# Patient Record
Sex: Male | Born: 1950 | Race: White | Hispanic: No | Marital: Married | Smoking: Former smoker
Health system: Southern US, Community
[De-identification: ages and names within clinical notes are randomized; demographics above are authoritative.]

## PROBLEM LIST (undated history)

## (undated) DIAGNOSIS — A770 Spotted fever due to Rickettsia rickettsii: Secondary | ICD-10-CM

## (undated) DIAGNOSIS — E785 Hyperlipidemia, unspecified: Secondary | ICD-10-CM

## (undated) DIAGNOSIS — R011 Cardiac murmur, unspecified: Secondary | ICD-10-CM

## (undated) DIAGNOSIS — D649 Anemia, unspecified: Secondary | ICD-10-CM

## (undated) DIAGNOSIS — K5792 Diverticulitis of intestine, part unspecified, without perforation or abscess without bleeding: Secondary | ICD-10-CM

## (undated) DIAGNOSIS — I499 Cardiac arrhythmia, unspecified: Secondary | ICD-10-CM

## (undated) DIAGNOSIS — I10 Essential (primary) hypertension: Secondary | ICD-10-CM

## (undated) DIAGNOSIS — D126 Benign neoplasm of colon, unspecified: Secondary | ICD-10-CM

## (undated) DIAGNOSIS — K5790 Diverticulosis of intestine, part unspecified, without perforation or abscess without bleeding: Secondary | ICD-10-CM

## (undated) DIAGNOSIS — K4090 Unilateral inguinal hernia, without obstruction or gangrene, not specified as recurrent: Secondary | ICD-10-CM

## (undated) HISTORY — DX: Anemia, unspecified: D64.9

## (undated) HISTORY — DX: Benign neoplasm of colon, unspecified: D12.6

## (undated) HISTORY — DX: Spotted fever due to Rickettsia rickettsii: A77.0

## (undated) HISTORY — DX: Cardiac murmur, unspecified: R01.1

## (undated) HISTORY — DX: Essential (primary) hypertension: I10

## (undated) HISTORY — DX: Diverticulitis of intestine, part unspecified, without perforation or abscess without bleeding: K57.92

## (undated) HISTORY — PX: THROAT SURGERY: SHX803

## (undated) HISTORY — DX: Diverticulosis of intestine, part unspecified, without perforation or abscess without bleeding: K57.90

## (undated) HISTORY — DX: Hyperlipidemia, unspecified: E78.5

## (undated) HISTORY — DX: Cardiac arrhythmia, unspecified: I49.9

## (undated) HISTORY — PX: VASECTOMY: SHX75

## (undated) HISTORY — DX: Unilateral inguinal hernia, without obstruction or gangrene, not specified as recurrent: K40.90

---

## 1997-11-21 DIAGNOSIS — I480 Paroxysmal atrial fibrillation: Secondary | ICD-10-CM

## 1997-11-21 HISTORY — DX: Paroxysmal atrial fibrillation: I48.0

## 2002-11-21 HISTORY — PX: OTHER SURGICAL HISTORY: SHX169

## 2003-04-12 ENCOUNTER — Inpatient Hospital Stay (HOSPITAL_COMMUNITY): Admission: AD | Admit: 2003-04-12 | Discharge: 2003-04-13 | Payer: Self-pay | Admitting: Cardiology

## 2003-04-12 ENCOUNTER — Encounter: Payer: Self-pay | Admitting: Cardiology

## 2003-11-22 HISTORY — PX: HEMORRHOID SURGERY: SHX153

## 2004-05-21 DIAGNOSIS — A770 Spotted fever due to Rickettsia rickettsii: Secondary | ICD-10-CM

## 2004-05-21 HISTORY — DX: Spotted fever due to Rickettsia rickettsii: A77.0

## 2004-07-12 ENCOUNTER — Ambulatory Visit (HOSPITAL_COMMUNITY): Admission: RE | Admit: 2004-07-12 | Discharge: 2004-07-12 | Payer: Self-pay | Admitting: Internal Medicine

## 2004-10-27 ENCOUNTER — Encounter (INDEPENDENT_AMBULATORY_CARE_PROVIDER_SITE_OTHER): Payer: Self-pay | Admitting: *Deleted

## 2004-10-27 ENCOUNTER — Ambulatory Visit (HOSPITAL_COMMUNITY): Admission: RE | Admit: 2004-10-27 | Discharge: 2004-10-27 | Payer: Self-pay | Admitting: *Deleted

## 2009-11-10 LAB — HEPATIC FUNCTION PANEL: AST: 17 U/L (ref 14–40)

## 2009-11-10 LAB — LIPID PANEL: Cholesterol: 193 mg/dL (ref 0–200)

## 2010-09-29 ENCOUNTER — Encounter: Admission: RE | Admit: 2010-09-29 | Discharge: 2010-09-29 | Payer: Self-pay | Admitting: Emergency Medicine

## 2010-10-11 ENCOUNTER — Encounter (INDEPENDENT_AMBULATORY_CARE_PROVIDER_SITE_OTHER): Payer: Self-pay | Admitting: *Deleted

## 2010-11-10 LAB — BASIC METABOLIC PANEL: Creatinine: 0.9 mg/dL (ref <=1.3)

## 2010-11-10 LAB — HEPATIC FUNCTION PANEL: ALT: 20 U/L (ref 10–40)

## 2010-11-17 ENCOUNTER — Encounter (INDEPENDENT_AMBULATORY_CARE_PROVIDER_SITE_OTHER): Payer: Self-pay | Admitting: *Deleted

## 2010-11-18 ENCOUNTER — Ambulatory Visit: Payer: Self-pay | Admitting: Internal Medicine

## 2010-12-02 ENCOUNTER — Ambulatory Visit
Admission: RE | Admit: 2010-12-02 | Discharge: 2010-12-02 | Payer: Self-pay | Source: Home / Self Care | Attending: Internal Medicine | Admitting: Internal Medicine

## 2010-12-02 ENCOUNTER — Encounter: Payer: Self-pay | Admitting: Internal Medicine

## 2010-12-02 LAB — HM COLONOSCOPY

## 2010-12-07 ENCOUNTER — Encounter: Payer: Self-pay | Admitting: Internal Medicine

## 2010-12-21 NOTE — Letter (Signed)
Summary: Pre Visit Letter Revised  [facility]  [address] [place], [place] [address]   Phone: [phone]  Fax: [phone]        10/11/2010 MRN: [id number] [facility] [address] [place], [place]  [address]             Procedure Date:  12-02-10   Welcome to the Gastroenterology Division at [facility].    You are scheduled to see a nurse for your pre-procedure visit on 11-18-10 at 8:00a.m. on the 3rd floor at [facility], [address]  We ask that you try to arrive at our office 15 minutes prior to your appointment time to allow for check-in.  Please take a minute to review the attached form.  If you answer "Yes" to one or more of the questions on the first page, we ask that you call the person listed at your earliest opportunity.  If you answer "No" to all of the questions, please complete the rest of the form and bring it to your appointment.    Your nurse visit will consist of discussing your medical and surgical history, your immediate family medical history, and your medications.   If you are unable to list all of your medications on the form, please bring the medication bottles to your appointment and we will list them.  We will need to be aware of both prescribed and over the counter drugs.  We will need to know exact dosage information as well.    Please be prepared to read and sign documents such as consent forms, a financial agreement, and acknowledgement forms.  If necessary, and with your consent, a friend or relative is welcome to sit-in on the nurse visit with you.  Please bring your insurance card so that we may make a copy of it.  If your insurance requires a referral to see a specialist, please bring your referral form from your primary care physician.  No co-pay is required for this nurse visit.     If you cannot keep your appointment, please call [phone] to cancel or reschedule prior to your appointment date.   This allows [place] the opportunity to schedule an appointment for another patient in need of care.    Thank you for choosing [facility] for your medical needs.  We appreciate the opportunity to care for you.  Please visit [place] at our website  to learn more about our practice.  Sincerely, The Gastroenterology Division

## 2010-12-23 NOTE — Letter (Signed)
Summary: [name] Instructions  [facility]  [address] [place], [place] [address]   Phone: [phone]  Fax: [phone]       Alec Wilson    [birth year]    MRN: [id number]        Procedure Day [name]:  [name]  [birth year]     Arrival Time:  9:30AM     Procedure Time:  10:30AM     [facility] of Procedure:                    [name]                      PREPARATION FOR COLONOSCOPY WITH MOVIPREP   Starting 5 days prior to your procedure 11/27/10 do not eat nuts, seeds, popcorn, corn, beans, peas,  salads, or any raw vegetables.  Do not take any fiber supplements (e.g. Metamucil, Citrucel, and Benefiber).  THE DAY BEFORE YOUR PROCEDURE         DATE: 12/01/10  DAY: WEDNESDAY  1.  Drink clear liquids the entire day-NO SOLID FOOD  2.  Do not drink anything colored red or purple.  Avoid juices with pulp.  No orange juice.  3.  Drink at least 64 oz. (8 glasses) of fluid/clear liquids during the day to prevent dehydration and help the prep work efficiently.  CLEAR LIQUIDS INCLUDE: Water Jello Ice Popsicles Tea (sugar ok, no milk/cream) Powdered fruit flavored drinks Coffee (sugar ok, no milk/cream) Gatorade Juice: apple, white grape, white cranberry  Lemonade Clear bullion, consomm, broth Carbonated beverages (any kind) Strained chicken noodle soup Hard Candy                             4.  In the morning, mix first dose of MoviPrep solution:    Empty 58 Pouch A and 58 Pouch B into the disposable container    Add lukewarm drinking water to the top line of the container. Mix to dissolve    Refrigerate (mixed solution should be used within 24 hrs)  5.  Begin drinking the prep at 5:00 p.m. The MoviPrep container is divided by 4 marks.   Every 15 minutes drink the solution down to the next mark (approximately 8 oz) until the full liter is complete.   6.  Follow completed prep with 16 oz of clear liquid of your choice (Nothing  red or purple).  Continue to drink clear liquids until bedtime.  7.  Before going to bed, mix second dose of MoviPrep solution:    Empty 58 Pouch A and 58 Pouch B into the disposable container    Add lukewarm drinking water to the top line of the container. Mix to dissolve    Refrigerate  THE DAY OF YOUR PROCEDURE      DATE: 12/02/10    DAY: THURSDAY  Beginning at 5:30AM (5 hours before procedure):         1. Every 15 minutes, drink the solution down to the next mark (approx 8 oz) until the full liter is complete.  2. Follow completed prep with 16 oz. of clear liquid of your choice.    3. You may drink clear liquids until 8:30AM (2 HOURS BEFORE PROCEDURE).   MEDICATION INSTRUCTIONS  Unless otherwise instructed, you should take regular prescription medications with a small sip of water   as early as possible the morning  of your procedure.        OTHER INSTRUCTIONS  You will need a responsible adult at least 58 years of age to accompany you and drive you home.   This person must remain in the waiting room during your procedure.  Wear loose fitting clothing that is easily removed.  Leave jewelry and other valuables at home.  However, you may wish to bring a book to read or  an iPod/MP3 player to listen to music as you wait for your procedure to start.  Remove all body piercing jewelry and leave at home.  Total time from sign-in until discharge is approximately 2-3 hours.  You should go home directly after your procedure and rest.  You can resume normal activities the  day after your procedure.  The day of your procedure you should not:   Drive   Make legal decisions   Operate machinery   Drink alcohol   Return to work  You will receive specific instructions about eating, activities and medications before you leave.    The above instructions have been reviewed and explained to me by  [name]  November 18, 2010 8:07 AM     I fully understand  and can verbalize these instructions _____________________________ Date _________

## 2010-12-23 NOTE — Miscellaneous (Signed)
Summary: [facility] previsit  Clinical Lists Changes  Medications: Added new medication of MOVIPREP 100 GM  SOLR (PEG-KCL-NACL-NASULF-NA ASC-C) As per prep instructions. - Signed Rx of MOVIPREP 100 GM  SOLR (PEG-KCL-NACL-NASULF-NA ASC-C) As per prep instructions.;  #1 x 0;  Signed;  Entered by: [name];  Authorized by: Iva Boop MD, FACG;  Method used: Electron[facility] to [facility]  [address] [place], [place]  [address], Ph: [phone] or [phone], Fax: [phone] Observations: Added new observation of NKA: T (11/18/2010 7:48)    Prescriptions: MOVIPREP 100 GM  SOLR (PEG-KCL-NACL-NASULF-NA ASC-C) As per prep instructions.  #1 x 0   Entered by:   [name]   Authorized by:   Iva Boop MD, [facility]   Signed by:   [name] on 11/18/2010   Method used:   [facility]ronically to        [facility]  [address] (retail)       [address]       [place], [place]  [address]       Ph: [phone] or [phone]       Fax: [phone]   RxID:   [id number]

## 2010-12-23 NOTE — Letter (Signed)
Summary: Patient Notice- Polyp Results  [facility]  [address] [place], [place] [address]   Phone: [phone]  Fax: [phone]        December 07, 2010 MRN: [id number]    [facility] [address] [place], [place]  [address]    Dear Mr. Buchmann,  The polyps removed from your colon were adenomatous. This means that they were pre-cancerous or that  they had the potential to change into cancer over time.   I recommend that you have a repeat colonoscopy in 3 years to determine if you have developed any new polyps over time and screen for colorectal cancer. If you develop any new rectal bleeding, abdominal pain or significant bowel habit changes, please contact us before then.  In addition to repeating colonoscopy, changing health habits may reduce your risk of having more colon or rectal  polyps and possibly, colorectal cancer. You may lower your risk of future polyps and colorectal cancer by adopting healthy habits such as not smoking or using tobacco (if you do), being physically active, losing weight (if overweight), and eating a diet which includes fruits and vegetables and limits red meat.  Please call us if you are having persistent problems or have questions about your condition that have not been fully answered at this time.  Sincerely,  Iva Boop MD, [facility]  This letter has been electronically signed by your physician.  Appended Document: Patient Notice- Polyp Results Letter mailed

## 2010-12-23 NOTE — Procedures (Signed)
Summary: Colonoscopy  Patient: Alec Wilson Note: All result statuses are Final unless otherwise noted.  Tests: (1) Colonoscopy (COL)   COL Colonoscopy           DONE     [facility]     [address]     [place], [place]  [address]           COLONOSCOPY PROCEDURE REPORT           PATIENT:  Alec, Wilson  MR#:  [id number]     BIRTHDATE:  05-22-1951, 59 yrs. old  GENDER:  male     ENDOSCOPIST:  Iva Boop, MD, [facility]           PROCEDURE DATE:  12/02/2010     PROCEDURE:  Colonoscopy with snare polypectomy     ASA CLASS:  Class II     INDICATIONS:  surveillance and high-risk screening, history of     pre-cancerous (adenomatous) colon polyps Two small adenomas (max     6mm) removed 2004     MEDICATIONS:   Fentanyl 75 mcg IV, Versed 8 mg           DESCRIPTION OF PROCEDURE:   After the risks benefits and     alternatives of the procedure were thoroughly explained, informed     consent was obtained.  Digital rectal exam was performed and     revealed no abnormalities and normal prostate.   The [id number] [id number]     endoscope was introduced through the anus and advanced to the     cecum, which was identified by both the appendix and ileocecal     valve, without limitations.  The quality of the prep was     excellent, using MoviPrep.  The instrument was then slowly     withdrawn as the colon was fully examined. Insertion: 2:17 minutes     Withdrawal: 15:49 minutes     <<PROCEDUREIMAGES>>           FINDINGS:  Five polyps were found. Four right colon polyps (all     diminutive) and one sigmoid (7mm) polyp. Right colon polyps were     snared without cautery. Retrieval was successful. the sigmoid     polyp was snared, then cauterized with monopolar cautery.     Retrieval was successful. Mild diverticulosis was found in the     sigmoid colon.  Otherwise normal colonoscopy. Includes right colon     retroflexion.   Retroflexed views in the rectum revealed internal     and external  hemorrhoids.    The scope was then withdrawn from the     patient and the procedure completed.           COMPLICATIONS:  None     ENDOSCOPIC IMPRESSION:     1) Five polyps removed, largest 7mm     2) Mild diverticulosis in the sigmoid colon     3) Otherwise normal colon     4) Internal and external hemorrhoids     5) Personal history small adenoma (2) removal 2004.           RECOMMENDATIONS:  1) No aspirin or anti-inflammatory medications     for 1 week.           REPEAT EXAM:  In for Colonoscopy, pending biopsy results. Will     notify.           Iva Boop, MD, [facility]  CC:  The Patient     Lesle Chris, MD           n.     eSIGNED:   Iva Boop at 12/02/2010 11:12 AM           [place], [place], [place] [id number]  Note: An exclamation mark (!) indicates a result that was not dispersed into the flowsheet. Document Creation Date: 12/02/2010 11:12 AM _______________________________________________________________________  (1) Order result status: Final Collection or observation date-time: 12/02/2010 11:02 Requested date-time:  Receipt date-time:  Reported date-time:  Referring Physician:   Ordering Physician: Stan Head [phone]) Specimen Source:  Source: Launa Grill Order Number: [id number] Lab site:   Appended Document: Colonoscopy     Procedures Next Due Date:    Colonoscopy: 11/2013

## 2011-04-08 NOTE — Op Note (Signed)
NAME:  Alec Wilson, Alec Wilson NO.:  [id number]   MEDICAL RECORD NO.:  [id number]                   PATIENT TYPE:  AMB   LOCATION:  ENDO                                 FACILITY:  [facility]   PHYSICIAN:  Iva Boop, M.D. [facility]           DATE OF BIRTH:  [birth year]   DATE OF PROCEDURE:  07/12/2004  DATE OF DISCHARGE:                                 OPERATIVE REPORT   PROCEDURE:  1. Esophagogastroduodenoscopy.  2. Flexible sigmoidoscopy.  3. Hemorrhoidal ligation.   PREOPERATIVE DIAGNOSES:  Iron deficiency anemia, with chronic intermittent  rectal bleeding/hematochezia.   POSTOPERATIVE DIAGNOSES:  1. Normal upper gastrointestinal endoscopy from the esophagus to the second     portion of the duodenum, without cause of anemia.  2. Grade 3 internal hemorrhoids, otherwise normal sigmoidoscopy examination     to 20 cm.   RECOMMENDATIONS/PLAN:  1. I will see him back in about three weeks, after his hemorrhoidal     ligation.  2. Sitz baths, Colace, Vicodin and lidocaine jelly as needed for     hemorrhoidal and post-banding care.  3. He is to call if there is fever, bleeding, or severe pain.  4. If this fails to improve things, I think surgical treatment of these     hemorrhoids would be appropriate.   DESCRIPTION OF PROCEDURE:  After an informed consent, the patient was  brought to the endoscopy laboratory.  He was sedated with 100 mcg of  fentanyl and 10 mg of Versed before and during his EGD.  Conscious sedation  protocol was followed.  The Olympus adult video gastroscope was inserted  into the esophagus, stomach and first and second portions of the duodenum.  He was in the left lateral decubitus position.  Topical Cetacaine spray was  applied to the pharynx.  All of these areas inspected were normal.  The  gastroscope was withdrawn.  The patient was turned and a flexible  sigmoidoscopy was performed to 20 cm with the same scope.  A rectal  examination  revealed the prolapsed hemorrhoids.  Photographs were taken.  The dentate line was identified.  A retroflexed examination was performed  with the six-shooter ligating apparatus applied and the three columns of  hemorrhoids were ligated successfully (three bands placed).  There was a  small amount of bleeding, which was  typical.  This was traumatic, but it was minor.  The scope was withdrawn and  there were no immediate complications.  I appreciate the opportunity to care for this patient.                                               Iva Boop, M.D. [facility]    CEG/MEDQ  D:  07/12/2004  T:  07/12/2004  Job:  [id number]  cc:   Stan Head. Cleta Alberts, M.D.  [address]  [place]  [place] [address]  Fax: [phone]

## 2011-04-08 NOTE — Op Note (Signed)
NAMEXAVION, Alec Wilson                ACCOUNT NO.:  [id number]   MEDICAL RECORD NO.:  [id number]          PATIENT TYPE:  AMB   LOCATION:  DAY                          [facility]ILITY[facility]   PHYSICIAN:  Vikki Ports, MDDATE OF BIRTH:  1951-07-03   DATE OF PROCEDURE:  10/27/2004  DATE OF DISCHARGE:                                 OPERATIVE REPORT   PREOPERATIVE DIAGNOSES:  Grade 3 internal hemorrhoids with prolapse.   POSTOPERATIVE DIAGNOSES:  Grade 3 internal hemorrhoids with prolapse.   PROCEDURE:  Procedure for prolapsing hemorrhoids, anopexy, rectopexy.   SURGEON:  Vikki Ports, MD.   ANESTHESIA:  General.   DESCRIPTION OF PROCEDURE:  The patient was taken to the operating room,  placed in the supine position after adequate general anesthesia was induced  using the endotracheal tube, the patient was placed in the prone jackknife  position. Rectal and perianal prep were undertaken.  The three hemorrhoidal  bundles were injected with 0.5 Marcaine and Wydase.  Internal and external  sphincter muscles were injected with an additional 25 mL of 0.25% Marcaine.  Anal dilatation was accomplished to three fingers. A 2-0 Prolene pursestring  suture was placed in the mucosa in a circumferential fashion 5 cm proximal  to the dentate line. The stapler was then placed and the pursestring suture  cinched down around it. The stapler was held in place for 30 seconds after  it had been closed, fired and removed.  I had a rim of approximately 1 1/2  to 2 cm of hemorrhoidal tissue. There was no evidence of muscularis. The  staple line was tediously inspected, there was no bleeding, Gelfoam packing  was placed, patient tolerated the procedure well and was taken to PACU in  good condition.     [name]   KRH/MEDQ  D:  10/27/2004  T:  10/27/2004  Job:  [id number]

## 2011-04-08 NOTE — Discharge Summary (Signed)
Alec Wilson, BROTHERS NO.:  [id number]   MEDICAL RECORD NO.:  [id number]                   PATIENT TYPE:  INP   LOCATION:  2019                                 FACILITY:  [facility]   PHYSICIAN:  Darden Palmer., M.D.         DATE OF BIRTH:  [birth year]   DATE OF ADMISSION:  04/12/2003  DATE OF DISCHARGE:  04/13/2003                                 DISCHARGE SUMMARY   FINAL DIAGNOSES:  1. Chest pain; myocardial infarction ruled out.  2. Abdominal discomfort possible hiatal hernia.  3. Elevation of the glucose without previous diagnosis of diabetes.   HISTORY:  A 60 year old male, had previously been healthy except for a  history of paroxysmal atrial fibrillation and possible mitral valve  prolapse.  He had the onset of dyspnea and mild chest tightness after  walking with his wife.  His symptoms persisted, were associated with some  numbness and tingling of his left arm.  He was seen at Urgent Care and a GI  cocktail was given with some improvement in his symptoms but they recurred  and he was sent here for admission.  A colleague at work had an infarction  earlier in the week which had been of some concern to him.  He previously  had a negative Cardiolite one year ago.  He was admitted to rule out a  myocardial infarction.  Please see the previously dictated History and  Physical for remainder of the details.   HOSPITAL COURSE:  On admission, his initial CPK was 221 but negative MB or  troponin and subsequent CPKs and troponins were all normal.  Chemistry panel  showed a sodium of 137, potassium 4.7, and glucose of 151 which was  nonfasting, BUN of 12, and creatinine of 1.1.  Liver enzymes were normal.  Hemoglobin was 12.6, white count 6000, hematocrit 37.9.  Lipid panel was  drawn but was pending at the time of dictation.  His EKG was normal and a  repeat EKG the next morning was normal.  He was treated with Protonix, beta-  blockers, aspirin,  and Lovenox.  His symptoms resolved and he had no  recurrence of pain overnight and was ambulatory the next day.  He was opted  a discharge to home the next day to continue his workup as an outpatient and  about whether Dr. Deborah Chalk would want to do a catheterization or do another  Cardiolite study on him.   DISPOSITION:  He is discharged at this time in improved condition on aspirin  325 mg daily, Protonix 40 mg daily, Zestril 5 mg daily, nitroglycerin 1/150  sublingual p.r.n.    DISCHARGE INSTRUCTIONS:  He is to limit his activities and is to call Dr.  Deborah Chalk in the morning for an appointment early in the week.  Darden Palmer., M.D.    WST/MEDQ  D:  04/13/2003  T:  04/13/2003  Job:  [id number]   cc:   Colleen Can. Deborah Chalk, M.D.  [address]  [place]  [place] [address]  Fax: [phone]   Stan Head. Cleta Alberts, M.D.  [address]  [place]  [place] [address]  Fax: [phone]

## 2011-04-08 NOTE — H&P (Signed)
NAMEQUANTAY, Alec Wilson NO.:  [id number]   MEDICAL RECORD NO.:  [id number]                   PATIENT TYPE:  INP   LOCATION:  2019                                 FACILITY:  [facility]   PHYSICIAN:  W. Ashley Royalty., M.D.         DATE OF BIRTH:  [birth year]   DATE OF ADMISSION:  04/12/2003  DATE OF DISCHARGE:                                HISTORY & PHYSICAL   HISTORY OF PRESENT ILLNESS:  The patient is a 60 year old male admitted to  room 2019 at [facility] for evaluation of chest discomfort.  The patient  has a prior history of paroxysmal atrial fibrillation and of mitral valve  prolapse and has been followed intermittently by Dr. Deborah Chalk.  He describes  having had a negative Cardiolite scan approximately one year ago.  He was in  his usual state of health and had taken a run yesterday.  This morning he  awakened and went on a walk with his wife but developed mild heaviness and  pressure in his chest associated with increased shortness of breath and  turned around after walking a short distance.  He then continued to have  intermittent pressure and heaviness associated with numbness and tingling in  his arm and went to see the physicians at [facility].  He was given  a GI cocktail, which may have had transient relief, but had recurrence of  pain and also had some dyspepsia at that time.  He has been battling an  upset stomach through the morning.  He continues to complain of some mild  pressure and heaviness and was admitted to the hospital to rule out a  myocardial infarction.  The discomfort has been low-grade, not necessarily  exertionally-related.  He has no associated symptoms of sweating or nausea.  He has not had previous symptoms like this previously.  Denies any  palpitations.   PAST MEDICAL HISTORY:  Remarkable for hypertension.  Previously his  cholesterol was normally low.  He denies any diabetes.   PAST SURGICAL HISTORY:   Throat surgery as a child.   ALLERGIES:  None.   MEDICATIONS:  1. Lisinopril 5 mg daily.  2. Aspirin 81 mg daily.   FAMILY HISTORY:  Mother is 8 and healthy.  Father is 73 and had bypass  grafting in his 81s and had an MI just two years ago.  He has a brother who  has hypertension.  There is no significant other family history of heart  disease other than his father.   SOCIAL HISTORY:  He is a Clinical research associate for [employer].  He and his  wife have been married for over 20 years and have a 5 year old son.  He is  a nonsmoker, drinks rare wine.   REVIEW OF SYSTEMS:  His weight has been stable.  He normally is active,  healthy, and works out.  He denies any eye, ear, nose,  or throat problems.  He denies any pulmonary problems, denies any GU problems.  He normally does  not have dyspepsia or indigestion.  He has occasional left knee arthritis  from where he has run.  He denies any edema or neurologic symptoms.  Other  than as noted above, the remainder of the review of systems is unremarkable.   PHYSICAL EXAMINATION:  GENERAL:  He is a pleasant white male who is in no  acute distress.  VITAL SIGNS:  Blood pressure is currently 140/85, pulse is currently 60.  SKIN:  Warm and dry.  HEENT:  ENT shows him to be balding.  EOMI, PERRLA, CNS clear.  Fundi not  examined.  Pharynx negative.  NECK:  Supple without masses or JVD, thyromegaly, or bruits.  CHEST:  Lungs clear to A&P.  CARDIAC:  Normal S1, S2, no S3 or S4.  I did not appreciate a definite  murmur or click.  ABDOMEN:  Soft and nontender, no masses, hepatosplenomegaly, or __________.  EXTREMITIES:  Femoral pulses 2+.  Peripheral pulses 2+.  No edema noted.   LABORATORY DATA:  Twelve-lead EKG shows sinus bradycardia and is normal.  There is no lab available for review at the time of dictation.   IMPRESSION:  1. Chest discomfort with some typical, other atypical features, which is low-     grade, associated with a normal  electrocardiogram.  Rule out myocardial     ischemia or myocardial infarction.  2. Hypertension, under treatment.  3. Atrial fibrillation, paroxysmal in past.  4. Possible history of mitral valve prolapse.   RECOMMENDATIONS:  1. The patient will be admitted and a myocardial infarction will be ruled     out with serial enzymes and EKG.  2. Begun on Lovenox, aspirin, and beta blockers.  3. Consider either repeat Cardiolite versus catheterization for evaluation.  4. Add Protonix to regimen.                                               Darden Palmer., M.D.    WST/MEDQ  D:  04/12/2003  T:  04/12/2003  Job:  [id number]   cc:   Brett Canales A. Cleta Alberts, M.D.  [address]  [place]  [place] [address]  Fax: [phone]   Colleen Can. Deborah Chalk, M.D.  [address]  [place]  [place] [address]  Fax: [phone]

## 2011-11-22 HISTORY — PX: COLONOSCOPY: SHX174

## 2011-12-01 ENCOUNTER — Encounter (INDEPENDENT_AMBULATORY_CARE_PROVIDER_SITE_OTHER): Payer: BC Managed Care – PPO | Admitting: Family Medicine

## 2011-12-01 DIAGNOSIS — I10 Essential (primary) hypertension: Secondary | ICD-10-CM

## 2011-12-01 DIAGNOSIS — Z23 Encounter for immunization: Secondary | ICD-10-CM

## 2011-12-01 DIAGNOSIS — M542 Cervicalgia: Secondary | ICD-10-CM

## 2011-12-01 DIAGNOSIS — Z0000 Encounter for general adult medical examination without abnormal findings: Secondary | ICD-10-CM

## 2011-12-12 ENCOUNTER — Encounter: Payer: Self-pay | Admitting: Family Medicine

## 2011-12-12 DIAGNOSIS — I499 Cardiac arrhythmia, unspecified: Secondary | ICD-10-CM | POA: Insufficient documentation

## 2011-12-12 DIAGNOSIS — R011 Cardiac murmur, unspecified: Secondary | ICD-10-CM

## 2011-12-12 DIAGNOSIS — I10 Essential (primary) hypertension: Secondary | ICD-10-CM | POA: Insufficient documentation

## 2011-12-12 DIAGNOSIS — K5792 Diverticulitis of intestine, part unspecified, without perforation or abscess without bleeding: Secondary | ICD-10-CM | POA: Insufficient documentation

## 2011-12-12 DIAGNOSIS — E785 Hyperlipidemia, unspecified: Secondary | ICD-10-CM | POA: Insufficient documentation

## 2012-02-01 ENCOUNTER — Encounter: Payer: Self-pay | Admitting: *Deleted

## 2012-05-30 ENCOUNTER — Ambulatory Visit: Payer: BC Managed Care – PPO | Admitting: Family Medicine

## 2012-11-17 ENCOUNTER — Ambulatory Visit (INDEPENDENT_AMBULATORY_CARE_PROVIDER_SITE_OTHER): Payer: 59 | Admitting: Family Medicine

## 2012-11-17 VITALS — BP 145/76 | HR 71 | Temp 98.2°F | Resp 16 | Ht 68.0 in | Wt 191.0 lb

## 2012-11-17 DIAGNOSIS — E785 Hyperlipidemia, unspecified: Secondary | ICD-10-CM

## 2012-11-17 DIAGNOSIS — J40 Bronchitis, not specified as acute or chronic: Secondary | ICD-10-CM

## 2012-11-17 DIAGNOSIS — I10 Essential (primary) hypertension: Secondary | ICD-10-CM

## 2012-11-17 MED ORDER — AZITHROMYCIN 250 MG PO TABS: ORAL_TABLET | ORAL | Status: DC

## 2012-11-17 MED ORDER — LISINOPRIL 10 MG PO TABS: 10.0000 mg | ORAL_TABLET | Freq: Every day | ORAL | Status: DC

## 2012-11-17 MED ORDER — HYDROCODONE-HOMATROPINE 5-1.5 MG/5ML PO SYRP: 5.0000 mL | ORAL_SOLUTION | Freq: Three times a day (TID) | ORAL | Status: DC | PRN

## 2012-11-17 MED ORDER — SIMVASTATIN 20 MG PO TABS: 20.0000 mg | ORAL_TABLET | Freq: Every evening | ORAL | Status: DC

## 2012-11-17 NOTE — Progress Notes (Signed)
Subjective:    Patient ID: Alec Wilson, male    DOB: [birth year], 61 y.o.   MRN: [id number]  HPI  Alec Wilson is a 61 yo male who has had a bad cough for the past 6 wks - initially improved after a month and felt better for a wk then it recurred 3d ago. Cough is productive of thick green sputum, not smoking, no pulm history.  Did have a low grade fever, no chills, no aches, slight nasal conestion, no pharyngitis.  No SHoB or CP.  Cough worse when lays down so is keeping awake at night.  Tried some alka-seltzer plus for cough which helps a little. Did take lisinopril today.  Requests a refill on his lisinopril and simvastatin - was told he would need OV before refill.  Past Medical History  Diagnosis Date  . HTN (hypertension)   . Atrial Fibrillation     Stress ECHO-Negative (09/22/98)  . Hyperlipidemia   . Diverticulitis   . 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 Anemia   . Hemorrhoids summer 2005    s/p ligation of internal hemorrhoids   Current Outpatient Prescriptions on File Prior to Visit  Medication Sig Dispense Refill  . fish oil-omega-3 fatty acids 1000 MG capsule Take 1 g by mouth daily.      . Multiple Vitamins-Minerals (MULTIVITAMIN PO) Take 1 Centrum Silver multivitamin by mouth daily      . simvastatin (ZOCOR) 20 MG tablet Take 1 tablet (20 mg total) by mouth every evening.  30 tablet  1  . vitamin E 400 UNIT capsule Take 400 Units by mouth daily.       No Known Allergies    Review of Systems  Constitutional: Positive for fever and fatigue. Negative for chills, diaphoresis and activity change.  HENT: Positive for congestion. Negative for ear pain, sore throat, rhinorrhea, trouble swallowing, postnasal drip and sinus pressure.   Respiratory: Positive for cough. Negative for chest tightness and shortness of breath.   Cardiovascular: Negative for chest pain.  Gastrointestinal: Negative for nausea, vomiting and abdominal pain.  Musculoskeletal: Negative for  myalgias and arthralgias.  Psychiatric/Behavioral: Positive for sleep disturbance.      BP 145/76  Pulse 71  Temp 98.2 F (36.8 C)  Resp 16  Ht 5\' 8"  (1.727 m)  Wt 191 lb (86.637 kg)  BMI 29.04 kg/m2 Objective:   Physical Exam  Constitutional: He is oriented to person, place, and time. He appears well-developed and well-nourished. No distress.  HENT:  Head: Normocephalic and atraumatic.  Right Ear: Tympanic membrane, external ear and ear canal normal.  Left Ear: Tympanic membrane, external ear and ear canal normal.  Nose: Nose normal.  Mouth/Throat: Oropharynx is clear and moist and mucous membranes are normal. No oropharyngeal exudate.  Eyes: Conjunctivae normal are normal. No scleral icterus.  Neck: Normal range of motion. Neck supple. No thyromegaly present.  Cardiovascular: Normal rate, regular rhythm, normal heart sounds and intact distal pulses.   Pulmonary/Chest: Effort normal and breath sounds normal. No respiratory distress.  Musculoskeletal: He exhibits no edema.  Lymphadenopathy:    He has no cervical adenopathy.  Neurological: He is alert and oriented to person, place, and time.  Skin: Skin is warm and dry. He is not diaphoretic. No erythema.  Psychiatric: He has a normal mood and affect. His behavior is normal.       Assessment & Plan:  Fast track card given for bp check and flp w/ cmp in 2-  4wks 1. Hyperlipidemia - not fasting, due for labs, get at f/u OV simvastatin (ZOCOR) 20 MG tablet  2. HTN (hypertension) - elevated BP today may be due to acute illness and discomfort so recheck in 2-4 wks and increase lisinopril if still elevated lisinopril (PRINIVIL,ZESTRIL) 10 MG tablet  3. Bronchitis  azithromycin (ZITHROMAX) 250 MG tablet, HYDROcodone-homatropine (HYCODAN) 5-1.5 MG/5ML syrup

## 2013-01-18 ENCOUNTER — Other Ambulatory Visit: Payer: Self-pay | Admitting: Family Medicine

## 2013-01-22 ENCOUNTER — Encounter: Payer: Self-pay | Admitting: Emergency Medicine

## 2013-01-22 ENCOUNTER — Ambulatory Visit (INDEPENDENT_AMBULATORY_CARE_PROVIDER_SITE_OTHER): Payer: 59 | Admitting: Emergency Medicine

## 2013-01-22 VITALS — BP 132/72 | HR 67 | Temp 97.9°F | Resp 16 | Ht 68.0 in | Wt 188.0 lb

## 2013-01-22 DIAGNOSIS — E785 Hyperlipidemia, unspecified: Secondary | ICD-10-CM

## 2013-01-22 DIAGNOSIS — R21 Rash and other nonspecific skin eruption: Secondary | ICD-10-CM

## 2013-01-22 DIAGNOSIS — K4090 Unilateral inguinal hernia, without obstruction or gangrene, not specified as recurrent: Secondary | ICD-10-CM

## 2013-01-22 DIAGNOSIS — Z0000 Encounter for general adult medical examination without abnormal findings: Secondary | ICD-10-CM

## 2013-01-22 DIAGNOSIS — I10 Essential (primary) hypertension: Secondary | ICD-10-CM

## 2013-01-22 DIAGNOSIS — K921 Melena: Secondary | ICD-10-CM

## 2013-01-22 HISTORY — DX: Unilateral inguinal hernia, without obstruction or gangrene, not specified as recurrent: K40.90

## 2013-01-22 LAB — COMPREHENSIVE METABOLIC PANEL
ALT: 20 U/L (ref 0–53)
AST: 18 U/L (ref 0–37)
Albumin: 4.4 g/dL (ref 3.5–5.2)
Alkaline Phosphatase: 93 U/L (ref 39–117)
BUN: 15 mg/dL (ref 6–23)
CO2: 26 mEq/L (ref 19–32)
Calcium: 9.7 mg/dL (ref 8.4–10.5)
Chloride: 101 mEq/L (ref 96–112)
Creat: 0.9 mg/dL (ref 0.50–1.35)
Glucose, Bld: 82 mg/dL (ref 70–99)
Potassium: 4.1 mEq/L (ref 3.5–5.3)
Sodium: 137 mEq/L (ref 135–145)
Total Bilirubin: 0.7 mg/dL (ref 0.3–1.2)
Total Protein: 7.1 g/dL (ref 6.0–8.3)

## 2013-01-22 LAB — IFOBT (OCCULT BLOOD): IFOBT: POSITIVE

## 2013-01-22 LAB — CBC WITH DIFFERENTIAL/PLATELET
Basophils Absolute: 0 10*3/uL (ref 0.0–0.1)
Basophils Relative: 0 % (ref 0–1)
Eosinophils Absolute: 0.4 10*3/uL (ref 0.0–0.7)
Eosinophils Relative: 7 % — ABNORMAL HIGH (ref 0–5)
Hemoglobin: 15.8 g/dL (ref 13.0–17.0)
MCHC: 35.6 g/dL (ref 30.0–36.0)
Monocytes Absolute: 0.4 10*3/uL (ref 0.1–1.0)
Monocytes Relative: 6 % (ref 3–12)
Neutro Abs: 4.2 10*3/uL (ref 1.7–7.7)
Neutrophils Relative %: 67 % (ref 43–77)
Platelets: 284 10*3/uL (ref 150–400)
RBC: 4.89 MIL/uL (ref 4.22–5.81)
RDW: 14.2 % (ref 11.5–15.5)

## 2013-01-22 LAB — POCT UA - MICROSCOPIC ONLY
Casts, Ur, LPF, POC: NEGATIVE
Crystals, Ur, HPF, POC: NEGATIVE
RBC, urine, microscopic: NEGATIVE
WBC, Ur, HPF, POC: NEGATIVE
Yeast, UA: NEGATIVE

## 2013-01-22 LAB — POCT URINALYSIS DIPSTICK
Blood, UA: NEGATIVE
Glucose, UA: NEGATIVE
Ketones, UA: 15
Leukocytes, UA: NEGATIVE
Protein, UA: NEGATIVE
Spec Grav, UA: 1.015
Urobilinogen, UA: 0.2
pH, UA: 5.5

## 2013-01-22 LAB — LIPID PANEL
Cholesterol: 184 mg/dL (ref 0–200)
LDL Cholesterol: 88 mg/dL (ref 0–99)
Total CHOL/HDL Ratio: 2.6 Ratio
VLDL: 25 mg/dL (ref 0–40)

## 2013-01-22 LAB — PSA: PSA: 1.27 ng/mL (ref <=4.00)

## 2013-01-22 LAB — TSH: TSH: 2.283 u[IU]/mL (ref 0.350–4.500)

## 2013-01-22 LAB — POCT SKIN KOH: Skin KOH, POC: POSITIVE

## 2013-01-22 MED ORDER — LISINOPRIL 10 MG PO TABS: 10.0000 mg | ORAL_TABLET | Freq: Every day | ORAL | Status: DC

## 2013-01-22 MED ORDER — SIMVASTATIN 20 MG PO TABS: 20.0000 mg | ORAL_TABLET | Freq: Every day | ORAL | Status: DC

## 2013-01-22 NOTE — Progress Notes (Signed)
@[email or web address] @  Patient ID: Alec Wilson MRN: [id number], DOB: Jul 16, 1951 62 y.o. Date of Encounter: 01/22/2013, 1:50 PM  Primary Physician: Lucilla Edin, MD  Chief Complaint: Physical (CPE)  HPI: 62 y.o. y/o male with history noted below here for CPE.  Doing well. No issues/complaints.  Review of Systems:  Consitutional: No fever, chills, fatigue, night sweats, lymphadenopathy, or weight changes. Eyes: No visual changes, eye redness, or discharge. ENT/Mouth: Ears: No otalgia, tinnitus, hearing loss, discharge. Nose: No congestion, rhinorrhea, sinus pain, or epistaxis. Throat: No sore throat, post nasal drip, or teeth pain. Head:Sinus pressure Cardiovascular: No CP, palpitations, diaphoresis, DOE, edema, orthopnea, PND. Respiratory: No cough, hemoptysis, SOB, or wheezing. Gastrointestinal: No anorexia, dysphagia, reflux, pain, nausea, vomiting, hematemesis, diarrhea, constipation, BRBPR, or melena. Genitourinary: No dysuria, frequency, urgency, hematuria, incontinence, nocturia, decreased urinary stream, discharge, impotence, or testicular pain/masses. Musculoskeletal: No decreased ROM, myalgias, stiffness, joint swelling, or weakness. Positive back pain. Skin: No rash, erythema, lesion changes, pain, warmth, jaundice, or pruritis. Neurological: No headache, dizziness, syncope, seizures, tremors, memory loss, coordination problems, or paresthesias. Psychological: No anxiety, depression, hallucinations, SI/HI. Endocrine: No fatigue, polydipsia, polyphagia, polyuria, or known diabetes. All other systems were reviewed and are otherwise negative.  Past Medical History  Diagnosis Date  . HTN (hypertension)   . Atrial Fibrillation     Stress ECHO-Negative (09/22/98)  . Hyperlipidemia   . Diverticulitis   . 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 Anemia   . Hemorrhoids summer 2005    s/p ligation of internal hemorrhoids     Past Surgical History  Procedure Laterality Date  .  Throat surgery  childhood  . Hemorrhoid surgery  2005  . Cardiolite study  2004    negative  . Vasectomy      Home Meds:  Prior to Admission medications   Medication Sig Start Date End Date Taking? Authorizing Cumi Sanagustin  fish oil-omega-3 fatty acids 1000 MG capsule Take 1 g by mouth daily.   Yes Historical Esaw Knippel, MD  lisinopril (PRINIVIL,ZESTRIL) 10 MG tablet Take 1 tablet (10 mg total) by mouth daily. Needs office visit 01/18/13  Yes Nelva Nay, PA-C  Multiple Vitamins-Minerals (MULTIVITAMIN PO) Take 1 Centrum Silver multivitamin by mouth daily   Yes Historical Ieshia Hatcher, MD  simvastatin (ZOCOR) 20 MG tablet Take 1 tablet (20 mg total) by mouth at bedtime. Needs office visit 01/18/13  Yes Nelva Nay, PA-C  vitamin E 400 UNIT capsule Take 400 Units by mouth daily.   Yes Historical Ryzen Deady, MD  azithromycin (ZITHROMAX) 250 MG tablet Take 2 tabs PO x 1 dose, then 1 tab PO QD x 4 days 11/17/12   Sherren Mocha, MD  HYDROcodone-homatropine [facility]) 5-1.5 MG/5ML syrup Take 5 mLs by mouth every 8 (eight) hours as needed for cough. 11/17/12   Sherren Mocha, MD    Allergies: No Known Allergies  History   Social History  . Marital Status: Married    Spouse Name: N/A    Number of Children: N/A  . Years of Education: N/A   Occupational History  . Not on file.   Social History Main Topics  . Smoking status: Former Smoker -- 1.00 packs/day    Types: Cigarettes  . Smokeless tobacco: Not on file  . Alcohol Use: Yes  . Drug Use: No  . Sexually Active: Yes    Birth Control/ Protection: Surgical   Other Topics Concern  . Not on file   Social History Narrative  . No  narrative on file    Family History  Problem Relation Age of Onset  . Coronary artery disease Father 91    s/p MI in earlly 70s  . Heart disease Father   . Parkinson's disease Father   . Hypertension Brother   . Heart disease Mother   . Hypertension Mother   . Arthritis Mother   . Cancer Maternal Grandmother    . Cancer Maternal Grandfather   . Heart attack Paternal Grandfather     Physical Exam:  Blood pressure 132/72, pulse 67, temperature 97.9 F (36.6 C), temperature source Oral, resp. rate 16, height 5\' 8"  (1.727 m), weight 188 lb (85.276 kg), SpO2 99.00%.  General: Well developed, well nourished, in no acute distress. HEENT: Normocephalic, atraumatic. Conjunctiva pink, sclera non-icteric. Pupils 2 mm constricting to 1 mm, round, regular, and equally reactive to light and accomodation. EOMI. Internal auditory canal clear. TMs with good cone of light and without pathology. Nasal mucosa pink. Nares are without discharge. No sinus tenderness. Oral mucosa pink. Dentition . Pharynx without exudate.   Neck: Supple. Trachea midline. No thyromegaly. Full ROM. No lymphadenopathy. Lungs: Clear to auscultation bilaterally without wheezes, rales, or rhonchi. Breathing is of normal effort and unlabored. Cardiovascular: RRR with S1 S2. No murmurs, rubs, or gallops appreciated. Distal pulses 2+ symmetrically. No carotid or abdominal bruits.  Abdomen: Soft, non-tender, non-distended with normoactive bowel sounds. No hepatosplenomegaly or masses. No rebound/guarding. No CVA tenderness. He has a large right inguinal hernia  Rectal: No external hemorrhoids or fissures. Rectal vault without masses.   Genitourinary:   circumcised male. No penile lesions. Testes descended bilaterally, and smooth without tenderness or masses.  Musculoskeletal: Full range of motion and 5/5 strength throughout. Without swelling, atrophy, tenderness, crepitus, or warmth. Extremities without clubbing, cyanosis, or edema. Calves supple. Skin: There is a raised red scaly rash on the right side . This area involves the medial side of the right buttocks. It does not extend to the anal area.  Neuro: A+Ox3. CN II-XII grossly intact. Moves all extremities spontaneously. Full sensation throughout. Normal gait. DTR 2+ throughout upper and lower  extremities. Finger to nose intact. Psych:  Responds to questions appropriately with a normal affect.  EKG normal  Results for orders placed in visit on 01/22/13  IFOBT (OCCULT BLOOD)      Result Value Range   IFOBT Positive    POCT SKIN KOH      Result Value Range   Skin KOH, POC Positive    POCT URINALYSIS DIPSTICK      Result Value Range   Color, UA yellow     Clarity, UA clear     Glucose, UA neg     Bilirubin, UA neg     Ketones, UA 15     Spec Grav, UA 1.015     Blood, UA neg     pH, UA 5.5     Protein, UA neg     Urobilinogen, UA 0.2     Nitrite, UA neg     Leukocytes, UA Negative    POCT UA - MICROSCOPIC ONLY      Result Value Range   WBC, Ur, HPF, POC neg     RBC, urine, microscopic neg     Bacteria, U Microscopic neg     Mucus, UA trace     Epithelial cells, urine per micros 0-1     Crystals, Ur, HPF, POC neg     Casts, Ur, LPF, POC neg  Yeast, UA neg     Studies: CBC, CMET, Lipid, PSA, TSH,    Assessment/Plan:  62 y.o. enters for a complete physical. He's been released by his cardiologist to he has a significant right inguinal hernia which needs to be repaired. There is a dry scaly reddened area on the right buttocks we'll check a KOH for this. KOH was positive for fungus so we'll treat this with Lotrimin. His stool did have blood in his specimen so we'll go ahead and refer him back to his GI.  -  Signed, Earl Lites, MD 01/22/2013 1:50 PM

## 2013-01-23 ENCOUNTER — Encounter: Payer: Self-pay | Admitting: *Deleted

## 2013-01-28 ENCOUNTER — Ambulatory Visit (INDEPENDENT_AMBULATORY_CARE_PROVIDER_SITE_OTHER): Payer: 59 | Admitting: General Surgery

## 2013-02-06 ENCOUNTER — Encounter (INDEPENDENT_AMBULATORY_CARE_PROVIDER_SITE_OTHER): Payer: Self-pay | Admitting: General Surgery

## 2013-02-15 ENCOUNTER — Ambulatory Visit (INDEPENDENT_AMBULATORY_CARE_PROVIDER_SITE_OTHER): Payer: 59 | Admitting: Internal Medicine

## 2013-02-15 ENCOUNTER — Encounter: Payer: Self-pay | Admitting: Internal Medicine

## 2013-02-15 VITALS — BP 128/72 | HR 70 | Ht 68.0 in | Wt 189.0 lb

## 2013-02-15 DIAGNOSIS — K648 Other hemorrhoids: Secondary | ICD-10-CM

## 2013-02-15 HISTORY — DX: Other hemorrhoids: K64.8

## 2013-02-15 NOTE — Progress Notes (Signed)
  Subjective:    Patient ID: Alec Wilson, male    DOB: [birth year], 62 y.o.   MRN: [id number]  HPI Here for rectal bleeding and iFOBT + stool Has hemorrhoids Bowel movements are irregular since changing jobs and diet - less exercise Had been lifting weights  Medications, allergies, past medical history, past surgical history, family history and social history are reviewed and updated in the EMR.  Review of Systems As above    Objective:   Physical Exam General:  NAD Rectal:  Visible protruding hemorrhoids - reducible, palpable in canal, no masses, no stool Prostate:  normal  Lab Results  Component Value Date   WBC 6.3 01/22/2013   HGB 15.8 01/22/2013   HCT 44.4 01/22/2013   MCV 90.8 01/22/2013   PLT 284 01/22/2013       Assessment & Plan:  Internal hemorrhoids with bleeding and prolapse   1. Hemorrhoid education, reduce straining, sitting 2. Psyllium supplement and high fiber diet 3. Mention hemorrhoids to Dr.Rosenbower at visit 4/1 (for hernia) - can discuss possible treatments (ligation? Injection?)  4. Would not pursue endoscopic evaluation at this time had colonoscopy 2012 w/ 5 polyps - due again 2015 - if hemorrhods treated and persitently bleeds than may need one  ZD:GUYQ, Stan Head, MD Avel Peace, MD

## 2013-02-15 NOTE — Patient Instructions (Addendum)
Take psyllium every day, work your way up to 1 tablespoon over a week or so.   We are giving you handouts to read and follow on hemorrhoids and high fiber diet .  Please tell Dr. Abbey Chatters about your hemorrhoids when you see him.  Thank you for choosing me and [facility].  Iva Boop, M.D., [facility]

## 2013-02-19 ENCOUNTER — Other Ambulatory Visit (INDEPENDENT_AMBULATORY_CARE_PROVIDER_SITE_OTHER): Payer: Self-pay | Admitting: General Surgery

## 2013-02-19 ENCOUNTER — Ambulatory Visit (INDEPENDENT_AMBULATORY_CARE_PROVIDER_SITE_OTHER): Payer: 59 | Admitting: General Surgery

## 2013-02-19 ENCOUNTER — Encounter (INDEPENDENT_AMBULATORY_CARE_PROVIDER_SITE_OTHER): Payer: Self-pay | Admitting: General Surgery

## 2013-02-19 VITALS — BP 138/86 | HR 68 | Temp 97.2°F | Resp 16 | Ht 70.0 in | Wt 191.2 lb

## 2013-02-19 DIAGNOSIS — K4090 Unilateral inguinal hernia, without obstruction or gangrene, not specified as recurrent: Secondary | ICD-10-CM

## 2013-02-19 NOTE — Patient Instructions (Signed)
Work on the constipation as we discussed.  Call [phone] when you want to set up your surgery date.  [facility] _______[facility]  UMBILICAL OR INGUINAL HERNIA REPAIR: POST OP INSTRUCTIONS  Always review your discharge instruction sheet given to you by the facility where your surgery was performed. IF YOU HAVE DISABILITY OR FAMILY LEAVE FORMS, YOU MUST BRING THEM TO THE OFFICE FOR PROCESSING.   DO NOT GIVE THEM TO YOUR DOCTOR.  1. A  prescription for pain medication may be given to you upon discharge.  Take your pain medication as prescribed, if needed.  If narcotic pain medicine is not needed, then you may take acetaminophen (Tylenol) or ibuprofen (Advil) as needed. 2. Take your usually prescribed medications unless otherwise directed. 3. If you need a refill on your pain medication, please contact your pharmacy.  They will contact our office to request authorization. Prescriptions will not be filled after 5 pm or on week-ends. 4. You should follow a light diet the first 24 hours after arrival home, such as soup and crackers, etc.  Be sure to include lots of fluids daily.  Resume your normal diet the day after surgery. 5. Most patients will experience some swelling and bruising around the umbilicus or in the groin and scrotum.  Ice packs and reclining will help.  Swelling and bruising can take several days to resolve.  6. It is common to experience some constipation if taking pain medication after surgery.  Increasing fluid intake and taking a stool softener (such as Colace) will usually help or prevent this problem from occurring.  A mild laxative (Milk of Magnesia or Miralax) should be taken according to package directions if there are no bowel movements after 48 hours. 7. Unless discharge instructions indicate otherwise, you may remove your bandages 24-48 hours after surgery, and you may shower at that time.  You may have steri-strips (small skin tapes) in place directly over the  incision.  These strips should be left on the skin for 7-10 days.  If your surgeon used skin glue on the incision, you may shower in 24 hours.  The glue will flake off over the next 2-3 weeks.  Any sutures or staples will be removed at the office during your follow-up visit. 8. ACTIVITIES:  You may resume regular (light) daily activities beginning the next day-such as daily self-care, walking, climbing stairs-gradually increasing activities as tolerated.  You may have sexual intercourse when it is comfortable.  Refrain from any heavy lifting or straining until approved by your doctor. a. You may drive when you are no longer taking prescription pain medication, you can comfortably wear a seatbelt, and you can safely maneuver your car and apply brakes. b. RETURN TO WORK:  __________________________________________________________ 9. You should see your doctor in the office for a follow-up appointment approximately 2-3 weeks after your surgery.  Make sure that you call for this appointment within a day or two after you arrive home to insure a convenient appointment time. 10. OTHER INSTRUCTIONS:  __________________________________________________________________________________________________________________________________________________________________________________________  WHEN TO CALL YOUR DOCTOR: 1. Fever over 101.0 2. Inability to urinate 3. Nausea and/or vomiting 4. Extreme swelling or bruising 5. Continued bleeding from incision. 6. Increased pain, redness, or drainage from the incision  The clinic staff is available to answer your questions during regular business hours.  Please don't hesitate to call and ask to speak to one of the nurses for clinical concerns.  If you have a medical emergency, go to the nearest emergency  room or call 911.  A surgeon from [facility] is always on call at the hospital   [address], [place], [place]  [address] ?  [address]  [address], [place], [place]   [address] [phone] ? [phone] ? FAX [phone] Web site: [email or web address]

## 2013-02-19 NOTE — Progress Notes (Signed)
Patient ID: Alec Wilson, male   DOB: March 05, 1951, 62 y.o.   MRN: [id number]  Chief Complaint  Patient presents with  . New Evaluation    eval RIH    HPI Alec Wilson is a 62 y.o. male.   HPI  He is referred by Dr. Cleta Alberts for evaluation of a right inguinal hernia.  He has known about this for a number of years but recently it has become larger and a little bit more bothersome. He is able to do most of his activities without any significant discomfort. He also has a problem with constipation which leads to problems with hemorrhoids.  No chronic cough. No difficulty with urination. No family history of hernia that he is aware of.  Past Medical History  Diagnosis Date  . HTN (hypertension)   . Atrial Fibrillation     Stress ECHO-Negative (09/22/98)  . Hyperlipidemia   . Diverticulosis   . 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 Anemia   . Hemorrhoids summer 2005    s/p ligation of internal hemorrhoids  . Adenomatous colon polyp     Past Surgical History  Procedure Laterality Date  . Throat surgery  childhood  . Hemorrhoid surgery  2005  . Cardiolite study  2004    negative  . Vasectomy    . Colonoscopy      Family History  Problem Relation Age of Onset  . Coronary artery disease Father 6    s/p MI in earlly 70s  . Heart disease Father   . Parkinson's disease Father   . Hypertension Brother   . Heart disease Mother   . Hypertension Mother   . Arthritis Mother   . Cancer Maternal Grandmother   . Cancer Maternal Grandfather   . Heart attack Paternal Grandfather     Social History History  Substance Use Topics  . Smoking status: Former Smoker -- 1.00 packs/day    Types: Cigarettes  . Smokeless tobacco: Former Neurosurgeon    Quit date: 02/19/1998  . Alcohol Use: 1.2 oz/week    2 Glasses of wine per week     Comment: weekly    No Known Allergies  Current Outpatient Prescriptions  Medication Sig Dispense Refill  . aspirin 81 MG tablet Take 81 mg by mouth  daily.      . fish oil-omega-3 fatty acids 1000 MG capsule Take 1 g by mouth daily.      Marland Kitchen lisinopril (PRINIVIL,ZESTRIL) 10 MG tablet Take 1 tablet (10 mg total) by mouth daily. Needs office visit  30 tablet  11  . Multiple Vitamins-Minerals (MULTIVITAMIN PO) Take 1 Centrum Silver multivitamin by mouth daily      . simvastatin (ZOCOR) 20 MG tablet Take 1 tablet (20 mg total) by mouth at bedtime. Needs office visit  30 tablet  11  . vitamin E 400 UNIT capsule Take 400 Units by mouth daily.       No current facility-administered medications for this visit.    Review of Systems Review of Systems  Constitutional: Negative.   HENT: Negative.   Respiratory: Negative.   Cardiovascular: Negative.   Gastrointestinal: Positive for constipation.  Genitourinary: Negative.   Hematological: Negative.     Blood pressure 138/86, pulse 68, temperature 97.2 F (36.2 C), temperature source Temporal, resp. rate 16, height 5\' 10"  (1.778 m), weight 191 lb 3.2 oz (86.728 kg), SpO2 98.00%.  Physical Exam Physical Exam  Constitutional: He appears well-developed and well-nourished. No distress.  HENT:  Head: Normocephalic and atraumatic.  Eyes: No scleral icterus.  Neck: Neck supple.  Cardiovascular: Normal rate, regular rhythm and normal heart sounds.   Pulmonary/Chest: Effort normal and breath sounds normal.  Abdominal: Soft. He exhibits no distension and no mass. There is no tenderness.  Genitourinary:  Reducible right inguinal bulge. No left inguinal bulge. No testicular masses.  Musculoskeletal: Normal range of motion.  Lymphadenopathy:    He has no cervical adenopathy.  Neurological: He is alert.  Skin: Skin is warm and dry.  Psychiatric: He has a normal mood and affect. His behavior is normal.    Data Reviewed I reviewed the notes from Dr. Cleta Alberts and Dr. Leone Payor  Assessment    Enlarging reducible right inguinal hernia. Has a history of atrial fibrillation in the distant past but this has  not been a problem for many years. He is very active without difficulty. He does have some constipation and we talked about ways to improve this. I think this is the stimulus for his hemorrhoidal disease so at this time I would not do anything about that until the constipation has improved.     Plan    Right inguinal hernia. Mesh. We discussed open and laparoscopic techniques and he chose the open technique. We'll also place an On-Q pain pump.  I have explained the procedure, risks, and aftercare of inguinal hernia repair.  Risks include but are not limited to bleeding, infection, wound problems, anesthesia, recurrence, bladder or intestine injury, urinary retention, testicular dysfunction, chronic pain, mesh problems.  He seems to understand and would like to proceed.  He will call back when he wants to schedule the surgery.       Shelbey Spindler J 02/19/2013, 9:34 AM

## 2013-03-01 ENCOUNTER — Encounter: Payer: Self-pay | Admitting: Cardiology

## 2013-03-03 ENCOUNTER — Ambulatory Visit: Payer: 59

## 2013-03-03 ENCOUNTER — Ambulatory Visit: Payer: 59 | Admitting: Emergency Medicine

## 2013-03-03 VITALS — BP 122/72 | HR 67 | Temp 98.1°F | Resp 16 | Ht 68.25 in | Wt 190.8 lb

## 2013-03-03 DIAGNOSIS — S8012XA Contusion of left lower leg, initial encounter: Secondary | ICD-10-CM

## 2013-03-03 DIAGNOSIS — S8010XA Contusion of unspecified lower leg, initial encounter: Secondary | ICD-10-CM

## 2013-03-03 NOTE — Progress Notes (Signed)
  Subjective:    Patient ID: Alec Wilson, male    DOB: 08/04/1951, 62 y.o.   MRN: [id number]  HPI patient states he was applying one week ago and struck his left upper she and on a trailer hitch. He now has severe pain and swelling over the proximal tibia. He was concerned when he developed discoloration down to the ankle with swelling of the ankle    Review of Systems     Objective:   Physical Exam There is an abrasion present over the proximal tibia. There is discoloration of the skin beneath this. There is mild puffiness of the ankle.  [facility] reading (PRIMARY) by  Dr.Daub is a linear area seen over the tibial tuberosity which is probably normal but couldn represent a tiny vertical D.      Assessment & Plan:  Patient has significant hematoma over his tibia. He is to treat this with ice keep it clean with soap and water and return to clinic if any worsening

## 2013-08-16 ENCOUNTER — Ambulatory Visit: Payer: 59 | Admitting: Family Medicine

## 2013-08-16 VITALS — BP 122/76 | HR 73 | Temp 98.6°F | Resp 16 | Ht 68.25 in | Wt 190.2 lb

## 2013-08-16 DIAGNOSIS — R05 Cough: Secondary | ICD-10-CM

## 2013-08-16 DIAGNOSIS — R059 Cough, unspecified: Secondary | ICD-10-CM

## 2013-08-16 DIAGNOSIS — R058 Other specified cough: Secondary | ICD-10-CM

## 2013-08-16 MED ORDER — ALBUTEROL SULFATE HFA 108 (90 BASE) MCG/ACT IN AERS: 2.0000 | INHALATION_SPRAY | Freq: Four times a day (QID) | RESPIRATORY_TRACT | Status: DC | PRN

## 2013-08-16 MED ORDER — HYDROCODONE-HOMATROPINE 5-1.5 MG/5ML PO SYRP: 5.0000 mL | ORAL_SOLUTION | ORAL | Status: DC | PRN

## 2013-08-16 MED ORDER — AZITHROMYCIN 250 MG PO TABS: ORAL_TABLET | ORAL | Status: DC

## 2013-08-16 NOTE — Patient Instructions (Addendum)
Drink plenty of fluids to keep secretions thin  Take medications as directed  Return if worse

## 2013-08-16 NOTE — Progress Notes (Signed)
Subjective: Patient had a upper for infection about 3 weeks ago. He has improved with the exception of the cough. The cough has persisted. It seemed to improve and now is getting worse again. He coughs a lot lately day and at night. There is a little bit of wheezing. Not bringing up as much phlegm as he was. He just can't get rid of it.  Objective: Healthy-appearing man in no acute distress. His TMs are normal. Throat clear. Neck supple without significant nodes. Chest is clear to auscultation. He does seem to have a little decreased expiration of forced expiration. No definite wheezes right now.  Assessment Posttrial bronchitis  Plan: Zithromax Albuterol Hycodan  If not improving I would consider some course of prednisone in him.

## 2013-11-25 ENCOUNTER — Ambulatory Visit (INDEPENDENT_AMBULATORY_CARE_PROVIDER_SITE_OTHER): Payer: 59 | Admitting: Family Medicine

## 2013-11-25 VITALS — BP 112/80 | HR 67 | Temp 97.9°F | Resp 16 | Ht 69.0 in | Wt 188.0 lb

## 2013-11-25 DIAGNOSIS — R05 Cough: Secondary | ICD-10-CM

## 2013-11-25 DIAGNOSIS — R058 Other specified cough: Secondary | ICD-10-CM

## 2013-11-25 DIAGNOSIS — R059 Cough, unspecified: Secondary | ICD-10-CM

## 2013-11-25 MED ORDER — BENZONATATE 100 MG PO CAPS: 100.0000 mg | ORAL_CAPSULE | Freq: Three times a day (TID) | ORAL | Status: DC

## 2013-11-25 MED ORDER — AZITHROMYCIN 250 MG PO TABS: ORAL_TABLET | ORAL | Status: DC

## 2013-11-25 MED ORDER — HYDROCODONE-HOMATROPINE 5-1.5 MG/5ML PO SYRP: ORAL_SOLUTION | ORAL | Status: DC

## 2013-11-25 NOTE — Progress Notes (Signed)
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 He had the flu a little less than a week ago. He now just has a persistent a cough. He keeps him awake some at night. His wife had a bad case of the flu. He feels like his mind been better since he had the flu shot. Otherwise she's not feeling too bad right now.  Objective: TMs are normal. Throat clear. Neck supple without nodes. Chest is clear to auscultation. Heart regular.  Assessment: Bronchitis, status post influenza, post viral cough syndrome  Plan: Zithromax, which I told him may or may not help this. Hycodan Tessalon  Plan: Return if not improved in

## 2013-11-25 NOTE — Patient Instructions (Signed)
Drink plenty of fluids and get enough rest  Take Tessalon 100-200 mg 3 times daily as needed for cough  Take the Hycodan cough syrup 1 teaspoon every 4-6 hours when needed for cough. It will cause drowsiness, you may not wish to use it when you're trying to work.  Take the azithromycin 2 tablets initially, then one daily for 4 days  Return if worse or not improving

## 2013-12-16 ENCOUNTER — Encounter: Payer: Self-pay | Admitting: Internal Medicine

## 2014-01-13 ENCOUNTER — Ambulatory Visit: Payer: 59 | Admitting: Family Medicine

## 2014-01-13 VITALS — BP 100/68 | HR 70 | Temp 98.5°F | Resp 16 | Ht 68.5 in | Wt 190.0 lb

## 2014-01-13 DIAGNOSIS — I10 Essential (primary) hypertension: Secondary | ICD-10-CM

## 2014-01-13 DIAGNOSIS — E785 Hyperlipidemia, unspecified: Secondary | ICD-10-CM

## 2014-01-13 DIAGNOSIS — R059 Cough, unspecified: Secondary | ICD-10-CM

## 2014-01-13 DIAGNOSIS — R05 Cough: Secondary | ICD-10-CM

## 2014-01-13 DIAGNOSIS — H9209 Otalgia, unspecified ear: Secondary | ICD-10-CM

## 2014-01-13 DIAGNOSIS — H9203 Otalgia, bilateral: Secondary | ICD-10-CM

## 2014-01-13 MED ORDER — HYDROCODONE-HOMATROPINE 5-1.5 MG/5ML PO SYRP: 5.0000 mL | ORAL_SOLUTION | Freq: Four times a day (QID) | ORAL | Status: DC | PRN

## 2014-01-13 MED ORDER — LISINOPRIL 10 MG PO TABS: 10.0000 mg | ORAL_TABLET | Freq: Every day | ORAL | Status: DC

## 2014-01-13 MED ORDER — SIMVASTATIN 20 MG PO TABS: 20.0000 mg | ORAL_TABLET | Freq: Every day | ORAL | Status: DC

## 2014-01-13 MED ORDER — AMOXICILLIN 875 MG PO TABS: 875.0000 mg | ORAL_TABLET | Freq: Two times a day (BID) | ORAL | Status: DC

## 2014-01-13 NOTE — Progress Notes (Signed)
Chief Complaint:  Chief Complaint  Patient presents with  . Medication Refill    linopril, simvastatin   . Cough    on and off     HPI: Alec Wilson is a 63 y.o. male who is here for : 1. HTN Medication refills , normally runs in the 120s /70s and occ mid 80-s. No SEs. NO CP or dizziness. Compliant.  2. Cough x 2 days, dry cough , he has had it for the last month x 2 when his wife flu and then he got similar sxs and then bronchitis. The cough starts out the same, dry and then gets worse. This time he also has bilateral ear pain. He was on a plane coming home from visiting [place] and [place]. + ear pressure while descending, but no facial pain or rhinorrhea. No fever or chills. Has not tried anything for this.    Past Medical History  Diagnosis Date  . HTN (hypertension)   . Atrial Fibrillation     Stress ECHO-Negative (09/22/98)  . Hyperlipidemia   . Diverticulosis   . 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 Anemia   . Hemorrhoids summer 2005    s/p ligation of internal hemorrhoids  . Adenomatous colon polyp    Past Surgical History  Procedure Laterality Date  . Throat surgery  childhood  . Hemorrhoid surgery  2005  . Cardiolite study  2004    negative  . Vasectomy    . Colonoscopy     History   Social History  . Marital Status: Married    Spouse Name: N/A    Number of Children: N/A  . Years of Education: N/A   Social History Main Topics  . Smoking status: Former Smoker -- 1.00 packs/day    Types: Cigarettes  . Smokeless tobacco: Former Systems developer    Quit date: 02/19/1998  . Alcohol Use: 1.2 oz/week    2 Glasses of wine per week     Comment: weekly  . Drug Use: No  . Sexual Activity: Yes    Birth Control/ Protection: Surgical   Other Topics Concern  . None   Social History Narrative  . None   Family History  Problem Relation Age of Onset  . Coronary artery disease Father 34    s/p MI in earlly 70s  . Heart disease Father   . Parkinson's  disease Father   . Hypertension Brother   . Heart disease Mother   . Hypertension Mother   . Arthritis Mother   . Cancer Maternal Grandmother   . Cancer Maternal Grandfather   . Heart attack Paternal Grandfather    No Known Allergies Prior to Admission medications   Medication Sig Start Date End Date Taking? Authorizing Provider  fish oil-omega-3 fatty acids 1000 MG capsule Take 1 g by mouth daily.   Yes Historical Provider, MD  lisinopril (PRINIVIL,ZESTRIL) 10 MG tablet Take 1 tablet (10 mg total) by mouth daily. Needs office visit 01/22/13  Yes Darlyne Russian, MD  Multiple Vitamins-Minerals (MULTIVITAMIN PO) Take 1 Centrum Silver multivitamin by mouth daily   Yes Historical Provider, MD  simvastatin (ZOCOR) 20 MG tablet Take 1 tablet (20 mg total) by mouth at bedtime. Needs office visit 01/22/13  Yes Darlyne Russian, MD  vitamin E 400 UNIT capsule Take 400 Units by mouth daily.   Yes Historical Provider, MD  albuterol (PROVENTIL HFA;VENTOLIN HFA) 108 (90 BASE) MCG/ACT inhaler Inhale 2 puffs into the  lungs every 6 (six) hours as needed for wheezing. 08/16/13   Posey Boyer, MD  aspirin 81 MG tablet Take 81 mg by mouth daily.    Historical Provider, MD  azithromycin (ZITHROMAX) 250 MG tablet Take 2 pills initially, then one daily for 4 days for infection 08/16/13   Posey Boyer, MD  azithromycin (ZITHROMAX) 250 MG tablet Take 2 pills today then one a day for 4 additional days 11/25/13   Posey Boyer, MD  benzonatate (TESSALON) 100 MG capsule Take 1 capsule (100 mg total) by mouth 3 (three) times daily. 11/25/13   Posey Boyer, MD  HYDROcodone-homatropine [facility]) 5-1.5 MG/5ML syrup Take 5 mLs by mouth every 4 (four) hours as needed for cough. 08/16/13   Posey Boyer, MD  HYDROcodone-homatropine [facility]) 5-1.5 MG/5ML syrup Take 1 teaspoon every 4-6 hours as needed for cough 11/25/13   Posey Boyer, MD     ROS: The patient denies fevers, chills, night sweats, unintentional weight loss, chest  pain, palpitations, wheezing, dyspnea on exertion, nausea, vomiting, abdominal pain, dysuria, hematuria, melena, numbness, weakness, or tingling.   All other systems have been reviewed and were otherwise negative with the exception of those mentioned in the HPI and as above.    PHYSICAL EXAM: Filed Vitals:   01/13/14 1426  BP: 100/68  Pulse: 70  Temp: 98.5 F (36.9 C)  Resp: 16   Filed Vitals:   01/13/14 1426  Height: 5' 8.5" (1.74 m)  Weight: 190 lb (86.183 kg)   Body mass index is 28.47 kg/(m^2).  General: Alert, no acute distress HEENT:  Normocephalic, atraumatic, oropharynx patent. EOMI, PERRLA, no sinus tenderness, TM are injected and slightly bloody but intact Erythematous throat, no exudates. Boggy nares.  Cardiovascular:  Regular rate and rhythm, no rubs murmurs or gallops.  No Carotid bruits, radial pulse intact. No pedal edema.  Respiratory: Clear to auscultation bilaterally.  No wheezes, rales, or rhonchi.  No cyanosis, no use of accessory musculature GI: No organomegaly, abdomen is soft and non-tender, positive bowel sounds.  No masses. Skin: No rashes. Neurologic: Facial musculature symmetric. Psychiatric: Patient is appropriate throughout our interaction. Lymphatic: No cervical lymphadenopathy Musculoskeletal: Gait intact.   LABS: Results for orders placed in visit on 01/22/13  CBC WITH DIFFERENTIAL      Result Value Ref Range   WBC 6.3  4.0 - 10.5 K/uL   RBC 4.89  4.22 - 5.81 MIL/uL   Hemoglobin 15.8  13.0 - 17.0 g/dL   HCT 44.4  39.0 - 52.0 %   MCV 90.8  78.0 - 100.0 fL   MCH 32.3  26.0 - 34.0 pg   MCHC 35.6  30.0 - 36.0 g/dL   RDW 14.2  11.5 - 15.5 %   Platelets 284  150 - 400 K/uL   Neutrophils Relative % 67  43 - 77 %   Neutro Abs 4.2  1.7 - 7.7 K/uL   Lymphocytes Relative 20  12 - 46 %   Lymphs Abs 1.3  0.7 - 4.0 K/uL   Monocytes Relative 6  3 - 12 %   Monocytes Absolute 0.4  0.1 - 1.0 K/uL   Eosinophils Relative 7 (*) 0 - 5 %   Eosinophils  Absolute 0.4  0.0 - 0.7 K/uL   Basophils Relative 0  0 - 1 %   Basophils Absolute 0.0  0.0 - 0.1 K/uL   Smear Review Criteria for review not met    COMPREHENSIVE METABOLIC PANEL  Result Value Ref Range   Sodium 137  135 - 145 mEq/L   Potassium 4.1  3.5 - 5.3 mEq/L   Chloride 101  96 - 112 mEq/L   CO2 26  19 - 32 mEq/L   Glucose, Bld 82  70 - 99 mg/dL   BUN 15  6 - 23 mg/dL   Creat 0.90  0.50 - 1.35 mg/dL   Total Bilirubin 0.7  0.3 - 1.2 mg/dL   Alkaline Phosphatase 93  39 - 117 U/L   AST 18  0 - 37 U/L   ALT 20  0 - 53 U/L   Total Protein 7.1  6.0 - 8.3 g/dL   Albumin 4.4  3.5 - 5.2 g/dL   Calcium 9.7  8.4 - 10.5 mg/dL  TSH      Result Value Ref Range   TSH 2.283  0.350 - 4.500 uIU/mL  LIPID PANEL      Result Value Ref Range   Cholesterol 184  0 - 200 mg/dL   Triglycerides 125  <150 mg/dL   HDL 71  >39 mg/dL   Total CHOL/HDL Ratio 2.6     VLDL 25  0 - 40 mg/dL   LDL Cholesterol 88  0 - 99 mg/dL  PSA      Result Value Ref Range   PSA 1.27  <=4.00 ng/mL  IFOBT (OCCULT BLOOD)      Result Value Ref Range   IFOBT Positive    POCT SKIN KOH      Result Value Ref Range   Skin KOH, POC Positive    POCT URINALYSIS DIPSTICK      Result Value Ref Range   Color, UA yellow     Clarity, UA clear     Glucose, UA neg     Bilirubin, UA neg     Ketones, UA 15     Spec Grav, UA 1.015     Blood, UA neg     pH, UA 5.5     Protein, UA neg     Urobilinogen, UA 0.2     Nitrite, UA neg     Leukocytes, UA Negative    POCT UA - MICROSCOPIC ONLY      Result Value Ref Range   WBC, Ur, HPF, POC neg     RBC, urine, microscopic neg     Bacteria, U Microscopic neg     Mucus, UA trace     Epithelial cells, urine per micros 0-1     Crystals, Ur, HPF, POC neg     Casts, Ur, LPF, POC neg     Yeast, UA neg       EKG/XRAY:   Primary read interpreted by Dr. Marin Comment at [facility].   ASSESSMENT/PLAN: Encounter Diagnoses  Name Primary?  . Otalgia of both ears Yes  . HTN (hypertension)   .  Cough   . Unspecified essential hypertension   . Other and unspecified hyperlipidemia    Will treat as if he has ear infection, TM are injected and slightly bloody but intact Erythematous throat Rx Amoxacillin 875 mg BID x 10 days Rx Hycodan Refilled his HTN and Hyperlipidemia meds. He has an appt with Dr Everlene Farrier in May/June for annual and can get labs then. Last labs 1 year ago were normal.  I just would hate to stick him while he feels so bad when he is going to return in 3 months.  F/u prn  Gross sideeffects, risk and benefits, and alternatives of  medications d/w patient. Patient is aware that all medications have potential sideeffects and we are unable to predict every sideeffect or drug-drug interaction that may occur.  LE, [place], DO 01/13/2014 3:14 PM

## 2014-04-22 ENCOUNTER — Encounter: Payer: Self-pay | Admitting: Internal Medicine

## 2014-04-29 ENCOUNTER — Ambulatory Visit (INDEPENDENT_AMBULATORY_CARE_PROVIDER_SITE_OTHER): Payer: 59

## 2014-04-29 ENCOUNTER — Telehealth: Payer: Self-pay | Admitting: *Deleted

## 2014-04-29 ENCOUNTER — Ambulatory Visit (INDEPENDENT_AMBULATORY_CARE_PROVIDER_SITE_OTHER): Payer: 59 | Admitting: Emergency Medicine

## 2014-04-29 ENCOUNTER — Encounter: Payer: Self-pay | Admitting: Emergency Medicine

## 2014-04-29 VITALS — BP 122/77 | HR 70 | Temp 98.1°F | Resp 16 | Ht 68.5 in | Wt 191.6 lb

## 2014-04-29 DIAGNOSIS — M25552 Pain in left hip: Secondary | ICD-10-CM

## 2014-04-29 DIAGNOSIS — M25559 Pain in unspecified hip: Secondary | ICD-10-CM

## 2014-04-29 DIAGNOSIS — Z0000 Encounter for general adult medical examination without abnormal findings: Secondary | ICD-10-CM

## 2014-04-29 DIAGNOSIS — M25551 Pain in right hip: Secondary | ICD-10-CM

## 2014-04-29 DIAGNOSIS — E785 Hyperlipidemia, unspecified: Secondary | ICD-10-CM

## 2014-04-29 LAB — CBC
HCT: 43.3 % (ref 39.0–52.0)
Hemoglobin: 14.9 g/dL (ref 13.0–17.0)
MCH: 32 pg (ref 26.0–34.0)
MCHC: 34.4 g/dL (ref 30.0–36.0)
MCV: 93.1 fL (ref 78.0–100.0)
PLATELETS: 287 10*3/uL (ref 150–400)
RBC: 4.65 MIL/uL (ref 4.22–5.81)
RDW: 14.1 % (ref 11.5–15.5)
WBC: 4.4 10*3/uL (ref 4.0–10.5)

## 2014-04-29 LAB — POCT URINALYSIS DIPSTICK
Bilirubin, UA: NEGATIVE
Glucose, UA: NEGATIVE
Ketones, UA: NEGATIVE
Leukocytes, UA: NEGATIVE
NITRITE UA: NEGATIVE
PH UA: 7
PROTEIN UA: NEGATIVE
RBC UA: NEGATIVE
SPEC GRAV UA: 1.015
UROBILINOGEN UA: 0.2

## 2014-04-29 LAB — IFOBT (OCCULT BLOOD): IMMUNOLOGICAL FECAL OCCULT BLOOD TEST: POSITIVE

## 2014-04-29 MED ORDER — LOSARTAN POTASSIUM 25 MG PO TABS: 25.0000 mg | ORAL_TABLET | Freq: Every day | ORAL | Status: DC

## 2014-04-29 MED ORDER — SIMVASTATIN 20 MG PO TABS: 20.0000 mg | ORAL_TABLET | Freq: Every day | ORAL | Status: DC

## 2014-04-29 NOTE — Progress Notes (Addendum)
@[facility]GO @  Patient ID: QUANTE PETTRY MRN: [id number], DOB: 1951/09/02 63 y.o. Date of Encounter: 04/29/2014, 1:20 PM  Primary Physician: Jenny Reichmann, MD  Chief Complaint: Physical (CPE)  HPI: 63 y.o. y/o male with history noted below here for CPE.  Doing well. No issues/complaints.  Review of Systems Consitutional: No fever, chills, fatigue, night sweats, lymphadenopathy, or weight changes. Eyes: No visual changes, eye redness, or discharge. ENT/Mouth: Ears: No otalgia, tinnitus, hearing loss, discharge. Nose: No congestion, rhinorrhea, sinus pain, or epistaxis. Throat: No sore throat, post nasal drip, or teeth pain. Cardiovascular: No CP, palpitations, diaphoresis, DOE, edema, orthopnea, PND. He has been stable from a cardiac standpoint and recently was released by his cardiologist to Respiratory:  hemoptysis, SOB, or wheezing. He had a persistent cough for the winter which has finally resolved Gastrointestinal: No anorexia, dysphagia, reflux, pain, nausea, vomiting, hematemesis, diarrhea, constipation, BRBPR, or melena. Genitourinary: No dysuria, frequency, urgency, hematuria, incontinence, nocturia, decreased urinary stream, discharge, impotence, or testicular pain/masses. Musculoskeletal: He has significant pain in both hips especially when he is up on his feet all day. He has limited range of motion of the hips. He is not exercising as much as previous. Previously he had done you'll do and ride a bicycle but only walks now. Skin: No rash, erythema, lesion changes, pain, warmth, jaundice, or pruritis. Neurological: No headache, dizziness, syncope, seizures, tremors, memory loss, coordination problems, or paresthesias. Psychological: No anxiety, depression, hallucinations, SI/HI. Endocrine: No fatigue, polydipsia, polyphagia, polyuria, or known diabetes. All other systems were reviewed and are otherwise negative.  Past Medical History  Diagnosis Date  . HTN (hypertension)   . Atrial  Fibrillation     Stress ECHO-Negative (09/22/98)  . Hyperlipidemia   . Diverticulosis   . 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 Anemia   . Hemorrhoids summer 2005    s/p ligation of internal hemorrhoids  . Adenomatous colon polyp      Past Surgical History  Procedure Laterality Date  . Throat surgery  childhood  . Hemorrhoid surgery  2005  . Cardiolite study  2004    negative  . Vasectomy    . Colonoscopy      Home Meds:  Prior to Admission medications   Medication Sig Start Date End Date Taking? Authorizing Provider  amoxicillin (AMOXIL) 500 MG capsule Take 500 mg by mouth 3 (three) times daily. Pt states he is taking antibiotic in preparation for tooth implant   Yes Historical Provider, MD  aspirin 81 MG tablet Take 81 mg by mouth daily.   Yes Historical Provider, MD  fish oil-omega-3 fatty acids 1000 MG capsule Take 1 g by mouth daily.   Yes Historical Provider, MD  lisinopril (PRINIVIL,ZESTRIL) 10 MG tablet Take 1 tablet (10 mg total) by mouth daily. Needs office visit 01/13/14  Yes Thao P Le, DO  Multiple Vitamins-Minerals (MULTIVITAMIN PO) Take 1 Centrum Silver multivitamin by mouth daily   Yes Historical Provider, MD  simvastatin (ZOCOR) 20 MG tablet Take 1 tablet (20 mg total) by mouth at bedtime. Needs office visit 01/13/14  Yes Thao P Le, DO  vitamin E 400 UNIT capsule Take 400 Units by mouth daily.   Yes Historical Provider, MD  albuterol (PROVENTIL HFA;VENTOLIN HFA) 108 (90 BASE) MCG/ACT inhaler Inhale 2 puffs into the lungs every 6 (six) hours as needed for wheezing. 08/16/13   Posey Boyer, MD  amoxicillin (AMOXIL) 875 MG tablet Take 1 tablet (875 mg total) by mouth 2 (two)  times daily. 01/13/14   Thao P Le, DO  HYDROcodone-homatropine (HYCODAN) 5-1.5 MG/5ML syrup Take 5 mLs by mouth every 6 (six) hours as needed for cough. 01/13/14   Thao P Le, DO    Allergies: No Known Allergies  History   Social History  . Marital Status: Married    Spouse Name: N/A     Number of Children: N/A  . Years of Education: N/A   Occupational History  . Not on file.   Social History Main Topics  . Smoking status: Former Smoker -- 1.00 packs/day    Types: Cigarettes  . Smokeless tobacco: Former Systems developer    Quit date: 02/19/1998  . Alcohol Use: 1.2 oz/week    2 Glasses of wine per week     Comment: weekly  . Drug Use: No  . Sexual Activity: Yes    Birth Control/ Protection: Surgical   Other Topics Concern  . Not on file   Social History Narrative  . No narrative on file    Family History  Problem Relation Age of Onset  . Coronary artery disease Father 56    s/p MI in earlly 70s  . Heart disease Father   . Parkinson's disease Father   . Hypertension Brother   . Heart disease Mother   . Hypertension Mother   . Arthritis Mother   . Cancer Maternal Grandmother   . Cancer Maternal Grandfather   . Heart attack Paternal Grandfather     Physical Exam: Blood pressure 122/77, pulse 70, temperature 98.1 F (36.7 C), temperature source Oral, resp. rate 16, height 5' 8.5" (1.74 m), weight 191 lb 9.6 oz (86.909 kg), SpO2 98.00%.  General: Well developed, well nourished, in no acute distress. HEENT: Normocephalic, atraumatic. Conjunctiva pink, sclera non-icteric. Pupils 2 mm constricting to 1 mm, round, regular, and equally reactive to light and accomodation. EOMI. Internal auditory canal clear. TMs with good cone of light and without pathology. Nasal mucosa pink. Nares are without discharge. No sinus tenderness. Oral mucosa pink. Dentition. Pharynx without exudate.   Neck: Supple. Trachea midline. No thyromegaly. Full ROM. No lymphadenopathy. Lungs: Clear to auscultation bilaterally without wheezes, rales, or rhonchi. Breathing is of normal effort and unlabored. Cardiovascular: RRR with S1 S2. No murmurs, rubs, or gallops appreciated. Distal pulses 2+ symmetrically. No carotid or abdominal bruits. Abdomen: Soft, non-tender, non-distended with normoactive  bowel sounds. No hepatosplenomegaly or masses. No rebound/guarding. No CVA tenderness. Without hernia on the left. There is a large right inguinal hernia.  Rectal: No external hemorrhoids or fissures. Rectal vault without masses Genitourinary: circumcised male. No penile lesions. Testes descended bilaterally, and smooth without tenderness or masses.  Musculoskeletal: Full range of motion and 5/5 strength throughout. Without swelling, atrophy, tenderness, crepitus, or warmth. Extremities without clubbing, cyanosis, or edema. Calves supple. Examination of both hips reveal very limited internal rotation. External rotation is approximately 50% of what it should be Skin: Warm and moist without erythema, ecchymosis, wounds, or rash. Neuro: A+Ox3. CN II-XII grossly intact. Moves all extremities spontaneously. Full sensation throughout. Normal gait. DTR 2+ throughout upper and lower extremities. Finger to nose intact. Psych:  Responds to questions appropriately with a normal affect.  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 y/o here for his physical exam. He has developed significant problems with both hips. He also needs to go ahead and have his right inguinal hernia repaired. He wants to hold off seeing the orthopedist at the present time the. I changed his blood pressure medicine to Cozaar 25 to see if it would help with his cough. Patient does have blood in his stool most like is secondary to  hemorrhoids. He has had hemorrhoid surgery in the past. I did advise him to go ahead and schedule his colonoscopy. -  Signed, Nena Jordan, MD 04/29/2014 1:20 PM

## 2014-04-29 NOTE — Telephone Encounter (Signed)
Lm for pt to rtn call: Positive Hemosure for blood Pt needs to make sure he schedules a colonoscopy

## 2014-04-30 ENCOUNTER — Other Ambulatory Visit: Payer: Self-pay | Admitting: Radiology

## 2014-04-30 ENCOUNTER — Encounter: Payer: Self-pay | Admitting: Emergency Medicine

## 2014-04-30 DIAGNOSIS — M25559 Pain in unspecified hip: Secondary | ICD-10-CM

## 2014-04-30 LAB — COMPREHENSIVE METABOLIC PANEL
ALT: 19 U/L (ref 0–53)
AST: 17 U/L (ref 0–37)
Albumin: 4.2 g/dL (ref 3.5–5.2)
Alkaline Phosphatase: 89 U/L (ref 39–117)
BUN: 13 mg/dL (ref 6–23)
CHLORIDE: 102 meq/L (ref 96–112)
CO2: 28 meq/L (ref 19–32)
CREATININE: 0.87 mg/dL (ref 0.50–1.35)
Calcium: 9.3 mg/dL (ref 8.4–10.5)
Glucose, Bld: 87 mg/dL (ref 70–99)
Potassium: 5.5 mEq/L — ABNORMAL HIGH (ref 3.5–5.3)
SODIUM: 137 meq/L (ref 135–145)
TOTAL PROTEIN: 6.5 g/dL (ref 6.0–8.3)
Total Bilirubin: 0.8 mg/dL (ref 0.2–1.2)

## 2014-04-30 LAB — LIPID PANEL
CHOL/HDL RATIO: 2.3 ratio
CHOLESTEROL: 164 mg/dL (ref 0–200)
HDL: 70 mg/dL (ref >39)
LDL Cholesterol: 72 mg/dL (ref 0–99)
Triglycerides: 112 mg/dL (ref <150)
VLDL: 22 mg/dL (ref 0–40)

## 2014-04-30 LAB — PSA: PSA: 1.31 ng/mL (ref <=4.00)

## 2014-04-30 LAB — VITAMIN D 25 HYDROXY (VIT D DEFICIENCY, FRACTURES): VIT D 25 HYDROXY: 59 ng/mL (ref 30–89)

## 2014-04-30 NOTE — Telephone Encounter (Signed)
Looks like pt was notified in office.

## 2014-06-05 ENCOUNTER — Ambulatory Visit (INDEPENDENT_AMBULATORY_CARE_PROVIDER_SITE_OTHER): Payer: 59 | Admitting: Emergency Medicine

## 2014-06-05 VITALS — BP 132/68 | HR 80 | Temp 97.5°F | Resp 16 | Ht 69.25 in | Wt 195.4 lb

## 2014-06-05 DIAGNOSIS — I10 Essential (primary) hypertension: Secondary | ICD-10-CM

## 2014-06-05 NOTE — Progress Notes (Signed)
[facility] [address] [place] [address] [phone]  Date:  06/05/2014   Name:  Alec Wilson   DOB:  07/19/1951   MRN:  [id number]  PCP:  Alec Reichmann, MD    Chief Complaint: Medication Management   History of Present Illness:  [name] is a 63 y.o. very pleasant male patient who presents with the following:  Recently had BP medication change from lisinopril to cozaar.  Is concerned as his BP is running in the 130-140 range.  Not symptomatic and tolerating medication well.  Denies other complaint or health concern today.   Patient Active Problem List   Diagnosis Date Noted  . Internal hemorrhoids with bleeding and prolapse 02/15/2013  . Hernia, inguinal-right 01/22/2013  . HTN (hypertension)   . Diverticulitis   . Atrial Fibrillation   . Hyperlipidemia     Past Medical History  Diagnosis Date  . HTN (hypertension)   . Atrial Fibrillation     Stress ECHO-Negative (09/22/98)  . Hyperlipidemia   . Diverticulosis   . RMSF [place] spotted fever) July 2005  . Anemia   . Hemorrhoids summer 2005    s/p ligation of internal hemorrhoids  . Adenomatous colon polyp     Past Surgical History  Procedure Laterality Date  . Throat surgery  childhood  . Hemorrhoid surgery  2005  . Cardiolite study  2004    negative  . Vasectomy    . Colonoscopy      History  Substance Use Topics  . Smoking status: Former Smoker -- 1.00 packs/day    Types: Cigarettes  . Smokeless tobacco: Former Systems developer    Quit date: 02/19/1998  . Alcohol Use: 1.2 oz/week    2 Glasses of wine per week     Comment: weekly    Family History  Problem Relation Age of Onset  . Coronary artery disease Father 26    s/p MI in earlly 70s  . Heart disease Father   . Parkinson's disease Father   . Hypertension Brother   . Heart disease Mother   . Hypertension Mother   . Arthritis Mother   . Cancer Maternal Grandmother   . Cancer Maternal Grandfather   . Heart  attack Paternal Grandfather     No Known Allergies  Medication list has been reviewed and updated.  Current Outpatient Prescriptions on File Prior to Visit  Medication Sig Dispense Refill  . aspirin 81 MG tablet Take 81 mg by mouth daily.      . fish oil-omega-3 fatty acids 1000 MG capsule Take 1 g by mouth daily.      Marland Kitchen HYDROcodone-homatropine (HYCODAN) 5-1.5 MG/5ML syrup Take 5 mLs by mouth every 6 (six) hours as needed for cough.  120 mL  0  . losartan (COZAAR) 25 MG tablet Take 1 tablet (25 mg total) by mouth daily.  90 tablet  3  . Multiple Vitamins-Minerals (MULTIVITAMIN PO) Take 1 Centrum Silver multivitamin by mouth daily      . simvastatin (ZOCOR) 20 MG tablet Take 1 tablet (20 mg total) by mouth at bedtime. Needs office visit  30 tablet  11  . vitamin E 400 UNIT capsule Take 400 Units by mouth daily.       No current facility-administered medications on file prior to visit.    Review of Systems:  As per HPI, otherwise negative.    Physical Examination: Filed Vitals:   06/05/14 1311  BP: 132/68  Pulse: 80  Temp: 97.5 F (36.4 C)  Resp: 16   Filed Vitals:   06/05/14 1311  Height: 5' 9.25" (1.759 m)  Weight: 195 lb 6.4 oz (88.633 kg)   Body mass index is 28.65 kg/(m^2). Ideal Body Weight: Weight in (lb) to have BMI = 25: 170.2   GEN: WDWN, NAD, Non-toxic, Alert & Oriented x 3 HEENT: Atraumatic, Normocephalic.  Ears and Nose: No external deformity. Chest clear BBS= COR RRR no murmur EXTR: No clubbing/cyanosis/edema NEURO: Normal gait.  PSYCH: Normally interactive. Conversant. Not depressed or anxious appearing.  Calm demeanor.    Assessment and Plan: Hypertension  Signed,  Ellison Carwin, MD

## 2014-11-09 ENCOUNTER — Encounter (HOSPITAL_COMMUNITY): Payer: Self-pay | Admitting: Emergency Medicine

## 2014-11-09 ENCOUNTER — Emergency Department (HOSPITAL_COMMUNITY)
Admission: EM | Admit: 2014-11-09 | Discharge: 2014-11-09 | Disposition: A | Discharge status: Home / Self Care | Payer: 59 | Source: Home / Self Care | Attending: Family Medicine | Admitting: Family Medicine

## 2014-11-09 DIAGNOSIS — M26629 Arthralgia of temporomandibular joint, unspecified side: Secondary | ICD-10-CM

## 2014-11-09 DIAGNOSIS — M2669 Other specified disorders of temporomandibular joint: Secondary | ICD-10-CM

## 2014-11-09 NOTE — ED Notes (Signed)
C/o right ear pain.  On set last night.  Denies fever.  No drainage from ear.  No otc meds tried.

## 2014-11-09 NOTE — ED Provider Notes (Signed)
CSN: [id number]     Arrival date & time 11/09/14  1235 History   First MD Initiated Contact with Patient 11/09/14 1255     Chief Complaint  Patient presents with  . Otalgia   (Consider location/radiation/quality/duration/timing/severity/associated sxs/prior Treatment) Patient is a 63 y.o. male presenting with ear pain. The history is provided by the patient.  Otalgia Location:  Right Behind ear:  No abnormality Quality:  Sharp Severity:  Mild Onset quality:  Sudden Duration:  10 hours Timing:  Sporadic Progression:  Unchanged Chronicity:  New Context comment:  S/p dental procedure last week., no uri sx, no travel. Relieved by:  None tried Worsened by:  Nothing tried Ineffective treatments:  None tried Associated symptoms: no cough, no ear discharge, no fever, no rhinorrhea and no sore throat     Past Medical History  Diagnosis Date  . HTN (hypertension)   . Atrial Fibrillation     Stress ECHO-Negative (09/22/98)  . Hyperlipidemia   . Diverticulosis   . 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 Anemia   . Hemorrhoids summer 2005    s/p ligation of internal hemorrhoids  . Adenomatous colon polyp    Past Surgical History  Procedure Laterality Date  . Throat surgery  childhood  . Hemorrhoid surgery  2005  . Cardiolite study  2004    negative  . Vasectomy    . Colonoscopy     Family History  Problem Relation Age of Onset  . Coronary artery disease Father 33    s/p MI in earlly 70s  . Heart disease Father   . Parkinson's disease Father   . Hypertension Brother   . Heart disease Mother   . Hypertension Mother   . Arthritis Mother   . Cancer Maternal Grandmother   . Cancer Maternal Grandfather   . Heart attack Paternal Grandfather    History  Substance Use Topics  . Smoking status: Former Smoker -- 1.00 packs/day    Types: Cigarettes  . Smokeless tobacco: Former Systems developer    Quit date: 02/19/1998  . Alcohol Use: 1.2 oz/week    2 Glasses of wine per week      Comment: weekly    Review of Systems  Constitutional: Negative.  Negative for fever.  HENT: Positive for dental problem and ear pain. Negative for ear discharge, rhinorrhea and sore throat.   Respiratory: Negative for cough.     Allergies  Review of patient's allergies indicates no known allergies.  Home Medications   Prior to Admission medications   Medication Sig Start Date End Date Taking? Authorizing Provider  aspirin 81 MG tablet Take 81 mg by mouth daily.   Yes Historical Provider, MD  fish oil-omega-3 fatty acids 1000 MG capsule Take 1 g by mouth daily.   Yes Historical Provider, MD  HYDROcodone-homatropine (HYCODAN) 5-1.5 MG/5ML syrup Take 5 mLs by mouth every 6 (six) hours as needed for cough. 01/13/14  Yes Thao P Le, DO  losartan (COZAAR) 25 MG tablet Take 1 tablet (25 mg total) by mouth daily. 04/29/14  Yes Darlyne Russian, MD  Multiple Vitamins-Minerals (MULTIVITAMIN PO) Take 1 Centrum Silver multivitamin by mouth daily   Yes Historical Provider, MD  simvastatin (ZOCOR) 20 MG tablet Take 1 tablet (20 mg total) by mouth at bedtime. Needs office visit 04/29/14  Yes Darlyne Russian, MD  vitamin E 400 UNIT capsule Take 400 Units by mouth daily.   Yes Historical Provider, MD   BP 144/88 mmHg  Pulse  75  Temp(Src) 98 F (36.7 C) (Oral)  Resp 16  SpO2 97% Physical Exam  Constitutional: He is oriented to person, place, and time. He appears well-developed and well-nourished. No distress.  HENT:  Head: Normocephalic.  Right Ear: External ear normal.  Left Ear: External ear normal.  Mouth/Throat: Oropharynx is clear and moist.  Right tmj soreness.  Eyes: Conjunctivae are normal.  Neck: Normal range of motion. Neck supple.  Lymphadenopathy:    He has no cervical adenopathy.  Neurological: He is alert and oriented to person, place, and time.  Skin: Skin is warm and dry.  Nursing note and vitals reviewed.   ED Course  Procedures (including critical care time) Labs  Review Labs Reviewed - No data to display  Imaging Review No results found.   MDM   1. TMJ pain dysfunction syndrome        Billy Fischer, MD 11/09/14 1326

## 2014-11-09 NOTE — Discharge Instructions (Signed)
Ibuprofen and soft diet as needed, see your dentist if further problems.

## 2015-02-09 ENCOUNTER — Ambulatory Visit (INDEPENDENT_AMBULATORY_CARE_PROVIDER_SITE_OTHER): Payer: 59 | Admitting: Family Medicine

## 2015-02-09 ENCOUNTER — Encounter: Payer: Self-pay | Admitting: Physician Assistant

## 2015-02-09 VITALS — BP 142/81 | HR 65 | Temp 97.7°F | Resp 16 | Ht 68.25 in | Wt 194.6 lb

## 2015-02-09 DIAGNOSIS — H73011 Bullous myringitis, right ear: Secondary | ICD-10-CM

## 2015-02-09 DIAGNOSIS — H9201 Otalgia, right ear: Secondary | ICD-10-CM

## 2015-02-09 MED ORDER — HYDROCODONE-ACETAMINOPHEN 5-325 MG PO TABS: 1.0000 | ORAL_TABLET | Freq: Four times a day (QID) | ORAL | Status: DC | PRN

## 2015-02-09 MED ORDER — AZITHROMYCIN 250 MG PO TABS: ORAL_TABLET | ORAL | Status: DC

## 2015-02-09 NOTE — Progress Notes (Signed)
[facility] [address], [place] [address] [phone]  Date:  02/09/2015   Name:  Alec Wilson   DOB:  01-Sep-1951   MRN:  [id number]  PCP:  Jenny Reichmann, MD    Chief Complaint: Ear Pain   History of Present Illness:  Alec Wilson is a 64 y.o. very pleasant male patient who presents with the following:  Patient reports 2 days of the sounds of "squealing", ear pain, and pressure that has progressively worsened.  Patient reports that it sounds like cotton is stuck in the ear.  The pain is constant and slightly improved.  Yesterday it felt like a pencil was stuck in his ear according to patient.  He reports little ear drainage that occurred yesterday.  He notes some hearing loss.  He denies fever, sob, dyspnea, cough, or nasal congestion.  He also reports no dizziness.  Note: patient has documented dx of TMJ syndrome 3 months ago.  Patient notes that he had come in for right ear and jaw pain that occurred 2 days after a tooth placement at his right side.      Patient Active Problem List   Diagnosis Date Noted  . Internal hemorrhoids with bleeding and prolapse 02/15/2013  . Hernia, inguinal-right 01/22/2013  . HTN (hypertension)   . Diverticulitis   . Atrial Fibrillation   . Hyperlipidemia     Past Medical History  Diagnosis Date  . HTN (hypertension)   . Atrial Fibrillation     Stress ECHO-Negative (09/22/98)  . Hyperlipidemia   . Diverticulosis   . RMSF [facility] spotted fever) July 2005  . Anemia   . Hemorrhoids summer 2005    s/p ligation of internal hemorrhoids  . Adenomatous colon polyp     Past Surgical History  Procedure Laterality Date  . Throat surgery  childhood  . Hemorrhoid surgery  2005  . Cardiolite study  2004    negative  . Vasectomy    . Colonoscopy      History  Substance Use Topics  . Smoking status: Former Smoker -- 1.00 packs/day    Types: Cigarettes  . Smokeless tobacco: Former Systems developer    Quit date:  02/19/1998  . Alcohol Use: 1.2 oz/week    2 Glasses of wine per week     Comment: weekly    Family History  Problem Relation Age of Onset  . Coronary artery disease Father 63    s/p MI in earlly 70s  . Heart disease Father   . Parkinson's disease Father   . Hypertension Brother   . Heart disease Mother   . Hypertension Mother   . Arthritis Mother   . Cancer Maternal Grandmother   . Cancer Maternal Grandfather   . Heart attack Paternal Grandfather     No Known Allergies  Medication list has been reviewed and updated.  Current Outpatient Prescriptions on File Prior to Visit  Medication Sig Dispense Refill  . aspirin 81 MG tablet Take 81 mg by mouth daily.    . fish oil-omega-3 fatty acids 1000 MG capsule Take 1 g by mouth daily.    Marland Kitchen losartan (COZAAR) 25 MG tablet Take 1 tablet (25 mg total) by mouth daily. 90 tablet 3  . Multiple Vitamins-Minerals (MULTIVITAMIN PO) Take 1 Centrum Silver multivitamin by mouth daily    . simvastatin (ZOCOR) 20 MG tablet Take 1 tablet (20 mg total) by mouth at bedtime. Needs office visit 30 tablet 11  .  vitamin E 400 UNIT capsule Take 400 Units by mouth daily.     No current facility-administered medications on file prior to visit.    Review of Systems: ROS otherwise unremarkable unless listed below.   Physical Examination: Filed Vitals:   02/09/15 1630  BP: 142/81  Pulse: 65  Temp: 97.7 F (36.5 C)  Resp: 16   Filed Vitals:   02/09/15 1630  Height: 5' 8.25" (1.734 m)  Weight: 194 lb 9.6 oz (88.27 kg)   Body mass index is 29.36 kg/(m^2). Ideal Body Weight: Weight in (lb) to have BMI = 25: 165.3  Physical Exam  Constitutional: He is oriented to person, place, and time. He appears well-developed and well-nourished. No distress.  HENT:  Head: Normocephalic and atraumatic.  Right Ear: No drainage or tenderness. No mastoid tenderness. Tympanic membrane is injected. Tympanic membrane is not perforated.  Left Ear: Tympanic membrane,  external ear and ear canal normal. Tympanic membrane is not perforated.  Nose: Nose normal.  Mouth/Throat: Oropharynx is clear and moist. No oropharyngeal exudate.  Right ear with normal external ear and canal without vesicles or erythema.  TM has bullous at the 7'oclock region.  There is injection at the TM along malleus structure that increases superiorly.  Eyes: EOM are normal. Pupils are equal, round, and reactive to light. Right eye exhibits no discharge. Left eye exhibits no discharge.  Neck: No thyromegaly present.  Cardiovascular: Normal rate, regular rhythm and normal heart sounds.   No murmur heard. Pulmonary/Chest: Effort normal and breath sounds normal. No respiratory distress. He has no wheezes.  Lymphadenopathy:    He has no cervical adenopathy.  Neurological: He is alert and oriented to person, place, and time. No cranial nerve deficit.  Skin: Skin is warm and dry. He is not diaphoretic.  No facial rash noted  Psychiatric: He has a normal mood and affect. His behavior is normal.     Assessment and Plan: 64 year old male is here today with chief complaint of right sided ear pain.  -Instruction for immediate return if occurs, facial rash, weakness, or pain with possible concern of herpes zoster oticus, though unlikely  -Zpack ordered -Controlled analgesic for pain.    Bullous myringitis, right - Plan: azithromycin (ZITHROMAX) 250 MG tablet  Right ear pain - Plan: HYDROcodone-acetaminophen (NORCO) 5-325 MG per tablet, DISCONTINUED: HYDROcodone-acetaminophen (NORCO) 5-325 MG per tablet  Ivar Drape, PA-C [facility] 3/21/20166:06 PM

## 2015-02-09 NOTE — Patient Instructions (Signed)
Please return immediately if you have rash, increased pain, facial weakness as well.    Bullous Myringitis You have an infection of the ear drum called myringitis. Bullous means blisters have formed. These can produce clear or slightly bloody ear drainage. This infection can be caused by both viruses and germs (bacteria). Symptoms of myringitis include severe earache, slight hearing loss, and clear or bloody drainage from the ear. It is different from most ear infections in that there is no fluid trapped behind the ear drum. Treatment often includes antibiotic ear drops and pain medicine. Sometimes an oral antibiotic may be prescribed. Until the infection is resolved, you should keep water from entering your ear by plugging it with a cotton ball covered with petroleum jelly when you shower.  SEEK MEDICAL CARE IF:  You develop a cough or other symptoms of a respiratory infection.  Your symptoms are not improving after 2 days of treatment.  You develop a severe headache or stiff neck. Document Released: 12/15/2004 Document Revised: 01/30/2012 Document Reviewed: 11/04/2008 [employer] Patient Information 2015 [place], [place]. This information is not intended to replace advice given to you by your health care provider. Make sure you discuss any questions you have with your health care provider.

## 2015-02-10 NOTE — Progress Notes (Signed)
Patient discussed and examined with Ms. English. Agree with assessment and plan of care per her note.

## 2015-02-16 ENCOUNTER — Ambulatory Visit (INDEPENDENT_AMBULATORY_CARE_PROVIDER_SITE_OTHER): Payer: 59 | Admitting: Primary Care

## 2015-02-16 ENCOUNTER — Encounter: Payer: Self-pay | Admitting: Primary Care

## 2015-02-16 ENCOUNTER — Encounter (INDEPENDENT_AMBULATORY_CARE_PROVIDER_SITE_OTHER): Payer: Self-pay

## 2015-02-16 VITALS — BP 136/70 | HR 83 | Temp 98.0°F | Ht 68.0 in | Wt 190.1 lb

## 2015-02-16 DIAGNOSIS — R011 Cardiac murmur, unspecified: Secondary | ICD-10-CM

## 2015-02-16 DIAGNOSIS — E785 Hyperlipidemia, unspecified: Secondary | ICD-10-CM | POA: Diagnosis not present

## 2015-02-16 DIAGNOSIS — Z86010 Personal history of colonic polyps: Secondary | ICD-10-CM

## 2015-02-16 DIAGNOSIS — I10 Essential (primary) hypertension: Secondary | ICD-10-CM

## 2015-02-16 NOTE — Progress Notes (Signed)
Subjective:    Patient ID: Alec Wilson, male    DOB: 04/16/1951, 64 y.o.   MRN: [id number]  HPI  Alec Wilson is a 64 year old male who presents today to establish care and discuss the problems mentioned below. Will obtain old records.  1) Hypertension: Diagnosed several years ago and is currently taking losartan 25mg  daily. He has not been checking his blood pressure at home, but his last BP was 142/80 during his last appointment with a prior provider. Denies chest pain, shortness of breath, swelling, headaches.   2) Hyperlipidemia: Currently taking simvastatin 20mg  for numerous years for which he tolerates well. He endorses a healthy diet consisting of vegetables, pasta with veggies, lean white meats, and occasional fast food. His lunch is worst meal of the day which will include sub sandwiches with processed meats and chips. He's not been exercising but has started back at the [facility] and also plans to start walking around the golf course 3-4 days a week.  3) Atrial fibrillation: Once followed with Dr. Doreatha Lew at [facility] for paroxysmal atrial fibrillation. His last episode of "fluttering" was 18 months ago and lasted for 20 seconds. He's not had this sensation since, and was told by his cardiologist that he did not need to follow with them at the moment. Denies palpitations, fluttering, chest pain.  4) Inguinal hernia: Present to right side. He was referred to a surgeon in the Fall of 2013 for which he was told that it was not problematic enough for surgery and to continue to monitor.  5) Adenomatous colon polyp: Last colonoscopy was 2013. He was told to come back in 2015 for follow up but has not been since. He would like a referral today.  6) Rash: Has followed with dermatology for rash to bilateral hands. He reports they are better than the past. He's been putting on aquafor which has helped with control.  Review of Systems  Constitutional: Negative for unexpected weight change.   HENT: Negative for ear pain and rhinorrhea.   Respiratory: Negative for cough and shortness of breath.   Cardiovascular: Negative for chest pain.  Gastrointestinal: Positive for constipation. Negative for diarrhea.       Has bowel movements every 3 days. Will take stool softner. Has metamucil has not taken it daily.  Genitourinary: Negative for dysuria and frequency.  Skin: Positive for rash.       See HPI  Neurological: Negative for numbness and headaches.  Psychiatric/Behavioral:       Denies concerns for anxiety or depression       Past Medical History  Diagnosis Date  . HTN (hypertension)   . Atrial Fibrillation     Stress ECHO-Negative (09/22/98)  . Hyperlipidemia   . Diverticulosis   . 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 Anemia   . Hemorrhoids summer 2005    s/p ligation of internal hemorrhoids  . Adenomatous colon polyp     last colonoscopy was 3 years ago.  . Inguinal hernia     History   Social History  . Marital Status: Married    Spouse Name: N/A  . Number of Children: N/A  . Years of Education: N/A   Occupational History  . Not on file.   Social History Main Topics  . Smoking status: Former Smoker -- 1.00 packs/day    Types: Cigarettes  . Smokeless tobacco: Former Systems developer    Quit date: 02/19/1998  . Alcohol Use: 1.2 oz/week  2 Glasses of wine per week     Comment: weekly  . Drug Use: No  . Sexual Activity: Yes    Birth Control/ Protection: Surgical   Other Topics Concern  . Not on file   Social History Narrative   From [place].   Works at [employer] in [employer].   Married.   2 children: 39, 27. Daughters live in [place] and [place].   Enjoys golfing, visiting vineyards, traveling.       Past Surgical History  Procedure Laterality Date  . Throat surgery  childhood  . Hemorrhoid surgery  2005  . Cardiolite study  2004    negative  . Vasectomy    . Colonoscopy  2013    Family History  Problem Relation  Age of Onset  . Coronary artery disease Father 80    s/p MI in earlly 70s  . Heart disease Father   . Parkinson's disease Father   . Hypertension Brother   . Heart disease Mother   . Hypertension Mother   . Arthritis Mother   . Cancer Maternal Grandmother   . Cancer Maternal Grandfather   . Heart attack Paternal Grandfather     No Known Allergies  Current Outpatient Prescriptions on File Prior to Visit  Medication Sig Dispense Refill  . aspirin 81 MG tablet Take 81 mg by mouth daily.    . fish oil-omega-3 fatty acids 1000 MG capsule Take 1 g by mouth daily.    Marland Kitchen HYDROcodone-acetaminophen (NORCO) 5-325 MG per tablet Take 1 tablet by mouth every 6 (six) hours as needed. 20 tablet 0  . losartan (COZAAR) 25 MG tablet Take 1 tablet (25 mg total) by mouth daily. 90 tablet 3  . Multiple Vitamins-Minerals (MULTIVITAMIN PO) Take 1 Centrum Silver multivitamin by mouth daily    . simvastatin (ZOCOR) 20 MG tablet Take 1 tablet (20 mg total) by mouth at bedtime. Needs office visit 30 tablet 11  . vitamin E 400 UNIT capsule Take 400 Units by mouth daily.     No current facility-administered medications on file prior to visit.    BP 136/70 mmHg  Pulse 83  Temp(Src) 98 F (36.7 C) (Oral)  Ht 5\' 8"  (1.727 m)  Wt 190 lb 1.9 oz (86.238 kg)  BMI 28.91 kg/m2  SpO2 97%    Objective:   Physical Exam  Constitutional: He is oriented to person, place, and time. He appears well-developed.  HENT:  Head: Normocephalic.  Right Ear: External ear normal.  Left Ear: External ear normal.  Nose: Nose normal.  Mouth/Throat: Oropharynx is clear and moist.  Eyes: Conjunctivae and EOM are normal. Pupils are equal, round, and reactive to light.  Neck: Neck supple.  Cardiovascular: Normal rate, regular rhythm, normal heart sounds and intact distal pulses.   No murmur heard. Pulmonary/Chest: Effort normal and breath sounds normal.  Abdominal: Soft. Bowel sounds are normal. There is no tenderness.    Musculoskeletal: Normal range of motion.  Lymphadenopathy:    He has no cervical adenopathy.  Neurological: He is alert and oriented to person, place, and time. He has normal reflexes. No cranial nerve deficit.  Skin: Skin is warm and dry. Rash noted.  Subtle papular rash to posterior hands. Dry skin present.  Psychiatric: He has a normal mood and affect.          Assessment & Plan:

## 2015-02-16 NOTE — Patient Instructions (Signed)
Obtain lab work prior to leaving today. Start measuring your blood pressure at home once daily and begin recording these readings. You will be contacted regarding your referral to GI for your colonoscopy. Schedule a fasting physical with me in one month. Don't hesitate to call me if you have any questions. Welcome to [facility]!

## 2015-02-16 NOTE — Progress Notes (Signed)
Pre visit review using our clinic review tool, if applicable. No additional management support is needed unless otherwise documented below in the visit note. 

## 2015-02-17 ENCOUNTER — Telehealth: Payer: Self-pay | Admitting: Primary Care

## 2015-02-17 DIAGNOSIS — Z860100 Personal history of colon polyps, unspecified: Secondary | ICD-10-CM | POA: Insufficient documentation

## 2015-02-17 LAB — BASIC METABOLIC PANEL
BUN: 15 mg/dL (ref 6–23)
CHLORIDE: 103 meq/L (ref 96–112)
CO2: 30 mEq/L (ref 19–32)
Calcium: 9.6 mg/dL (ref 8.4–10.5)
Creatinine, Ser: 0.96 mg/dL (ref 0.40–1.50)
GFR: 84.48 mL/min (ref >60.00)
Glucose, Bld: 79 mg/dL (ref 70–99)
Potassium: 4.7 mEq/L (ref 3.5–5.1)
SODIUM: 137 meq/L (ref 135–145)

## 2015-02-17 NOTE — Assessment & Plan Note (Signed)
Controlled on Losartan 25mg , no chest pain, headaches. He is working on improving his diet and starting an exercise regimen. Will continue to monitor. BMET today.

## 2015-02-17 NOTE — Telephone Encounter (Signed)
Patient did go to lab that afternoon. It just didn't show up but it is now.

## 2015-02-17 NOTE — Assessment & Plan Note (Signed)
Will obtain at physical and have requested old records. Counseling provided on healthy diet and importance of exercise regimen.

## 2015-02-17 NOTE — Assessment & Plan Note (Signed)
Due for colonoscopy. Referral made.

## 2015-02-17 NOTE — Telephone Encounter (Signed)
Will you please call Alec Wilson and find out the reason he did not obtain his BMET yesterday prior to leaving?  Thank you!

## 2015-02-17 NOTE — Assessment & Plan Note (Signed)
No palpitations or fluttering for past 18 months. Educated on s/s and told patient to notify me of next episode. Currently on ASA 81mg  daily

## 2015-04-08 ENCOUNTER — Ambulatory Visit (AMBULATORY_SURGERY_CENTER): Payer: Self-pay | Admitting: *Deleted

## 2015-04-08 VITALS — Ht 69.0 in | Wt 198.0 lb

## 2015-04-08 DIAGNOSIS — Z86010 Personal history of colonic polyps: Secondary | ICD-10-CM

## 2015-04-08 NOTE — Progress Notes (Signed)
No egg or soy allergy No home 02 No diet pills No issues with past sedation No emmi per pt/

## 2015-04-22 ENCOUNTER — Encounter: Payer: Self-pay | Admitting: Internal Medicine

## 2015-04-22 ENCOUNTER — Ambulatory Visit (AMBULATORY_SURGERY_CENTER): Payer: 59 | Admitting: Internal Medicine

## 2015-04-22 VITALS — BP 121/77 | HR 51 | Temp 97.3°F | Resp 23 | Ht 69.0 in | Wt 198.0 lb

## 2015-04-22 DIAGNOSIS — D124 Benign neoplasm of descending colon: Secondary | ICD-10-CM | POA: Diagnosis not present

## 2015-04-22 DIAGNOSIS — K5730 Diverticulosis of large intestine without perforation or abscess without bleeding: Secondary | ICD-10-CM

## 2015-04-22 DIAGNOSIS — Z86010 Personal history of colonic polyps: Secondary | ICD-10-CM | POA: Diagnosis present

## 2015-04-22 MED ORDER — SODIUM CHLORIDE 0.9 % IV SOLN: 500.0000 mL | INTRAVENOUS | Status: DC

## 2015-04-22 NOTE — Progress Notes (Signed)
Patient awakening,vss,report to rn 

## 2015-04-22 NOTE — Op Note (Signed)
[facility]  [address] [place], [id number]   COLONOSCOPY PROCEDURE REPORT  PATIENT: Alec Wilson, Alec Wilson  MR#: [id number] BIRTHDATE: 04-17-1951 , 63  yrs. old GENDER: male ENDOSCOPIST: Gatha Mayer, MD, [facility] PROCEDURE DATE:  04/22/2015 PROCEDURE:   Colonoscopy with snare polypectomy and Colonoscopy, surveillance First Screening Colonoscopy - Avg.  risk and is 50 yrs.  old or older - No.  Prior Negative Screening - Now for repeat screening. N/A  History of Adenoma - Now for follow-up colonoscopy & has been > or = to 3 yrs.  Yes hx of adenoma.  Has been 3 or more years since last colonoscopy.  Polyps removed today? Yes ASA CLASS:   Class II INDICATIONS:Surveillance due to prior colonic neoplasia and PH Colon Adenoma. MEDICATIONS: Propofol 250 mg IV and Monitored anesthesia care  DESCRIPTION OF PROCEDURE:   After the risks benefits and alternatives of the procedure were thoroughly explained, informed consent was obtained.  The digital rectal exam revealed no abnormalities of the rectum, revealed no prostatic nodules, and revealed the prostate was not enlarged.   The LB CF-H180AL Loaner [id number]  endoscope was introduced through the anus and advanced to the cecum, which was identified by both the appendix and ileocecal valve. No adverse events experienced.   The quality of the prep was excellent.  (MiraLax was used)  The instrument was then slowly withdrawn as the colon was fully examined. Estimated blood loss is zero unless otherwise noted in this procedure report.  COLON FINDINGS: A sessile polyp measuring 5 mm in size was found in the descending colon.  A polypectomy was performed with a cold snare.  The resection was complete, the polyp tissue was completely retrieved and sent to histology.   There was moderate diverticulosis noted in the sigmoid colon.   The examination was otherwise normal.  Retroflexed views revealed no abnormalities. The time to cecum = 2.7  Withdrawal time = 11.4   The scope was withdrawn and the procedure completed. COMPLICATIONS: There were no immediate complications.  ENDOSCOPIC IMPRESSION: 1.   Sessile polyp was found in the descending colon; polypectomy was performed with a cold snare 2.   Moderate diverticulosis was noted in the sigmoid colon 3.   The examination was otherwise normal  RECOMMENDATIONS: Timing of repeat colonoscopy will be determined by pathology findings.  eSigned:  Gatha Mayer, MD, [facility] 04/22/2015 9:43 AM   cc: The Patient

## 2015-04-22 NOTE — Patient Instructions (Addendum)
One polyp today. Colon polyp handout given. I suspect repeat exam in 5 years. I will let you know pathology results and when to have another routine colonoscopy by mail.  You also have a condition called diverticulosis - common and not usually a problem. Please read the handout provided.  I appreciate the opportunity to care for you. Gatha Mayer, MD, FACG  YOU HAD AN ENDOSCOPIC PROCEDURE TODAY AT [facility]:   Refer to the procedure report that was given to you for any specific questions about what was found during the examination.  If the procedure report does not answer your questions, please call your gastroenterologist to clarify.  If you requested that your care partner not be given the details of your procedure findings, then the procedure report has been included in a sealed envelope for you to review at your convenience later.  YOU SHOULD EXPECT: Some feelings of bloating in the abdomen. Passage of more gas than usual.  Walking can help get rid of the air that was put into your GI tract during the procedure and reduce the bloating. If you had a lower endoscopy (such as a colonoscopy or flexible sigmoidoscopy) you may notice spotting of blood in your stool or on the toilet paper. If you underwent a bowel prep for your procedure, you may not have a normal bowel movement for a few days.  Please Note:  You might notice some irritation and congestion in your nose or some drainage.  This is from the oxygen used during your procedure.  There is no need for concern and it should clear up in a day or so.  SYMPTOMS TO REPORT IMMEDIATELY:   Following lower endoscopy (colonoscopy or flexible sigmoidoscopy):  Excessive amounts of blood in the stool  Significant tenderness or worsening of abdominal pains  Swelling of the abdomen that is new, acute  Fever of 100F or higher   For urgent or emergent issues, a gastroenterologist can be reached at any hour by calling  [phone].   DIET: Your first meal following the procedure should be a small meal and then it is ok to progress to your normal diet. Heavy or fried foods are harder to digest and may make you feel nauseous or bloated.  Likewise, meals heavy in dairy and vegetables can increase bloating.  Drink plenty of fluids but you should avoid alcoholic beverages for 24 hours.  ACTIVITY:  You should plan to take it easy for the rest of today and you should NOT DRIVE or use heavy machinery until tomorrow (because of the sedation medicines used during the test).    FOLLOW UP: Our staff will call the number listed on your records the next business day following your procedure to check on you and address any questions or concerns that you may have regarding the information given to you following your procedure. If we do not reach you, we will leave a message.  However, if you are feeling well and you are not experiencing any problems, there is no need to return our call.  We will assume that you have returned to your regular daily activities without incident.  If any biopsies were taken you will be contacted by phone or by letter within the next 1-3 weeks.  Please call us at [phone] if you have not heard about the biopsies in 3 weeks.    SIGNATURES/CONFIDENTIALITY: You and/or your care partner have signed paperwork which will be entered into your electronic  medical record.  These signatures attest to the fact that that the information above on your After Visit Summary has been reviewed and is understood.  Full responsibility of the confidentiality of this discharge information lies with you and/or your care-partner.

## 2015-04-22 NOTE — Progress Notes (Signed)
Called to room to assist during endoscopic procedure.  Patient ID and intended procedure confirmed with present staff. Received instructions for my participation in the procedure from the performing physician.  

## 2015-04-23 ENCOUNTER — Telehealth: Payer: Self-pay | Admitting: *Deleted

## 2015-04-23 NOTE — Telephone Encounter (Signed)
  Follow up Call-  Call back number 04/22/2015  Post procedure Call Back phone  # [phone]  Permission to leave phone message Yes     Patient questions:  Do you have a fever, pain , or abdominal swelling? No. Pain Score  0 *  Have you tolerated food without any problems? Yes.    Have you been able to return to your normal activities? Yes.    Do you have any questions about your discharge instructions: Diet   No. Medications  No. Follow up visit  No.  Do you have questions or concerns about your Care? No.  Actions: * If pain score is 4 or above: No action needed, pain <4.

## 2015-04-28 ENCOUNTER — Encounter: Payer: Self-pay | Admitting: Internal Medicine

## 2015-04-28 DIAGNOSIS — Z86010 Personal history of colonic polyps: Secondary | ICD-10-CM

## 2015-04-28 NOTE — Assessment & Plan Note (Signed)
04/2015 - diminutive adenoma - repeat colon 2021

## 2015-04-28 NOTE — Progress Notes (Signed)
Quick Note:  Diminutive adenoma - repeat colon 2021 ______

## 2015-05-05 ENCOUNTER — Encounter: Payer: 59 | Admitting: Emergency Medicine

## 2015-05-08 ENCOUNTER — Other Ambulatory Visit: Payer: Self-pay | Admitting: Emergency Medicine

## 2015-05-11 ENCOUNTER — Encounter: Payer: Self-pay | Admitting: Primary Care

## 2015-05-11 ENCOUNTER — Ambulatory Visit (INDEPENDENT_AMBULATORY_CARE_PROVIDER_SITE_OTHER): Payer: 59 | Admitting: Primary Care

## 2015-05-11 VITALS — BP 132/84 | HR 55 | Temp 97.6°F | Ht 69.0 in | Wt 192.8 lb

## 2015-05-11 DIAGNOSIS — Z0000 Encounter for general adult medical examination without abnormal findings: Secondary | ICD-10-CM | POA: Diagnosis not present

## 2015-05-11 DIAGNOSIS — E785 Hyperlipidemia, unspecified: Secondary | ICD-10-CM | POA: Diagnosis not present

## 2015-05-11 DIAGNOSIS — I10 Essential (primary) hypertension: Secondary | ICD-10-CM | POA: Diagnosis not present

## 2015-05-11 DIAGNOSIS — Z0001 Encounter for general adult medical examination with abnormal findings: Secondary | ICD-10-CM | POA: Insufficient documentation

## 2015-05-11 DIAGNOSIS — Z125 Encounter for screening for malignant neoplasm of prostate: Secondary | ICD-10-CM | POA: Diagnosis not present

## 2015-05-11 DIAGNOSIS — Z86010 Personal history of colonic polyps: Secondary | ICD-10-CM

## 2015-05-11 DIAGNOSIS — R011 Cardiac murmur, unspecified: Secondary | ICD-10-CM

## 2015-05-11 LAB — COMPREHENSIVE METABOLIC PANEL
ALT: 14 U/L (ref 0–53)
AST: 15 U/L (ref 0–37)
Albumin: 4 g/dL (ref 3.5–5.2)
Alkaline Phosphatase: 88 U/L (ref 39–117)
BILIRUBIN TOTAL: 0.7 mg/dL (ref 0.2–1.2)
BUN: 14 mg/dL (ref 6–23)
CO2: 27 meq/L (ref 19–32)
Calcium: 9.2 mg/dL (ref 8.4–10.5)
Chloride: 104 mEq/L (ref 96–112)
Creatinine, Ser: 0.76 mg/dL (ref 0.40–1.50)
GFR: 110.54 mL/min (ref >60.00)
Glucose, Bld: 88 mg/dL (ref 70–99)
Potassium: 4.6 mEq/L (ref 3.5–5.1)
SODIUM: 135 meq/L (ref 135–145)
Total Protein: 6.3 g/dL (ref 6.0–8.3)

## 2015-05-11 LAB — CBC
HCT: 44.1 % (ref 39.0–52.0)
Hemoglobin: 15.1 g/dL (ref 13.0–17.0)
MCHC: 34.3 g/dL (ref 30.0–36.0)
MCV: 94.2 fl (ref 78.0–100.0)
PLATELETS: 249 10*3/uL (ref 150.0–400.0)
RBC: 4.68 Mil/uL (ref 4.22–5.81)
RDW: 13.6 % (ref 11.5–15.5)
WBC: 6.1 10*3/uL (ref 4.0–10.5)

## 2015-05-11 LAB — LIPID PANEL
Cholesterol: 179 mg/dL (ref 0–200)
HDL: 60 mg/dL (ref >39.00)
LDL Cholesterol: 86 mg/dL (ref 0–99)
NonHDL: 119
TRIGLYCERIDES: 164 mg/dL — AB (ref 0.0–149.0)
Total CHOL/HDL Ratio: 3
VLDL: 32.8 mg/dL (ref 0.0–40.0)

## 2015-05-11 LAB — PSA: PSA: 1.24 ng/mL (ref 0.10–4.00)

## 2015-05-11 LAB — HEMOGLOBIN A1C: HEMOGLOBIN A1C: 5.4 % (ref 4.6–6.5)

## 2015-05-11 MED ORDER — SIMVASTATIN 20 MG PO TABS: ORAL_TABLET | ORAL | Status: DC

## 2015-05-11 MED ORDER — LOSARTAN POTASSIUM 25 MG PO TABS: ORAL_TABLET | ORAL | Status: DC

## 2015-05-11 NOTE — Assessment & Plan Note (Signed)
Tetanus up to date. Shingles due. Will schedule nurse visit as he does not want the vaccine today. Pneumonia due in 4 years. Labs today. Exam unremarkable. Discussed importance for his continual efforts towards a healthy lifestyle. Follow up in 6 months.

## 2015-05-11 NOTE — Assessment & Plan Note (Signed)
Regular rate and rhythm today. Continue aspirin 81 mg. 

## 2015-05-11 NOTE — Progress Notes (Signed)
Subjective:    Patient ID: Alec Wilson, male    DOB: [birth year], 63 y.o.   MRN: [id number]  HPI  Mr. Rusnak is a 64 year old male who presents today for complete physical.  Immunizations: -Tetanus: Tdap completed in 2008, believes he had one 2 years ago.  -Influenza: Completed last season. -Pneumonia: Never completed. He is 77. -Shingles: Never completed.  Diet: Working to improve. Breakfast: Egg whites, canadian bacon, fruit, cereal. Lunch: Sandwich ([place], ham), chips, apple, snack bar. Dinner: Pasta, veggies, limited meats (hamburgers, hot dogs), Desserts: dark chocolate, snickers bar. Drinks 1 bottle per week. Drinks mostly water throughout the day. Very little soda consumption.  Exercise: Joined the [employer] works out three nights weekly, walks the golf course at night. Eye exam: Completed 2-3 years ago.  Dental exam: Completed 2-3 months ago. Colonoscopy: Completed in June 2016. One polyp removed (precancerous). Follow up needed in 5 years.  Wt Readings from Last 3 Encounters:  05/11/15 192 lb 12.8 oz (87.454 kg)  04/22/15 198 lb (89.812 kg)  04/08/15 198 lb (89.812 kg)    BP Readings from Last 3 Encounters:  05/11/15 132/84  04/22/15 121/77  02/16/15 136/70     Review of Systems  Constitutional: Negative for unexpected weight change.  HENT: Negative for rhinorrhea.   Respiratory: Negative for cough and shortness of breath.   Cardiovascular: Negative for chest pain.  Gastrointestinal: Negative for diarrhea and constipation.  Genitourinary: Negative for dysuria, frequency and difficulty urinating.  Musculoskeletal: Negative for myalgias and arthralgias.  Skin: Negative for rash.  Allergic/Immunologic: Negative for environmental allergies.  Neurological: Negative for dizziness and headaches.  Psychiatric/Behavioral:       Denies concerns for anxiety or depression.        Past Medical History  Diagnosis Date  . HTN (hypertension)   . Atrial Fibrillation    Stress ECHO-Negative (09/22/98)  . Hyperlipidemia   . Diverticulosis   . 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 Anemia   . Hemorrhoids summer 2005    s/p ligation of internal hemorrhoids  . Adenomatous colon polyp     last colonoscopy was 3 years ago.  . Inguinal hernia     History   Social History  . Marital Status: Married    Spouse Name: N/A  . Number of Children: N/A  . Years of Education: N/A   Occupational History  . Not on file.   Social History Main Topics  . Smoking status: Former Smoker -- 1.00 packs/day    Types: Cigarettes  . Smokeless tobacco: Former Systems developer    Quit date: 02/19/1998  . Alcohol Use: 1.2 oz/week    2 Glasses of wine per week     Comment: weekly  . Drug Use: No  . Sexual Activity: Yes    Birth Control/ Protection: Surgical   Other Topics Concern  . Not on file   Social History Narrative   From [place].   Works at [employer] in [employer].   Married.   2 children: 39, 27. Daughters live in [place] and [place].   Enjoys golfing, visiting vineyards, traveling.       Past Surgical History  Procedure Laterality Date  . Throat surgery  childhood  . Hemorrhoid surgery  2005  . Cardiolite study  2004    negative  . Vasectomy    . Colonoscopy  2013    Family History  Problem Relation Age of Onset  . Coronary artery  disease Father 27    s/p MI in earlly 49s  . Heart disease Father   . Parkinson's disease Father   . Colon polyps Father   . Hypertension Brother   . Heart disease Mother   . Hypertension Mother   . Arthritis Mother   . Cancer Maternal Grandmother   . Cancer Maternal Grandfather   . Heart attack Paternal Grandfather   . Rectal cancer Neg Hx   . Stomach cancer Neg Hx   . Colon cancer Neg Hx   . Esophageal cancer Neg Hx     No Known Allergies  Current Outpatient Prescriptions on File Prior to Visit  Medication Sig Dispense Refill  . aspirin 81 MG tablet Take 81 mg by mouth daily.    .  fish oil-omega-3 fatty acids 1000 MG capsule Take 1 g by mouth daily.    . Multiple Vitamins-Minerals (MULTIVITAMIN PO) Take 1 Centrum Silver multivitamin by mouth daily    . vitamin E 400 UNIT capsule Take 400 Units by mouth daily.     No current facility-administered medications on file prior to visit.    BP 132/84 mmHg  Pulse 55  Temp(Src) 97.6 F (36.4 C) (Oral)  Ht 5\' 9"  (1.753 m)  Wt 192 lb 12.8 oz (87.454 kg)  BMI 28.46 kg/m2  SpO2 98%    Objective:   Physical Exam  Constitutional: He is oriented to person, place, and time. He appears well-nourished.  HENT:  Right Ear: Tympanic membrane and ear canal normal.  Left Ear: Tympanic membrane and ear canal normal.  Nose: Nose normal.  Mouth/Throat: Oropharynx is clear and moist.  Eyes: EOM are normal. Pupils are equal, round, and reactive to light.  Neck: Neck supple.  Cardiovascular: Normal rate and regular rhythm.   Pulmonary/Chest: Effort normal and breath sounds normal.  Abdominal: Soft. Bowel sounds are normal. There is no tenderness.  Musculoskeletal: Normal range of motion.  Neurological: He is alert and oriented to person, place, and time. He has normal reflexes. No cranial nerve deficit.  Skin: Skin is warm and dry.  Psychiatric: He has a normal mood and affect.          Assessment & Plan:

## 2015-05-11 NOTE — Assessment & Plan Note (Addendum)
Colonoscopy completed June 2016, precancerous polyp removed. Repeat in 5 years. Denies bloody stools.

## 2015-05-11 NOTE — Progress Notes (Signed)
Pre visit review using our clinic review tool, if applicable. No additional management support is needed unless otherwise documented below in the visit note. 

## 2015-05-11 NOTE — Patient Instructions (Addendum)
Complete lab work prior to leaving today. I will notify you of your results.  Schedule a nurse visit for the shingles vaccine within the next month at your convenience.  Follow up in 6 months for check up. It was nice seeing you!

## 2015-05-11 NOTE — Assessment & Plan Note (Addendum)
Stable on losartan 25 mg. He's working on his diet and exercise. Continue same. Follow up in 6 months.

## 2015-05-11 NOTE — Assessment & Plan Note (Signed)
Managed on simvastatin. Labs today to check lipids. Diet slightly improved. He's now exercising regularly.

## 2015-05-13 ENCOUNTER — Encounter: Payer: Self-pay | Admitting: *Deleted

## 2015-06-03 ENCOUNTER — Encounter: Payer: Self-pay | Admitting: Primary Care

## 2015-06-03 NOTE — Telephone Encounter (Signed)
Called Patient and notified him that Rx were sent to [facility] on 05/11/15. Patient will check with [facility]. Will let us know if Rx not there.

## 2015-06-09 ENCOUNTER — Other Ambulatory Visit: Payer: Self-pay | Admitting: Emergency Medicine

## 2015-06-11 ENCOUNTER — Ambulatory Visit: Payer: 59

## 2015-12-03 ENCOUNTER — Other Ambulatory Visit: Payer: Self-pay | Admitting: Primary Care

## 2015-12-04 NOTE — Telephone Encounter (Signed)
Electronically refill request for   losartan (COZAAR) 25 MG tablet   TAKE 1 TABLET (25 MG TOTAL) BY MOUTH DAILY.  Dispense: 30 tablet   Refills: 5     simvastatin (ZOCOR) 20 MG tablet   TAKE 1 TABLET (20 MG TOTAL) BY MOUTH AT BEDTIME.  Dispense: 30 tablet   Refills: 5      Last prescribed on 05/11/2015. Last seen on 05/11/2015 for CPE. No future appointment.

## 2015-12-04 NOTE — Telephone Encounter (Signed)
Message left for patient to return my call.  

## 2016-01-06 ENCOUNTER — Other Ambulatory Visit: Payer: Self-pay | Admitting: Primary Care

## 2016-01-14 ENCOUNTER — Other Ambulatory Visit: Payer: Self-pay | Admitting: Primary Care

## 2016-01-14 DIAGNOSIS — E785 Hyperlipidemia, unspecified: Secondary | ICD-10-CM

## 2016-01-14 DIAGNOSIS — I10 Essential (primary) hypertension: Secondary | ICD-10-CM

## 2016-01-14 NOTE — Telephone Encounter (Signed)
Electronically refill request for   simvastatin (ZOCOR) 20 MG tablet   TAKE 1 TABLET (20 MG TOTAL) BY MOUTH AT BEDTIME.  Dispense: 90 tablet   Refills: 0     losartan (COZAAR) 25 MG tablet   TAKE 1 TABLET (25 MG TOTAL) BY MOUTH DAILY.  Dispense: 90 tablet   Refills: 0       Last prescribed on 12/04/2015. Last seen on 05/11/2015. No future appointment.

## 2016-02-09 IMAGING — CR DG HIP (WITH OR WITHOUT PELVIS) 2-3V*L*
1 series · 1 of 1 positions shown · non-contrast
Comparison: Right hip series with AP pelvis of today's date.

CLINICAL DATA: Bilateral hip pain, progressive

EXAM:
LEFT HIP - COMPLETE 2+ VIEW

[lateral]
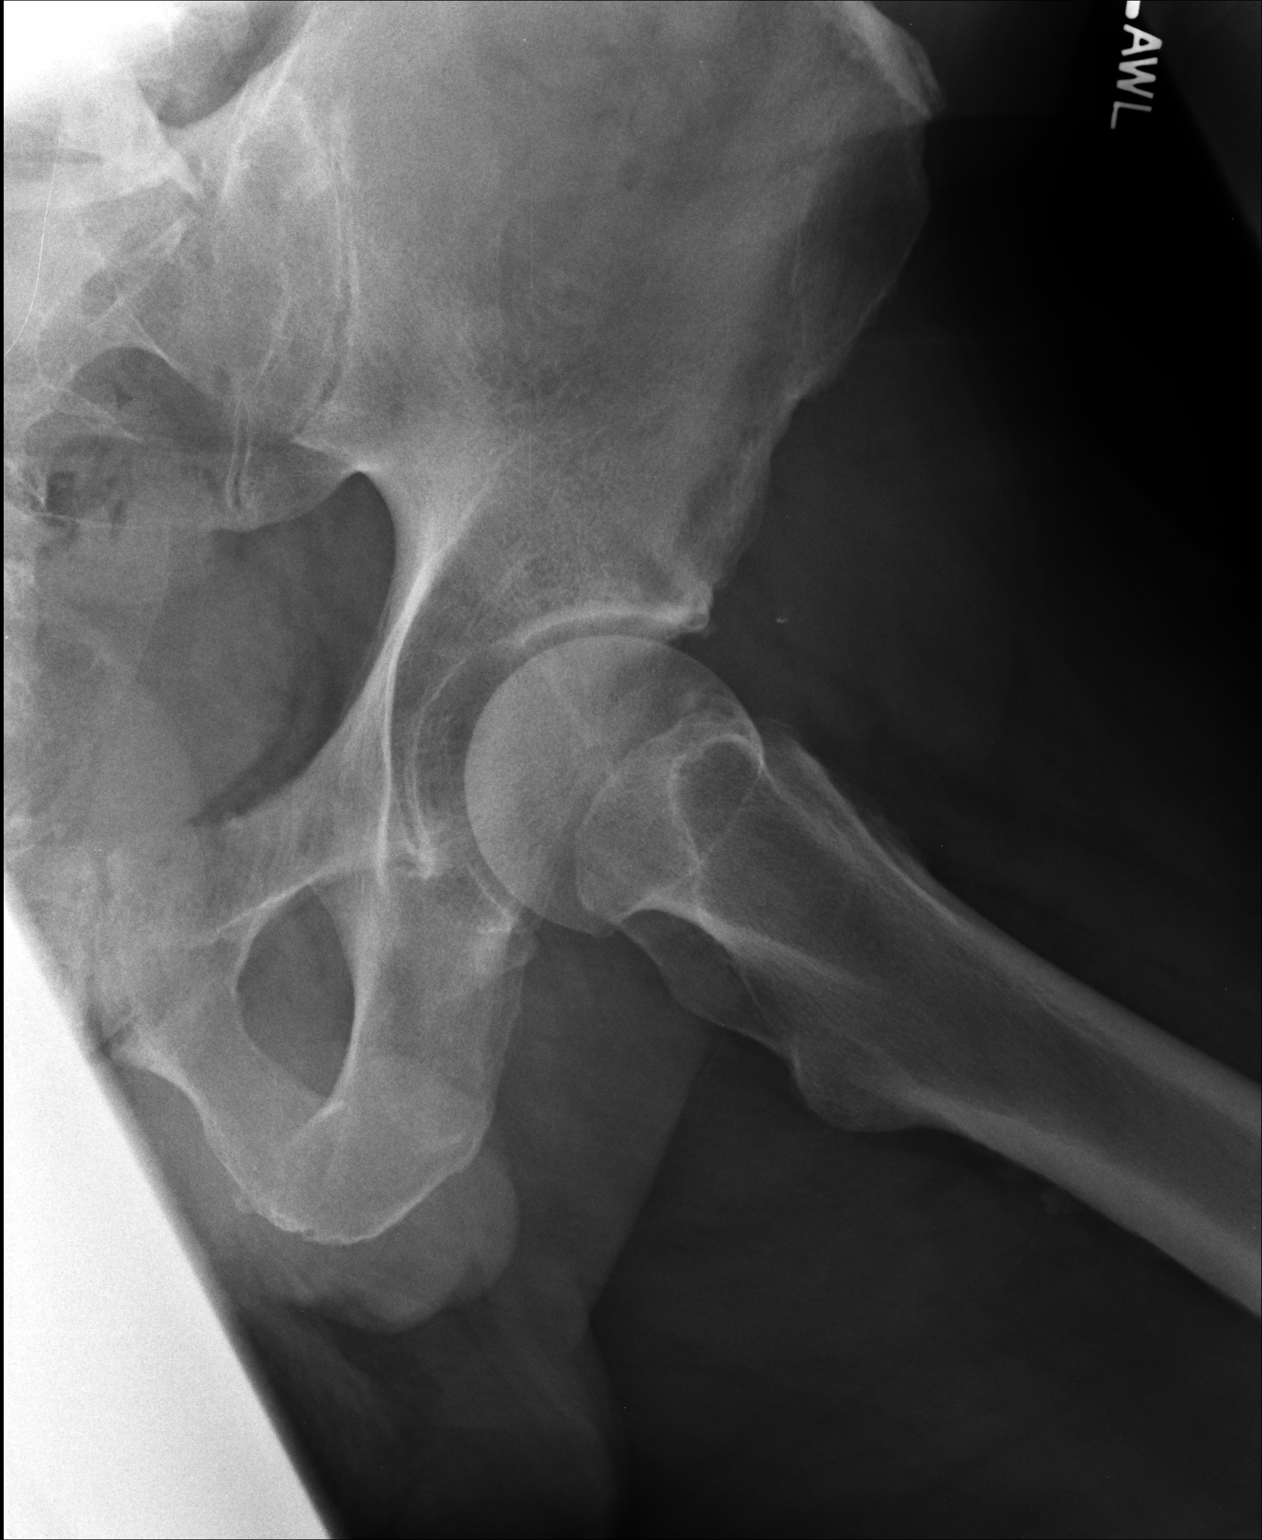

[1 of 1 positions shown; findings below may reference images not displayed]

FINDINGS: A single frogleg lateral view of the left hip reveals the bones to
be adequately mineralized. The joint space is preserved. There is no
significant osteophyte formation. The overlying soft tissues are
normal.

On the accompanying right hip series which includes the AP view of
the pelvis the left hip appears normal.
IMPRESSION: There is no acute bony abnormality of the left hip on this frog leg
lateral view.

## 2016-02-12 ENCOUNTER — Other Ambulatory Visit: Payer: Self-pay | Admitting: Primary Care

## 2016-02-20 ENCOUNTER — Other Ambulatory Visit: Payer: Self-pay | Admitting: Primary Care

## 2016-02-22 ENCOUNTER — Ambulatory Visit (INDEPENDENT_AMBULATORY_CARE_PROVIDER_SITE_OTHER): Payer: 59 | Admitting: Primary Care

## 2016-02-22 VITALS — BP 120/78 | HR 58 | Temp 97.6°F | Wt 194.0 lb

## 2016-02-22 DIAGNOSIS — R059 Cough, unspecified: Secondary | ICD-10-CM

## 2016-02-22 DIAGNOSIS — I10 Essential (primary) hypertension: Secondary | ICD-10-CM | POA: Diagnosis not present

## 2016-02-22 DIAGNOSIS — R05 Cough: Secondary | ICD-10-CM | POA: Diagnosis not present

## 2016-02-22 DIAGNOSIS — E785 Hyperlipidemia, unspecified: Secondary | ICD-10-CM | POA: Diagnosis not present

## 2016-02-22 MED ORDER — BENZONATATE 200 MG PO CAPS: 200.0000 mg | ORAL_CAPSULE | Freq: Three times a day (TID) | ORAL | Status: DC | PRN

## 2016-02-22 MED ORDER — LOSARTAN POTASSIUM 25 MG PO TABS: 25.0000 mg | ORAL_TABLET | Freq: Every day | ORAL | Status: DC

## 2016-02-22 MED ORDER — SIMVASTATIN 20 MG PO TABS: 20.0000 mg | ORAL_TABLET | Freq: Every day | ORAL | Status: DC

## 2016-02-22 NOTE — Progress Notes (Signed)
Pre visit review using our clinic review tool, if applicable. No additional management support is needed unless otherwise documented below in the visit note. 

## 2016-02-22 NOTE — Progress Notes (Signed)
Subjective:    Patient ID: Alec Wilson, male    DOB: 08/18/1951, 65 y.o.   MRN: [id number]  HPI  Mr. Filter is a 65 year old male who presents today with a chief complaint of cough. He also reports sore throat, fever, congestion, body aches. His symptoms began Thursday last week, and became worse Sunday (cough and body aches). His cough is mostly non productive. He's also had low grade fevers for the past several days. He's taken aspirin and Nyquil with improvement. Denies sick contacts.   Review of Systems  Constitutional: Positive for fever, chills and fatigue.  HENT: Positive for congestion, sinus pressure and sore throat.   Respiratory: Positive for cough. Negative for shortness of breath and wheezing.   Musculoskeletal: Positive for myalgias.       Past Medical History  Diagnosis Date  . HTN (hypertension)   . Atrial Fibrillation     Stress ECHO-Negative (09/22/98)  . Hyperlipidemia   . Diverticulosis   . 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 Anemia   . Hemorrhoids summer 2005    s/p ligation of internal hemorrhoids  . Adenomatous colon polyp     last colonoscopy was 3 years ago.  . Inguinal hernia     Social History   Social History  . Marital Status: Married    Spouse Name: N/A  . Number of Children: N/A  . Years of Education: N/A   Occupational History  . Not on file.   Social History Main Topics  . Smoking status: Former Smoker -- 1.00 packs/day    Types: Cigarettes  . Smokeless tobacco: Former Systems developer    Quit date: 02/19/1998  . Alcohol Use: 1.2 oz/week    2 Glasses of wine per week     Comment: weekly  . Drug Use: No  . Sexual Activity: Yes    Birth Control/ Protection: Surgical   Other Topics Concern  . Not on file   Social History Narrative   From [place].   Works at [employer] in [employer].   Married.   2 children: 39, 27. Daughters live in [place] and [place].   Enjoys golfing, visiting vineyards, traveling.        Past Surgical History  Procedure Laterality Date  . Throat surgery  childhood  . Hemorrhoid surgery  2005  . Cardiolite study  2004    negative  . Vasectomy    . Colonoscopy  2013    Family History  Problem Relation Age of Onset  . Coronary artery disease Father 68    s/p MI in earlly 70s  . Heart disease Father   . Parkinson's disease Father   . Colon polyps Father   . Hypertension Brother   . Heart disease Mother   . Hypertension Mother   . Arthritis Mother   . Cancer Maternal Grandmother   . Cancer Maternal Grandfather   . Heart attack Paternal Grandfather   . Rectal cancer Neg Hx   . Stomach cancer Neg Hx   . Colon cancer Neg Hx   . Esophageal cancer Neg Hx     No Known Allergies  Current Outpatient Prescriptions on File Prior to Visit  Medication Sig Dispense Refill  . aspirin 81 MG tablet Take 81 mg by mouth daily.    . fish oil-omega-3 fatty acids 1000 MG capsule Take 1 g by mouth daily.    . Multiple Vitamins-Minerals (MULTIVITAMIN PO) Take 1 Centrum Silver multivitamin  by mouth daily    . vitamin E 400 UNIT capsule Take 400 Units by mouth daily.     No current facility-administered medications on file prior to visit.    BP 120/78 mmHg  Pulse 58  Temp(Src) 97.6 F (36.4 C) (Oral)  Wt 194 lb (87.998 kg)  SpO2 97%    Objective:   Physical Exam  Constitutional: He appears well-nourished.  HENT:  Right Ear: Tympanic membrane and ear canal normal.  Left Ear: Tympanic membrane and ear canal normal.  Nose: No mucosal edema. Right sinus exhibits no maxillary sinus tenderness and no frontal sinus tenderness. Left sinus exhibits no maxillary sinus tenderness and no frontal sinus tenderness.  Mouth/Throat: Oropharynx is clear and moist.  Eyes: Conjunctivae are normal.  Neck: Neck supple.  Cardiovascular: Normal rate and regular rhythm.   Pulmonary/Chest: Effort normal and breath sounds normal. He has no wheezes. He has no rales.  Skin: Skin is warm  and dry.          Assessment & Plan:  URI:  Cough, congestion, fatigue, body aches x 4 days. Some improvement with OTC treatment. Exam is with clear lungs and unremarkable HENT exam. Suspect viral involvement and will treat with supportive measures. RX for Tessalon pearls sent into pharmacy for cough. Flonase, Nyquil PRN HS. Increase fluids and rest. Return precautions provided.

## 2016-02-22 NOTE — Patient Instructions (Signed)
You may take Benzonatate capsules for cough. Take 1 capsule by mouth three times daily as needed for cough.  Nasal Congestion/Sinus Pressure: Try using Flonase (fluticasone) nasal spray. Instill 2 sprays in each nostril once daily.   Please notify me if you develop persistent fevers of 101, start coughing up green mucous, notice increased fatigue or weakness, or feel worse after 1 week of onset of symptoms.   Increase consumption of water intake and rest.  It was a pleasure to see you today!  Upper Respiratory Infection, Adult Most upper respiratory infections (URIs) are a viral infection of the air passages leading to the lungs. A URI affects the nose, throat, and upper air passages. The most common type of URI is nasopharyngitis and is typically referred to as "the common cold." URIs run their course and usually go away on their own. Most of the time, a URI does not require medical attention, but sometimes a bacterial infection in the upper airways can follow a viral infection. This is called a secondary infection. Sinus and middle ear infections are common types of secondary upper respiratory infections. Bacterial pneumonia can also complicate a URI. A URI can worsen asthma and chronic obstructive pulmonary disease (COPD). Sometimes, these complications can require emergency medical care and may be life threatening.  CAUSES Almost all URIs are caused by viruses. A virus is a type of germ and can spread from one person to another.  RISKS FACTORS You may be at risk for a URI if:   You smoke.   You have chronic heart or lung disease.  You have a weakened defense (immune) system.   You are very young or very old.   You have nasal allergies or asthma.  You work in crowded or poorly ventilated areas.  You work in health care facilities or schools. SIGNS AND SYMPTOMS  Symptoms typically develop 2-3 days after you come in contact with a cold virus. Most viral URIs last 7-10 days.  However, viral URIs from the influenza virus (flu virus) can last 14-18 days and are typically more severe. Symptoms may include:   Runny or stuffy (congested) nose.   Sneezing.   Cough.   Sore throat.   Headache.   Fatigue.   Fever.   Loss of appetite.   Pain in your forehead, behind your eyes, and over your cheekbones (sinus pain).  Muscle aches.  DIAGNOSIS  Your health care provider may diagnose a URI by:  Physical exam.  Tests to check that your symptoms are not due to another condition such as:  Strep throat.  Sinusitis.  Pneumonia.  Asthma. TREATMENT  A URI goes away on its own with time. It cannot be cured with medicines, but medicines may be prescribed or recommended to relieve symptoms. Medicines may help:  Reduce your fever.  Reduce your cough.  Relieve nasal congestion. HOME CARE INSTRUCTIONS   Take medicines only as directed by your health care provider.   Gargle warm saltwater or take cough drops to comfort your throat as directed by your health care provider.  Use a warm mist humidifier or inhale steam from a shower to increase air moisture. This may make it easier to breathe.  Drink enough fluid to keep your urine clear or pale yellow.   Eat soups and other clear broths and maintain good nutrition.   Rest as needed.   Return to work when your temperature has returned to normal or as your health care provider advises. You may need to  stay home longer to avoid infecting others. You can also use a face mask and careful hand washing to prevent spread of the virus.  Increase the usage of your inhaler if you have asthma.   Do not use any tobacco products, including cigarettes, chewing tobacco, or electronic cigarettes. If you need help quitting, ask your health care provider. PREVENTION  The best way to protect yourself from getting a cold is to practice good hygiene.   Avoid oral or hand contact with people with cold symptoms.    Wash your hands often if contact occurs.  There is no clear evidence that vitamin C, vitamin E, echinacea, or exercise reduces the chance of developing a cold. However, it is always recommended to get plenty of rest, exercise, and practice good nutrition.  SEEK MEDICAL CARE IF:   You are getting worse rather than better.   Your symptoms are not controlled by medicine.   You have chills.  You have worsening shortness of breath.  You have brown or red mucus.  You have yellow or brown nasal discharge.  You have pain in your face, especially when you bend forward.  You have a fever.  You have swollen neck glands.  You have pain while swallowing.  You have white areas in the back of your throat. SEEK IMMEDIATE MEDICAL CARE IF:   You have severe or persistent:  Headache.  Ear pain.  Sinus pain.  Chest pain.  You have chronic lung disease and any of the following:  Wheezing.  Prolonged cough.  Coughing up blood.  A change in your usual mucus.  You have a stiff neck.  You have changes in your:  Vision.  Hearing.  Thinking.  Mood. MAKE SURE YOU:   Understand these instructions.  Will watch your condition.  Will get help right away if you are not doing well or get worse.   This information is not intended to replace advice given to you by your health care provider. Make sure you discuss any questions you have with your health care provider.   Document Released: 05/03/2001 Document Revised: 03/24/2015 Document Reviewed: 02/12/2014 Elsevier Interactive Patient Education [employer].

## 2016-02-22 NOTE — Telephone Encounter (Signed)
Refills already been sent on office visit on 02/22/2016.

## 2016-05-18 ENCOUNTER — Telehealth: Payer: Self-pay | Admitting: Primary Care

## 2016-05-18 DIAGNOSIS — I10 Essential (primary) hypertension: Secondary | ICD-10-CM

## 2016-05-18 DIAGNOSIS — E785 Hyperlipidemia, unspecified: Secondary | ICD-10-CM

## 2016-05-18 MED ORDER — SIMVASTATIN 20 MG PO TABS: 20.0000 mg | ORAL_TABLET | Freq: Every day | ORAL | Status: DC

## 2016-05-18 MED ORDER — LOSARTAN POTASSIUM 25 MG PO TABS: 25.0000 mg | ORAL_TABLET | Freq: Every day | ORAL | Status: DC

## 2016-05-18 NOTE — Telephone Encounter (Signed)
Received refill request from [facility] for  Losartan 25 mg and simvastatin 20 mg  Sent refill as requested.

## 2016-10-07 ENCOUNTER — Ambulatory Visit (INDEPENDENT_AMBULATORY_CARE_PROVIDER_SITE_OTHER): Payer: 59

## 2016-10-07 DIAGNOSIS — Z23 Encounter for immunization: Secondary | ICD-10-CM

## 2017-02-24 ENCOUNTER — Other Ambulatory Visit: Payer: Self-pay | Admitting: Primary Care

## 2017-02-24 DIAGNOSIS — E785 Hyperlipidemia, unspecified: Secondary | ICD-10-CM

## 2017-02-24 DIAGNOSIS — I10 Essential (primary) hypertension: Secondary | ICD-10-CM

## 2017-03-27 ENCOUNTER — Ambulatory Visit (INDEPENDENT_AMBULATORY_CARE_PROVIDER_SITE_OTHER): Payer: 59 | Admitting: Primary Care

## 2017-03-27 ENCOUNTER — Encounter: Payer: Self-pay | Admitting: Primary Care

## 2017-03-27 VITALS — BP 120/74 | HR 64 | Temp 97.8°F | Ht 69.0 in | Wt 192.1 lb

## 2017-03-27 DIAGNOSIS — E785 Hyperlipidemia, unspecified: Secondary | ICD-10-CM | POA: Diagnosis not present

## 2017-03-27 DIAGNOSIS — J069 Acute upper respiratory infection, unspecified: Secondary | ICD-10-CM | POA: Diagnosis not present

## 2017-03-27 DIAGNOSIS — I10 Essential (primary) hypertension: Secondary | ICD-10-CM

## 2017-03-27 MED ORDER — AZITHROMYCIN 250 MG PO TABS: ORAL_TABLET | ORAL | 0 refills | Status: DC

## 2017-03-27 MED ORDER — SIMVASTATIN 20 MG PO TABS: ORAL_TABLET | ORAL | 0 refills | Status: DC

## 2017-03-27 MED ORDER — LOSARTAN POTASSIUM 25 MG PO TABS: 25.0000 mg | ORAL_TABLET | Freq: Every day | ORAL | 0 refills | Status: DC

## 2017-03-27 NOTE — Progress Notes (Signed)
Pre visit review using our clinic review tool, if applicable. No additional management support is needed unless otherwise documented below in the visit note. 

## 2017-03-27 NOTE — Assessment & Plan Note (Signed)
Refilled one month of simvastatin, discussed he will need CPE for further refills. Check lipids at upcoming visit.

## 2017-03-27 NOTE — Assessment & Plan Note (Signed)
Stable in the office today. Refilled 1 month of Losartan, discussed he will need an CPE for any further refills. Okay to reschedule as he's acutely ill today.

## 2017-03-27 NOTE — Progress Notes (Signed)
Subjective:    Patient ID: Alec Wilson, male    DOB: Sep 24, 1951, 66 y.o.   MRN: [id number]  HPI  Mr. Bartnik is a 66 year old male who presents today with a chief complaint of cough and for follow up of his medical conditions. He is needing refills of his losartan and simvastatin. He would like to schedule a physical within the next month.  1) Cough: He also reports chest congestion, chills, fatigue. His cough is intermittently productive with green/white sputum. His cough has been present for the past 8 days that started while traveling on a plane coming home from [place]. He's taken Mucinex DM and Nyquil without much improvement. He has felt chills intermittently. He doesn't feel as though he's any better.   2) Essential Hypertension: Currently managed on losartan 25 mg. His BP in the office today is 120/74. He denies chest pain, shortness of breath, dizziness.  3) Hyperlipidemia: Currently managed on simvastatin 20 mg and Fish Oil. His last lipid panel was slightly above goal in June 2016, he has not been to our clinic since.   Review of Systems  Constitutional: Positive for chills and fatigue.  HENT: Positive for congestion and sinus pressure. Negative for ear pain and sore throat.   Eyes: Negative for visual disturbance.  Respiratory: Positive for cough. Negative for shortness of breath.   Cardiovascular: Negative for chest pain.  Neurological: Negative for dizziness.       Past Medical History:  Diagnosis Date  . Adenomatous colon polyp    last colonoscopy was 3 years ago.  . Anemia   . Atrial Fibrillation    Stress ECHO-Negative (09/22/98)  . Diverticulosis   . Hemorrhoids summer 2005   s/p ligation of internal hemorrhoids  . HTN (hypertension)   . Hyperlipidemia   . Inguinal hernia   . RMSF [facility] spotted fever) July 2005     Social History   Social History  . Marital status: Married    Spouse name: N/A  . Number of children: N/A  . Years of  education: N/A   Occupational History  . Not on file.   Social History Main Topics  . Smoking status: Former Smoker    Packs/day: 1.00    Types: Cigarettes  . Smokeless tobacco: Former Systems developer    Quit date: 02/19/1998  . Alcohol use 1.2 oz/week    2 Glasses of wine per week     Comment: weekly  . Drug use: No  . Sexual activity: Yes    Birth control/ protection: Surgical   Other Topics Concern  . Not on file   Social History Narrative   From [place].   Works at [employer] in [employer].   Married.   2 children: 39, 27. Daughters live in [place] and [place].   Enjoys golfing, visiting vineyards, traveling.       Past Surgical History:  Procedure Laterality Date  . cardiolite study  2004   negative  . COLONOSCOPY  2013  . [place]  2005  . THROAT SURGERY  childhood  . VASECTOMY      Family History  Problem Relation Age of Onset  . Coronary artery disease Father 49    s/p MI in earlly 70s  . Heart disease Father   . Parkinson's disease Father   . Colon polyps Father   . Hypertension Brother   . Heart disease Mother   . Hypertension Mother   . Arthritis Mother   .  Cancer Maternal Grandmother   . Cancer Maternal Grandfather   . Heart attack Paternal Grandfather   . Rectal cancer Neg Hx   . Stomach cancer Neg Hx   . Colon cancer Neg Hx   . Esophageal cancer Neg Hx     No Known Allergies  Current Outpatient Prescriptions on File Prior to Visit  Medication Sig Dispense Refill  . aspirin 81 MG tablet Take 81 mg by mouth daily.    . fish oil-omega-3 fatty acids 1000 MG capsule Take 1 g by mouth daily.    . Multiple Vitamins-Minerals (MULTIVITAMIN PO) Take 1 Centrum Silver multivitamin by mouth daily    . vitamin E 400 UNIT capsule Take 400 Units by mouth daily.     No current facility-administered medications on file prior to visit.     BP 120/74   Pulse 64   Temp 97.8 F (36.6 C) (Oral)   Ht 5\' 9"  (1.753 m)   Wt 192 lb 1.9 oz  (87.1 kg)   SpO2 97%   BMI 28.37 kg/m    Objective:   Physical Exam  Constitutional: He appears well-nourished. He appears ill.  HENT:  Right Ear: Tympanic membrane and ear canal normal.  Left Ear: Tympanic membrane and ear canal normal.  Nose: No mucosal edema. Right sinus exhibits maxillary sinus tenderness. Right sinus exhibits no frontal sinus tenderness. Left sinus exhibits maxillary sinus tenderness. Left sinus exhibits no frontal sinus tenderness.  Mouth/Throat: Oropharynx is clear and moist.  Eyes: Conjunctivae are normal.  Neck: Neck supple.  Cardiovascular: Normal rate and regular rhythm.   Pulmonary/Chest: Effort normal and breath sounds normal. He has no wheezes. He has no rales.  Skin: Skin is warm and dry.          Assessment & Plan:  URI:  Cough, chest congestion, fatigue, chills x 8 days. Traveling on a plane from [place] since symptoms began. Exam today overall stable, does appear fatigue/acutely ill. Given duration of symptoms, recent air travel, and presentation, will treat. Rx for Zpack sent to pharmacy. Discussed to continue Mucinex. Fluids, rest, follow up PRN.  Sheral Flow, NP

## 2017-03-27 NOTE — Patient Instructions (Signed)
Start Azithromycin antibiotics. Take 2 tablets by mouth today, then 1 tablet daily for 4 additional days.  Continue Mucinex DM for cough and congestion. Ensure you are staying hydrated with water.  Please schedule a physical with me within the next 1-2 months. You may also schedule a lab only appointment 3-4 days prior. We will discuss your lab results in detail during your physical.  It was a pleasure to see you today!

## 2017-04-25 ENCOUNTER — Other Ambulatory Visit: Payer: Self-pay | Admitting: Primary Care

## 2017-04-25 DIAGNOSIS — E785 Hyperlipidemia, unspecified: Secondary | ICD-10-CM

## 2017-04-25 DIAGNOSIS — I10 Essential (primary) hypertension: Secondary | ICD-10-CM

## 2017-05-05 ENCOUNTER — Other Ambulatory Visit (INDEPENDENT_AMBULATORY_CARE_PROVIDER_SITE_OTHER): Payer: 59

## 2017-05-05 DIAGNOSIS — E785 Hyperlipidemia, unspecified: Secondary | ICD-10-CM

## 2017-05-05 DIAGNOSIS — I10 Essential (primary) hypertension: Secondary | ICD-10-CM | POA: Diagnosis not present

## 2017-05-05 LAB — COMPREHENSIVE METABOLIC PANEL
ALBUMIN: 4.1 g/dL (ref 3.5–5.2)
ALT: 17 U/L (ref 0–53)
AST: 14 U/L (ref 0–37)
Alkaline Phosphatase: 91 U/L (ref 39–117)
BUN: 12 mg/dL (ref 6–23)
CHLORIDE: 104 meq/L (ref 96–112)
CO2: 29 mEq/L (ref 19–32)
Calcium: 9.4 mg/dL (ref 8.4–10.5)
Creatinine, Ser: 0.88 mg/dL (ref 0.40–1.50)
GFR: 92.73 mL/min (ref >60.00)
GLUCOSE: 91 mg/dL (ref 70–99)
POTASSIUM: 4.4 meq/L (ref 3.5–5.1)
SODIUM: 138 meq/L (ref 135–145)
Total Bilirubin: 0.7 mg/dL (ref 0.2–1.2)
Total Protein: 6.7 g/dL (ref 6.0–8.3)

## 2017-05-05 LAB — LIPID PANEL
CHOLESTEROL: 150 mg/dL (ref 0–200)
HDL: 63.5 mg/dL (ref >39.00)
LDL CALC: 63 mg/dL (ref 0–99)
NonHDL: 86.3
Total CHOL/HDL Ratio: 2
Triglycerides: 119 mg/dL (ref 0.0–149.0)
VLDL: 23.8 mg/dL (ref 0.0–40.0)

## 2017-05-10 ENCOUNTER — Ambulatory Visit (INDEPENDENT_AMBULATORY_CARE_PROVIDER_SITE_OTHER): Payer: 59 | Admitting: Primary Care

## 2017-05-10 ENCOUNTER — Encounter: Payer: Self-pay | Admitting: Primary Care

## 2017-05-10 VITALS — BP 142/90 | HR 60 | Temp 97.8°F | Ht 68.0 in | Wt 193.0 lb

## 2017-05-10 DIAGNOSIS — E785 Hyperlipidemia, unspecified: Secondary | ICD-10-CM

## 2017-05-10 DIAGNOSIS — I10 Essential (primary) hypertension: Secondary | ICD-10-CM

## 2017-05-10 DIAGNOSIS — R011 Cardiac murmur, unspecified: Secondary | ICD-10-CM

## 2017-05-10 DIAGNOSIS — Z0000 Encounter for general adult medical examination without abnormal findings: Secondary | ICD-10-CM | POA: Diagnosis not present

## 2017-05-10 DIAGNOSIS — M1990 Unspecified osteoarthritis, unspecified site: Secondary | ICD-10-CM

## 2017-05-10 MED ORDER — SIMVASTATIN 20 MG PO TABS: ORAL_TABLET | ORAL | 3 refills | Status: DC

## 2017-05-10 MED ORDER — LOSARTAN POTASSIUM 25 MG PO TABS: 25.0000 mg | ORAL_TABLET | Freq: Every day | ORAL | 3 refills | Status: DC

## 2017-05-10 NOTE — Assessment & Plan Note (Signed)
Slightly above goal this morning, typically not elevated in the office. Recently took BP meds prior to his visit.

## 2017-05-10 NOTE — Patient Instructions (Signed)
It's important to improve your diet by reducing processed snack foods. Increase consumption of fresh vegetables and fruits, whole grains, water.  Ensure you are drinking 64 ounces of water daily.  Continue exercising. You should be getting 150 minutes of moderate intensity exercise weekly.  I sent refills of your medication to your pharmacy.  Follow up in 1 year for your annual exam or sooner if needed.  It was a pleasure to see you today!

## 2017-05-10 NOTE — Assessment & Plan Note (Signed)
Recent lipids and LFT's stable. Continue simvastatin.

## 2017-05-10 NOTE — Progress Notes (Signed)
Subjective:    Patient ID: Alec Wilson, male    DOB: 1951-04-08, 66 y.o.   MRN: [id number]  HPI  Alec Wilson is a 66 year old male who presents today for complete physical.  Immunizations: -Tetanus: Completed in 2008, declines -Influenza: Completed last season -Shingles: Due, declines  Diet: He endorses a fair diet. Breakfast: Egg white, bacon wrap Lunch: Sandwich, chips, snack bar Dinner: Eggs, meat, some vegetables, starch Snacks: Granola bar, chips, pretzels Desserts: 3 days weekly Beverages: Water, coffee, wine three days weekly  Exercise: He will typically work out at [employer] 3-5 days weekly for 1 hour Eye exam: Completed over 1 year ago. Dental exam: Completes semi-annually Colonoscopy: Completed in 2016, due in 2021 PSA: Completed in 2016, negative.    Review of Systems  Constitutional: Negative for unexpected weight change.  HENT: Negative for rhinorrhea.   Respiratory: Negative for cough and shortness of breath.   Cardiovascular: Negative for chest pain.  Gastrointestinal: Negative for constipation and diarrhea.  Genitourinary: Negative for difficulty urinating.  Musculoskeletal: Positive for arthralgias. Negative for myalgias.       Left hip stiffness occasionally, usually in the morning, improves throughout the day.  Skin: Negative for rash.  Allergic/Immunologic: Negative for environmental allergies.  Neurological: Negative for dizziness, numbness and headaches.  Psychiatric/Behavioral:       Denies concerns for anxiety or depression       Past Medical History:  Diagnosis Date  . Adenomatous colon polyp    last colonoscopy was 3 years ago.  . Anemia   . Atrial Fibrillation    Stress ECHO-Negative (09/22/98)  . Diverticulosis   . Hemorrhoids summer 2005   s/p ligation of internal hemorrhoids  . HTN (hypertension)   . Hyperlipidemia   . Inguinal hernia   . RMSF [facility] spotted fever) July 2005     Social History   Social History  .  Marital status: Married    Spouse name: N/A  . Number of children: N/A  . Years of education: N/A   Occupational History  . Not on file.   Social History Main Topics  . Smoking status: Former Smoker    Packs/day: 1.00    Types: Cigarettes  . Smokeless tobacco: Former Systems developer    Quit date: 02/19/1998  . Alcohol use 1.2 oz/week    2 Glasses of wine per week     Comment: weekly  . Drug use: No  . Sexual activity: Yes    Birth control/ protection: Surgical   Other Topics Concern  . Not on file   Social History Narrative   From [place].   Works at [employer] in [employer].   Married.   2 children: 39, 27. Daughters live in [place] and [place].   Enjoys golfing, visiting vineyards, traveling.       Past Surgical History:  Procedure Laterality Date  . cardiolite study  2004   negative  . COLONOSCOPY  2013  . [place]  2005  . THROAT SURGERY  childhood  . VASECTOMY      Family History  Problem Relation Age of Onset  . Coronary artery disease Father 43       s/p MI in earlly 70s  . Heart disease Father   . Parkinson's disease Father   . Colon polyps Father   . Hypertension Brother   . Heart disease Mother   . Hypertension Mother   . Arthritis Mother   . Cancer  Maternal Grandmother   . Cancer Maternal Grandfather   . Heart attack Paternal Grandfather   . Rectal cancer Neg Hx   . Stomach cancer Neg Hx   . Colon cancer Neg Hx   . Esophageal cancer Neg Hx     No Known Allergies  Current Outpatient Prescriptions on File Prior to Visit  Medication Sig Dispense Refill  . aspirin 81 MG tablet Take 81 mg by mouth daily.    . fish oil-omega-3 fatty acids 1000 MG capsule Take 1 g by mouth daily.    . Multiple Vitamins-Minerals (MULTIVITAMIN PO) Take 1 Centrum Silver multivitamin by mouth daily    . vitamin E 400 UNIT capsule Take 400 Units by mouth daily.     No current facility-administered medications on file prior to visit.     BP (!)  142/90 (BP Location: Right Arm, Patient Position: Sitting, Cuff Size: Normal)   Pulse 60   Temp 97.8 F (36.6 C) (Oral)   Ht 5\' 8"  (1.727 m)   Wt 193 lb (87.5 kg)   SpO2 97%   BMI 29.35 kg/m    Objective:   Physical Exam  Constitutional: He is oriented to person, place, and time. He appears well-nourished.  HENT:  Right Ear: Tympanic membrane and ear canal normal.  Left Ear: Tympanic membrane and ear canal normal.  Nose: Nose normal. Right sinus exhibits no maxillary sinus tenderness and no frontal sinus tenderness. Left sinus exhibits no maxillary sinus tenderness and no frontal sinus tenderness.  Mouth/Throat: Oropharynx is clear and moist.  Eyes: Conjunctivae and EOM are normal. Pupils are equal, round, and reactive to light.  Neck: Neck supple. Carotid bruit is not present. No thyromegaly present.  Cardiovascular: Normal rate, regular rhythm and normal heart sounds.   Pulmonary/Chest: Effort normal and breath sounds normal. He has no wheezes. He has no rales.  Abdominal: Soft. Bowel sounds are normal. There is no tenderness.  Musculoskeletal: Normal range of motion.  Neurological: He is alert and oriented to person, place, and time. He has normal reflexes. No cranial nerve deficit.  Skin: Skin is warm and dry.  Psychiatric: He has a normal mood and affect.          Assessment & Plan:

## 2017-05-10 NOTE — Assessment & Plan Note (Signed)
Intermittent left hip pain with stiffness, likely arthritis. Discussed regular exercise and weight loss, stretching, NSAID's PRN.

## 2017-05-10 NOTE — Assessment & Plan Note (Signed)
Immunizations UTD. PSA UTD. Colonoscopy UTD, due in 2021. Discussed the importance of a healthy diet and regular exercise in order for weight loss, and to reduce the risk of other medical problems. Exam unremarkable. Labs unremarkable. Follow up in 1 year.

## 2017-05-10 NOTE — Assessment & Plan Note (Signed)
Regular rate and rhythm today. Continue aspirin 81 mg.

## 2017-05-20 ENCOUNTER — Other Ambulatory Visit: Payer: Self-pay | Admitting: Primary Care

## 2017-05-20 DIAGNOSIS — E785 Hyperlipidemia, unspecified: Secondary | ICD-10-CM

## 2017-05-20 DIAGNOSIS — I10 Essential (primary) hypertension: Secondary | ICD-10-CM

## 2017-08-30 ENCOUNTER — Ambulatory Visit (INDEPENDENT_AMBULATORY_CARE_PROVIDER_SITE_OTHER): Payer: 59 | Admitting: Primary Care

## 2017-08-30 ENCOUNTER — Encounter: Payer: Self-pay | Admitting: Primary Care

## 2017-08-30 VITALS — BP 120/72 | HR 55 | Temp 97.5°F | Wt 195.0 lb

## 2017-08-30 DIAGNOSIS — G5602 Carpal tunnel syndrome, left upper limb: Secondary | ICD-10-CM | POA: Diagnosis not present

## 2017-08-30 NOTE — Patient Instructions (Signed)
Purchase a cock up wrist splint from any drug store. Wear this when typing and/or when sleeping.  Start Ibuprofen 600 mg three times daily for the next 3-4 days to help with inflammation/swelling, pain, and tingling of the wrist.  Please notify me if no improvement in 1 week.  It was a pleasure to see you today!   Carpal Tunnel Syndrome Carpal tunnel syndrome is a condition that causes pain in your hand and arm. The carpal tunnel is a narrow area located on the palm side of your wrist. Repeated wrist motion or certain diseases may cause swelling within the tunnel. This swelling pinches the main nerve in the wrist (median nerve). What are the causes? This condition may be caused by:  Repeated wrist motions.  Wrist injuries.  Arthritis.  A cyst or tumor in the carpal tunnel.  Fluid buildup during pregnancy.  Sometimes the cause of this condition is not known. What increases the risk? This condition is more likely to develop in:  People who have jobs that cause them to repeatedly move their wrists in the same motion, such as Art gallery manager.  Women.  People with certain conditions, such as: ? Diabetes. ? Obesity. ? An underactive thyroid (hypothyroidism). ? Kidney failure.  What are the signs or symptoms? Symptoms of this condition include:  A tingling feeling in your fingers, especially in your thumb, index, and middle fingers.  Tingling or numbness in your hand.  An aching feeling in your entire arm, especially when your wrist and elbow are bent for long periods of time.  Wrist pain that goes up your arm to your shoulder.  Pain that goes down into your palm or fingers.  A weak feeling in your hands. You may have trouble grabbing and holding items.  Your symptoms may feel worse during the night. How is this diagnosed? This condition is diagnosed with a medical history and physical exam. You may also have tests, including:  An electromyogram (EMG). This  test measures electrical signals sent by your nerves into the muscles.  X-rays.  How is this treated? Treatment for this condition includes:  Lifestyle changes. It is important to stop doing or modify the activity that caused your condition.  Physical or occupational therapy.  Medicines for pain and inflammation. This may include medicine that is injected into your wrist.  A wrist splint.  Surgery.  Follow these instructions at home: If you have a splint:  Wear it as told by your health care provider. Remove it only as told by your health care provider.  Loosen the splint if your fingers become numb and tingle, or if they turn cold and blue.  Keep the splint clean and dry. General instructions  Take over-the-counter and prescription medicines only as told by your health care provider.  Rest your wrist from any activity that may be causing your pain. If your condition is work related, talk to your employer about changes that can be made, such as getting a wrist pad to use while typing.  If directed, apply ice to the painful area: ? Put ice in a plastic bag. ? Place a towel between your skin and the bag. ? Leave the ice on for 20 minutes, 2-3 times per day.  Keep all follow-up visits as told by your health care provider. This is important.  Do any exercises as told by your health care provider, physical therapist, or occupational therapist. Contact a health care provider if:  You have new symptoms.  Your pain is not controlled with medicines.  Your symptoms get worse. This information is not intended to replace advice given to you by your health care provider. Make sure you discuss any questions you have with your health care provider. Document Released: 11/04/2000 Document Revised: 03/17/2016 Document Reviewed: 03/25/2015 Elsevier Interactive Patient Education  2017 [employer].

## 2017-08-30 NOTE — Progress Notes (Signed)
Subjective:    Patient ID: Alec Wilson, male    DOB: [birth year], 66 y.o.   MRN: [id number]  HPI  Mr. Stcharles is a 66 year old male with a history of osteoarthritis who presents today with a chief complaint of wrist swelling. His swelling is located to the left wrist that he first noticed Monday afternoon at work with decreased ability to grip. Yesterday he woke up and noticed significant swelling that wasn't there the prior day. He's also noticed intermittent numbness/tingling to his finger tips that will radiate up to the wrist and also left forearm.   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 No prior history of carpal tunnel, unilateral weakness, changes in speech, facial dropping, weakness, recent injury/trauma. He's been taking Ibuprofen and placing ice on the wrist with some improvement.  Review of Systems  Musculoskeletal:       Left hand and wrist swelling, numbness/tingling, decrease in ROM.  Skin: Negative for color change.  Neurological: Positive for numbness. Negative for weakness.       Past Medical History:  Diagnosis Date  . Adenomatous colon polyp    last colonoscopy was 3 years ago.  . Anemia   . Atrial Fibrillation    Stress ECHO-Negative (09/22/98)  . Diverticulosis   . Hemorrhoids summer 2005   s/p ligation of internal hemorrhoids  . HTN (hypertension)   . Hyperlipidemia   . Inguinal hernia   . [facility] spotted fever) July 2005     Social History   Social History  . Marital status: Married    Spouse name: N/A  . Number of children: N/A  . Years of education: N/A   Occupational History  . Not on file.   Social History Main Topics  . Smoking status: Former Smoker    Packs/day: 1.00    Types: Cigarettes  . Smokeless tobacco: Former Systems developer    Quit date: 02/19/1998  . Alcohol use 1.2 oz/week    2 Glasses of wine per week     Comment: weekly  . Drug use: No  . Sexual activity: Yes    Birth  control/ protection: Surgical   Other Topics Concern  . Not on file   Social History Narrative   From [place].   Works at [employer] in [employer].   Married.   2 children: 39, 27. Daughters live in [place] and [place].   Enjoys golfing, visiting vineyards, traveling.       Past Surgical History:  Procedure Laterality Date  . cardiolite study  2004   negative  . COLONOSCOPY  2013  . [place]  2005  . THROAT SURGERY  childhood  . VASECTOMY      Family History  Problem Relation Age of Onset  . Coronary artery disease Father 23       s/p MI in earlly 70s  . Heart disease Father   . Parkinson's disease Father   . Colon polyps Father   . Hypertension Brother   . Heart disease Mother   . Hypertension Mother   . Arthritis Mother   . Cancer Maternal Grandmother   . Cancer Maternal Grandfather   . Heart attack Paternal Grandfather   . Rectal cancer Neg Hx   . Stomach cancer Neg Hx   . Colon cancer Neg Hx   . Esophageal cancer Neg Hx     No Known Allergies  Current  Outpatient Prescriptions on File Prior to Visit  Medication Sig Dispense Refill  . aspirin 81 MG tablet Take 81 mg by mouth daily.    . fish oil-omega-3 fatty acids 1000 MG capsule Take 1 g by mouth daily.    Marland Kitchen losartan (COZAAR) 25 MG tablet Take 1 tablet (25 mg total) by mouth daily. 90 tablet 3  . Multiple Vitamins-Minerals (MULTIVITAMIN PO) Take 1 Centrum Silver multivitamin by mouth daily    . simvastatin (ZOCOR) 20 MG tablet Take 1 tablet by mouth every evening. 90 tablet 3  . vitamin E 400 UNIT capsule Take 400 Units by mouth daily.     No current facility-administered medications on file prior to visit.     BP 120/72   Pulse (!) 55   Temp (!) 97.5 F (36.4 C) (Oral)   Wt 195 lb (88.5 kg)   SpO2 98%   BMI 29.65 kg/m    Objective:   Physical Exam  Constitutional: He appears well-nourished.  Cardiovascular: Normal rate.   Pulmonary/Chest: Effort normal.    Musculoskeletal:       Left wrist: He exhibits decreased range of motion and swelling. He exhibits no tenderness and no deformity.  Mild-moderate swelling to left hand up to wrist. Decrease in ROM to left wrist with extension.  Neurological:  Positive Phalen's sign.    Skin: Skin is warm and dry. No erythema.          Assessment & Plan:  Carpal Tunnel:  Symptoms today highly suspicious for carpal tunnel. No suspicion for CVA, fracture/sprain, circulatory problem. Will treat with cock up splinting and NSAID's. Discussed cause of carpal tunnel and answered all questions. He will update if no improvement in 1 week.  Sheral Flow, NP

## 2017-11-21 HISTORY — PX: INGUINAL HERNIA REPAIR: SUR1180

## 2018-02-08 ENCOUNTER — Ambulatory Visit (INDEPENDENT_AMBULATORY_CARE_PROVIDER_SITE_OTHER): Payer: 59 | Admitting: Primary Care

## 2018-02-08 ENCOUNTER — Encounter: Payer: Self-pay | Admitting: Primary Care

## 2018-02-08 VITALS — BP 124/74 | HR 57 | Temp 98.0°F | Ht 68.0 in | Wt 186.5 lb

## 2018-02-08 DIAGNOSIS — K4091 Unilateral inguinal hernia, without obstruction or gangrene, recurrent: Secondary | ICD-10-CM

## 2018-02-08 NOTE — Progress Notes (Signed)
Subjective:    Patient ID: Alec Wilson, male    DOB: May 09, 1951, 67 y.o.   MRN: [id number]  HPI  Alec Wilson is a 67 year old male with a history of diverticulitis, right inguinal hernia, colonic polyps, internal hemorrhoids who presents today with a chief complaint of abdominal pain.   Several weeks ago he sneezed hard and felt a tearing/ripping pain to his bilateral mid lower and upper abdomen. Since then his pain has been intermittent to the bilateral  lower and mid abdomen. Over the last several weeks he's noticed waking in the morning with increased swelling to the right groin.  His pain is present mostly when standing and sitting, improved when laying down.   He denies diarrhea, fevers, constipation, bloody stools, increased abdominal pain. He saw a surgeon years ago who notified him that he'd likely need surgical repair in the future.   Review of Systems  Constitutional: Negative for fever.  Respiratory: Negative for shortness of breath.   Cardiovascular: Negative for chest pain.  Gastrointestinal: Positive for abdominal pain. Negative for blood in stool, diarrhea, nausea and vomiting.  Genitourinary:       Right groin swelling       Past Medical History:  Diagnosis Date  . Adenomatous colon polyp    last colonoscopy was 3 years ago.  . Anemia   . Atrial Fibrillation    Stress ECHO-Negative (09/22/98)  . Diverticulosis   . Hemorrhoids summer 2005   s/p ligation of internal hemorrhoids  . HTN (hypertension)   . Hyperlipidemia   . Inguinal hernia   . RMSF [facility] spotted fever) July 2005     Social History   Socioeconomic History  . Marital status: Married    Spouse name: Not on file  . Number of children: Not on file  . Years of education: Not on file  . Highest education level: Not on file  Occupational History  . Not on file  Social Needs  . Financial resource strain: Not on file  . Food insecurity:    Worry: Not on file    Inability: Not on file    . Transportation needs:    Medical: Not on file    Non-medical: Not on file  Tobacco Use  . Smoking status: Former Smoker    Packs/day: 1.00    Types: Cigarettes  . Smokeless tobacco: Former Systems developer    Quit date: 02/19/1998  Substance and Sexual Activity  . Alcohol use: Yes    Alcohol/week: 1.2 oz    Types: 2 Glasses of wine per week    Comment: weekly  . Drug use: No  . Sexual activity: Yes    Birth control/protection: Surgical  Lifestyle  . Physical activity:    Days per week: Not on file    Minutes per session: Not on file  . Stress: Not on file  Relationships  . Social connections:    Talks on phone: Not on file    Gets together: Not on file    Attends religious service: Not on file    Active member of club or organization: Not on file    Attends meetings of clubs or organizations: Not on file    Relationship status: Not on file  . Intimate partner violence:    Fear of current or ex partner: Not on file    Emotionally abused: Not on file    Physically abused: Not on file    Forced sexual activity: Not on file  Other Topics Concern  . Not on file  Social History Narrative   From [place].   Works at [employer] in [employer].   Married.   2 children: 39, 27. Daughters live in [place] and [place].   Enjoys golfing, visiting vineyards, traveling.    Past Surgical History:  Procedure Laterality Date  . cardiolite study  2004   negative  . COLONOSCOPY  2013  . [place]  2005  . THROAT SURGERY  childhood  . VASECTOMY      Family History  Problem Relation Age of Onset  . Coronary artery disease Father 79       s/p MI in earlly 70s  . Heart disease Father   . Parkinson's disease Father   . Colon polyps Father   . Hypertension Brother   . Heart disease Mother   . Hypertension Mother   . Arthritis Mother   . Cancer Maternal Grandmother   . Cancer Maternal Grandfather   . Heart attack Paternal Grandfather   . Rectal cancer Neg Hx   .  Stomach cancer Neg Hx   . Colon cancer Neg Hx   . Esophageal cancer Neg Hx     No Known Allergies  Current Outpatient Medications on File Prior to Visit  Medication Sig Dispense Refill  . aspirin 81 MG tablet Take 81 mg by mouth daily.    . fish oil-omega-3 fatty acids 1000 MG capsule Take 1 g by mouth daily.    Marland Kitchen losartan (COZAAR) 25 MG tablet Take 1 tablet (25 mg total) by mouth daily. 90 tablet 3  . Multiple Vitamins-Minerals (MULTIVITAMIN PO) Take 1 Centrum Silver multivitamin by mouth daily    . simvastatin (ZOCOR) 20 MG tablet Take 1 tablet by mouth every evening. 90 tablet 3  . vitamin E 400 UNIT capsule Take 400 Units by mouth daily.     No current facility-administered medications on file prior to visit.     BP 124/74   Pulse (!) 57   Temp 98 F (36.7 C) (Oral)   Ht 5\' 8"  (1.727 m)   Wt 186 lb 8 oz (84.6 kg)   SpO2 99%   BMI 28.36 kg/m    Objective:   Physical Exam  Constitutional: He is oriented to person, place, and time. He appears well-nourished.  Neck: Neck supple.  Cardiovascular: Normal rate.  Pulmonary/Chest: Effort normal. He has no wheezes. He has no rales.  Abdominal: A hernia is present. Hernia confirmed positive in the right inguinal area.  Genitourinary:  Genitourinary Comments: Obvious moderate swelling to right groin. Non tender.  Neurological: He is alert and oriented to person, place, and time.  Skin: Skin is warm and dry.  Psychiatric: He has a normal mood and affect.          Assessment & Plan:

## 2018-02-08 NOTE — Patient Instructions (Signed)
Stop by the front desk and speak with either Rosaria Ferries or Helyn App regarding your referral to general surgery.  Please go to the hospital if you develop severe pain and fevers.  It was a pleasure to see you today!

## 2018-02-08 NOTE — Assessment & Plan Note (Signed)
Obvious on visual inspection, also positive on direct test.  No evidence of strangulation.  Referral placed to general surgery for further evaluation.

## 2018-04-01 ENCOUNTER — Other Ambulatory Visit: Payer: Self-pay | Admitting: Primary Care

## 2018-04-01 DIAGNOSIS — I10 Essential (primary) hypertension: Secondary | ICD-10-CM

## 2018-04-01 DIAGNOSIS — E785 Hyperlipidemia, unspecified: Secondary | ICD-10-CM

## 2018-06-12 ENCOUNTER — Other Ambulatory Visit: Payer: Self-pay | Admitting: Primary Care

## 2018-06-12 DIAGNOSIS — E785 Hyperlipidemia, unspecified: Secondary | ICD-10-CM

## 2018-06-12 DIAGNOSIS — I10 Essential (primary) hypertension: Secondary | ICD-10-CM

## 2018-08-07 ENCOUNTER — Other Ambulatory Visit: Payer: Self-pay | Admitting: Primary Care

## 2018-08-07 DIAGNOSIS — I10 Essential (primary) hypertension: Secondary | ICD-10-CM

## 2018-08-07 DIAGNOSIS — E785 Hyperlipidemia, unspecified: Secondary | ICD-10-CM

## 2018-08-23 ENCOUNTER — Ambulatory Visit (INDEPENDENT_AMBULATORY_CARE_PROVIDER_SITE_OTHER): Payer: PPO

## 2018-08-23 DIAGNOSIS — Z23 Encounter for immunization: Secondary | ICD-10-CM

## 2018-09-17 ENCOUNTER — Other Ambulatory Visit: Payer: Self-pay | Admitting: Primary Care

## 2018-09-17 DIAGNOSIS — I10 Essential (primary) hypertension: Secondary | ICD-10-CM

## 2018-09-17 DIAGNOSIS — Z125 Encounter for screening for malignant neoplasm of prostate: Secondary | ICD-10-CM

## 2018-09-17 DIAGNOSIS — E785 Hyperlipidemia, unspecified: Secondary | ICD-10-CM

## 2018-09-25 ENCOUNTER — Other Ambulatory Visit (INDEPENDENT_AMBULATORY_CARE_PROVIDER_SITE_OTHER): Payer: PPO

## 2018-09-25 DIAGNOSIS — I10 Essential (primary) hypertension: Secondary | ICD-10-CM | POA: Diagnosis not present

## 2018-09-25 DIAGNOSIS — Z125 Encounter for screening for malignant neoplasm of prostate: Secondary | ICD-10-CM

## 2018-09-25 DIAGNOSIS — E785 Hyperlipidemia, unspecified: Secondary | ICD-10-CM | POA: Diagnosis not present

## 2018-09-25 LAB — LIPID PANEL
CHOL/HDL RATIO: 2
Cholesterol: 177 mg/dL (ref 0–200)
HDL: 77.5 mg/dL (ref >39.00)
LDL Cholesterol: 82 mg/dL (ref 0–99)
NONHDL: 99.86
TRIGLYCERIDES: 87 mg/dL (ref 0.0–149.0)
VLDL: 17.4 mg/dL (ref 0.0–40.0)

## 2018-09-25 LAB — COMPREHENSIVE METABOLIC PANEL
ALT: 16 U/L (ref 0–53)
AST: 15 U/L (ref 0–37)
Albumin: 4.6 g/dL (ref 3.5–5.2)
Alkaline Phosphatase: 95 U/L (ref 39–117)
BUN: 21 mg/dL (ref 6–23)
CALCIUM: 9.9 mg/dL (ref 8.4–10.5)
CHLORIDE: 100 meq/L (ref 96–112)
CO2: 30 mEq/L (ref 19–32)
Creatinine, Ser: 1.04 mg/dL (ref 0.40–1.50)
GFR: 76.14 mL/min (ref >60.00)
GLUCOSE: 91 mg/dL (ref 70–99)
POTASSIUM: 5.1 meq/L (ref 3.5–5.1)
Sodium: 137 mEq/L (ref 135–145)
Total Bilirubin: 0.8 mg/dL (ref 0.2–1.2)
Total Protein: 7.2 g/dL (ref 6.0–8.3)

## 2018-09-25 LAB — PSA: PSA: 1.57 ng/mL (ref 0.10–4.00)

## 2018-10-01 ENCOUNTER — Ambulatory Visit (INDEPENDENT_AMBULATORY_CARE_PROVIDER_SITE_OTHER): Payer: PPO | Admitting: Primary Care

## 2018-10-01 ENCOUNTER — Encounter: Payer: Self-pay | Admitting: Primary Care

## 2018-10-01 VITALS — BP 116/70 | HR 64 | Temp 98.0°F | Ht 68.0 in | Wt 175.5 lb

## 2018-10-01 DIAGNOSIS — R011 Cardiac murmur, unspecified: Secondary | ICD-10-CM | POA: Diagnosis not present

## 2018-10-01 DIAGNOSIS — Z860100 Personal history of colon polyps, unspecified: Secondary | ICD-10-CM

## 2018-10-01 DIAGNOSIS — E785 Hyperlipidemia, unspecified: Secondary | ICD-10-CM | POA: Diagnosis not present

## 2018-10-01 DIAGNOSIS — K4090 Unilateral inguinal hernia, without obstruction or gangrene, not specified as recurrent: Secondary | ICD-10-CM

## 2018-10-01 DIAGNOSIS — R21 Rash and other nonspecific skin eruption: Secondary | ICD-10-CM

## 2018-10-01 DIAGNOSIS — Z0000 Encounter for general adult medical examination without abnormal findings: Secondary | ICD-10-CM

## 2018-10-01 DIAGNOSIS — Z23 Encounter for immunization: Secondary | ICD-10-CM

## 2018-10-01 DIAGNOSIS — I10 Essential (primary) hypertension: Secondary | ICD-10-CM | POA: Diagnosis not present

## 2018-10-01 DIAGNOSIS — M1990 Unspecified osteoarthritis, unspecified site: Secondary | ICD-10-CM | POA: Diagnosis not present

## 2018-10-01 HISTORY — DX: Unilateral inguinal hernia, without obstruction or gangrene, not specified as recurrent: K40.90

## 2018-10-01 HISTORY — DX: Rash and other nonspecific skin eruption: R21

## 2018-10-01 MED ORDER — TRIAMCINOLONE ACETONIDE 0.1 % EX CREA: 1.0000 "application " | TOPICAL_CREAM | Freq: Two times a day (BID) | CUTANEOUS | 0 refills | Status: DC | PRN

## 2018-10-01 MED ORDER — SIMVASTATIN 20 MG PO TABS: ORAL_TABLET | ORAL | 3 refills | Status: DC

## 2018-10-01 MED ORDER — ZOSTER VAC RECOMB ADJUVANTED 50 MCG/0.5ML IM SUSR: 0.5000 mL | Freq: Once | INTRAMUSCULAR | 1 refills | Status: AC

## 2018-10-01 MED ORDER — LOSARTAN POTASSIUM 25 MG PO TABS: 25.0000 mg | ORAL_TABLET | Freq: Every day | ORAL | 3 refills | Status: DC

## 2018-10-01 NOTE — Assessment & Plan Note (Signed)
Stable in the office today, continue losartan 25 mg. BMP unremarkable.

## 2018-10-01 NOTE — Assessment & Plan Note (Signed)
Repeat lipids unremarkable. Continue simvastatin.

## 2018-10-01 NOTE — Patient Instructions (Addendum)
Take the shingles vaccination to your pharmacy.  Continue exercising. You should be getting 150 minutes of moderate intensity exercise weekly.  Continue to work on a healthy diet.   You will be contacted regarding your referral to Dr. Marlou Starks.  Please let us know if you have not been contacted within one week.   Have the ladies schedule an appointment with Dr. Paulla Fore or Dr. Tamala Julian for sports medicine for your hip pain.   We will see you in one year for your annual exam or sooner if needed. It was a pleasure to see you today!

## 2018-10-01 NOTE — Assessment & Plan Note (Addendum)
Rate and rhythm regular today. ECG today with NSR, rate of 65. No PAC/PVC, ST elevation. Appears similar to prior ECG in 2015.

## 2018-10-01 NOTE — Assessment & Plan Note (Signed)
Repaired earlier this year per Dr. Marlou Starks.

## 2018-10-01 NOTE — Assessment & Plan Note (Signed)
Repeat colonoscopy due in 2021.

## 2018-10-01 NOTE — Assessment & Plan Note (Signed)
Good ROM to bilateral hips. 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 Discussed different options for treatment including physical therapy, xrays, sports medicine evaluation. He will schedule an appointment with sports medicine.

## 2018-10-01 NOTE — Assessment & Plan Note (Signed)
Suspect development of left inguinal hernia. Referral placed to general surgery.

## 2018-10-01 NOTE — Progress Notes (Signed)
Patient ID: Alec Wilson, male   DOB: 1951/05/12, 67 y.o.   MRN: [id number]  Alec Wilson is a 67 year old male who presents today for Welcome to Medicare Visit.  HPI:  Past Medical History:  Diagnosis Date  . Adenomatous colon polyp    last colonoscopy was 3 years ago.  . Anemia   . Atrial Fibrillation    Stress ECHO-Negative (09/22/98)  . Diverticulosis   . Hemorrhoids summer 2005   s/p ligation of internal hemorrhoids  . HTN (hypertension)   . Hyperlipidemia   . Inguinal hernia   . RMSF [facility] spotted fever) July 2005    Current Outpatient Medications  Medication Sig Dispense Refill  . aspirin 81 MG tablet Take 81 mg by mouth daily.    . fish oil-omega-3 fatty acids 1000 MG capsule Take 1 g by mouth daily.    Marland Kitchen losartan (COZAAR) 25 MG tablet Take 1 tablet (25 mg total) by mouth daily. NEED APPOINTMENT FOR ANY MORE REFILLS 90 tablet 0  . Multiple Vitamins-Minerals (MULTIVITAMIN PO) Take 1 Centrum Silver multivitamin by mouth daily    . simvastatin (ZOCOR) 20 MG tablet TAKE 1 TABLET BY MOUTH  EVERY EVENING. NEED APPOINTMENT FOR ANY MORE REFILLS 90 tablet 0  . triamcinolone cream (KENALOG) 0.1 % Apply 1 application topically 2 (two) times daily as needed.    . vitamin E 400 UNIT capsule Take 400 Units by mouth daily.     No current facility-administered medications for this visit.     No Known Allergies  Family History  Problem Relation Age of Onset  . Coronary artery disease Father 68       s/p MI in earlly 70s  . Heart disease Father   . Parkinson's disease Father   . Colon polyps Father   . Hypertension Brother   . Heart disease Mother   . Hypertension Mother   . Arthritis Mother   . Cancer Maternal Grandmother   . Cancer Maternal Grandfather   . Heart attack Paternal Grandfather   . Rectal cancer Neg Hx   . Stomach cancer Neg Hx   . Colon cancer Neg Hx   . Esophageal cancer Neg Hx     Social History   Socioeconomic History  . Marital status: Married     Spouse name: Not on file  . Number of children: Not on file  . Years of education: Not on file  . Highest education level: Not on file  Occupational History  . Not on file  Social Needs  . Financial resource strain: Not on file  . Food insecurity:    Worry: Not on file    Inability: Not on file  . Transportation needs:    Medical: Not on file    Non-medical: Not on file  Tobacco Use  . Smoking status: Former Smoker    Packs/day: 1.00    Types: Cigarettes  . Smokeless tobacco: Former Systems developer    Quit date: 02/19/1998  Substance and Sexual Activity  . Alcohol use: Yes    Alcohol/week: 2.0 standard drinks    Types: 2 Glasses of wine per week    Comment: weekly  . Drug use: No  . Sexual activity: Yes    Birth control/protection: Surgical  Lifestyle  . Physical activity:    Days per week: Not on file    Minutes per session: Not on file  . Stress: Not on file  Relationships  . Social connections:    Talks  on phone: Not on file    Gets together: Not on file    Attends religious service: Not on file    Active member of club or organization: Not on file    Attends meetings of clubs or organizations: Not on file    Relationship status: Not on file  . Intimate partner violence:    Fear of current or ex partner: Not on file    Emotionally abused: Not on file    Physically abused: Not on file    Forced sexual activity: Not on file  Other Topics Concern  . Not on file  Social History Narrative   From [place].   Works at [employer] in [employer].   Married.   2 children: 39, 27. Daughters live in [place] and [place].   Enjoys golfing, visiting vineyards, traveling.    Hospitiliaztions: None.  Health Maintenance:    Flu: Completed this season  Tetanus: Completed in 2008  Pneumovax: Never completed  Prevnar: Due today  Zostavax: Never completed   Colonoscopy: Completed in 2016, due in 2021  Eye Doctor: Completed in 2019  Dental Exam: Completes  semi-annually   PSA: Negative in 2019  Hep C Screening: Due    Providers: Alma Friendly, PCP; Dr. Autumn Messing, [facility]   I have personally reviewed and have noted: 1. The patient's medical and social history 2. Their use of alcohol, tobacco or illicit drugs 3. Their current medications and supplements 4. The patient's functional ability including ADL's, fall risks, home safety risks  and hearing or visual impairment. 5. Diet and physical activities 6. Evidence for depression or mood disorder  Subjective:   Review of Systems:   Constitutional: Denies fever, malaise, fatigue, headache or abrupt weight changes.  HEENT: Denies eye pain, eye redness, ear pain, wax buildup, runny nose, nasal congestion, bloody nose, or sore throat. He has noticed ringing to bilateral ears, some decreased hearing in large crowds and in restaurants. Respiratory: Denies difficulty breathing, shortness of breath, cough or sputum production.   Cardiovascular: Denies chest pain, chest tightness, palpitations or swelling in the hands or feet.  Gastrointestinal: Denies abdominal pain, bloating, constipation, diarrhea or blood in the stool.  GU: Denies urgency, frequency, pain with urination, burning sensation, blood in urine, odor or discharge. Believes he's experiencing a left sided hernia, experiencing swelling to left groin.  Musculoskeletal: Bilateral hip pain and burning, mild hamstring pain. This occurs after long periods of activity, sometimes radiation to anterior bilateral lower extremity.  decreased strength going upstairs.  Skin: Denies redness, rashes, lesions or ulcercations.  Neurological: Denies dizziness, difficulty with memory, difficulty with speech or problems with balance and coordination.  Psychiatric: Denies concerns for anxiety or depression.   No other specific complaints in a complete review of systems (except as listed in HPI above).  Objective:  PE:   BP 116/70   Pulse 64    Temp 98 F (36.7 C) (Oral)   Ht 5\' 8"  (1.727 m)   Wt 175 lb 8 oz (79.6 kg)   SpO2 98%   BMI 26.68 kg/m  Wt Readings from Last 3 Encounters:  10/01/18 175 lb 8 oz (79.6 kg)  02/08/18 186 lb 8 oz (84.6 kg)  08/30/17 195 lb (88.5 kg)    General: Appears their stated age, well developed, well nourished in NAD. Skin: Warm, dry and intact. No rashes, lesions or ulcerations noted. HEENT: Head: normal shape and size; Eyes: sclera white, no icterus, conjunctiva pink, PERRLA  and EOMs intact; Ears: Tm's gray and intact, normal light reflex; Nose: mucosa pink and moist, septum midline; Throat/Mouth: Teeth present, mucosa pink and moist, no exudate, lesions or ulcerations noted.  Neck: Normal range of motion. Neck supple, trachea midline. No massses, lumps or thyromegaly present.  Cardiovascular: Normal rate and rhythm. S1,S2 noted.  No murmur, rubs or gallops noted. No JVD or BLE edema. No carotid bruits noted. Pulmonary/Chest: Normal effort and positive vesicular breath sounds. No respiratory distress. No wheezes, rales or ronchi noted.  Abdomen: Soft and nontender. Normal bowel sounds, no bruits noted. No distention or masses noted. Liver, spleen and kidneys non palpable. Swelling noted to left groin over area of inguinal canal. No obvious hernia noted on palpation through testicular/hernia exam.  Musculoskeletal: Normal range of motion. No signs of joint swelling. No difficulty with gait.  Neurological: Alert and oriented. Cranial nerves II-XII intact. Coordination normal. +DTRs bilaterally. Psychiatric: Mood and affect normal. Behavior is normal. Judgment and thought content normal.   EKG: NSR, rate of 65, no T-waver inversion except for V1 and AVR. No ST-elevation. Appears very similar to ECG in 2015.  BMET    Component Value Date/Time   NA 137 09/25/2018 0806   K 5.1 09/25/2018 0806   CL 100 09/25/2018 0806   CO2 30 09/25/2018 0806   GLUCOSE 91 09/25/2018 0806   BUN 21 09/25/2018 0806    CREATININE 1.04 09/25/2018 0806   CREATININE 0.87 04/29/2014 1316   CALCIUM 9.9 09/25/2018 0806    Lipid Panel     Component Value Date/Time   CHOL 177 09/25/2018 0806   TRIG 87.0 09/25/2018 0806   HDL 77.50 09/25/2018 0806   CHOLHDL 2 09/25/2018 0806   VLDL 17.4 09/25/2018 0806   LDLCALC 82 09/25/2018 0806    CBC    Component Value Date/Time   WBC 6.1 05/11/2015 0915   RBC 4.68 05/11/2015 0915   HGB 15.1 05/11/2015 0915   HCT 44.1 05/11/2015 0915   PLT 249.0 05/11/2015 0915   MCV 94.2 05/11/2015 0915   MCH 32.0 04/29/2014 1316   MCHC 34.3 05/11/2015 0915   RDW 13.6 05/11/2015 0915   LYMPHSABS 1.3 01/22/2013 1425   MONOABS 0.4 01/22/2013 1425   EOSABS 0.4 01/22/2013 1425   BASOSABS 0.0 01/22/2013 1425    Hgb A1C Lab Results  Component Value Date   HGBA1C 5.4 05/11/2015      Assessment and Plan:   Medicare Annual Wellness Visit:  Diet: He endorses a healthy diet Breakfast: Egg white, boiled egg, toast with cheese, fruit Lunch: Yogurt, sandwich  Dinner: Soup, meat, vegetables, starch Snacks: Graham crackers Desserts: Chocolate nightly Beverages: Coffee, water, red wine Physical activity: He is walking, golfing, and exercising at the [facility] several days weekly.  Depression/mood screen: Negative Hearing: Intact to whispered voice Visual acuity: Grossly normal, performs annual eye exam  ADLs: Capable Fall risk: None Home safety: Good Cognitive evaluation: Intact to orientation, naming, recall and repetition EOL planning: Adv directives not completed.   Preventative Medicine: Prevar due today, administered. Rx printed for Shingrix, He will check on insurance coverage regarding tetanus. Colonoscopy UTD. PSA UTD. Recommended regular exercise, healthy diet. Exam noted above. Labs reviewed. All recommendations provided at end of visit.  Next appointment: One year

## 2018-10-01 NOTE — Assessment & Plan Note (Signed)
Using Triamcinolone PRN that was originally provided by dermatology. He's not sure of the etiology of the rash, was never told. Refill provided.

## 2018-10-01 NOTE — Assessment & Plan Note (Signed)
Prevar due today, administered. Rx printed for Shingrix, He will check on insurance coverage regarding tetanus. Colonoscopy UTD. PSA UTD. Recommended regular exercise, healthy diet. Exam noted above. Labs reviewed. All recommendations provided at end of visit.  I have personally reviewed and have noted: 1. The patient's medical and social history 2. Their use of alcohol, tobacco or illicit drugs 3. Their current medications and supplements 4. The patient's functional ability including ADL's, fall  risks, home safety risks and hearing or visual  impairment. 5. Diet and physical activities 6. Evidence for depression or mood disorder

## 2018-10-02 ENCOUNTER — Ambulatory Visit (INDEPENDENT_AMBULATORY_CARE_PROVIDER_SITE_OTHER): Payer: PPO | Admitting: Sports Medicine

## 2018-10-02 ENCOUNTER — Encounter: Payer: Self-pay | Admitting: Sports Medicine

## 2018-10-02 VITALS — BP 142/84 | HR 64 | Ht 68.0 in | Wt 179.2 lb

## 2018-10-02 DIAGNOSIS — R29898 Other symptoms and signs involving the musculoskeletal system: Secondary | ICD-10-CM

## 2018-10-02 DIAGNOSIS — M25551 Pain in right hip: Secondary | ICD-10-CM | POA: Diagnosis not present

## 2018-10-02 DIAGNOSIS — M24551 Contracture, right hip: Secondary | ICD-10-CM | POA: Diagnosis not present

## 2018-10-02 DIAGNOSIS — M25552 Pain in left hip: Secondary | ICD-10-CM

## 2018-10-02 NOTE — Progress Notes (Signed)
Alec Wilson, [name] at [facility] [phone]  Alec Wilson - 67 y.o. male MRN [id number]  Date of birth: 1951/06/13  Visit Date: 10/02/2018  PCP: Alec Koch, NP   Referred by: Alec Koch, NP   Scribe(s) for today's visit: Alec Wilson, CMA  SUBJECTIVE:  Alec Wilson "Alec Wilson" is here for Initial Assessment (Hip pain)   HPI: His B hip pain symptoms INITIALLY: Began 1-2 years ago and MOI is unknown.  Described as mild-severe burning, radiating to both legs, R>L. Pain started at the ankle.  Worsened after exercising, sitting in certain types of chairs for prolonged periods of time, going up stairs.  Improved with rest.  Additional associated symptoms include: He reports hx of muscular LBP. Minimal pain in the mornings but pain does disturb him at night. Pain is mostly lateral and radiates into the gluteal region and down the front of the thighs. He denies swelling. He has some weakness while going up stairs, R>L. He denies n/t in the legs. He denies past surgeries to the lower back or hips. He had hernia repair last May.     At this time symptoms are worsening compared to onset. He has been taking IBU with some relief, he is able to sleep. He has tried heat and OTC topical analgesic with minima relief.   No recent XR of L-spine, XR B hips 04/29/2014.   REVIEW OF SYSTEMS: Reports night time disturbances. Denies fevers, chills, or night sweats. Denies unexplained weight loss. Denies personal history of cancer. Denies changes in bowel or bladder habits. Denies recent unreported falls. Denies new or worsening dyspnea or wheezing. Denies headaches or dizziness.  Reports weakness in the lower extremities but denies n/t.  Denies dizziness or presyncopal episodes Denies lower extremity edema    HISTORY:  Prior history reviewed and updated per electronic medical record.  Social History   Occupational History    . Not on file  Tobacco Use  . Smoking status: Former Smoker    Packs/day: 1.00    Types: Cigarettes  . Smokeless tobacco: Former Systems developer    Quit date: 02/19/1998  Substance and Sexual Activity  . Alcohol use: Yes    Alcohol/week: 2.0 standard drinks    Types: 2 Glasses of wine per week    Comment: weekly  . Drug use: No  . Sexual activity: Yes    Birth control/protection: Surgical   Social History   Social History Narrative   From [place].   Works at [employer] in [employer].   Married.   2 children: 39, 27. Daughters live in [place] and [place].   Enjoys golfing, visiting vineyards, traveling.    Past Medical History:  Diagnosis Date  . Adenomatous colon polyp    last colonoscopy was 3 years ago.  . Anemia   . Atrial Fibrillation    Stress ECHO-Negative (09/22/98)  . Diverticulosis   . Hemorrhoids summer 2005   s/p ligation of internal hemorrhoids  . HTN (hypertension)   . Hyperlipidemia   . Inguinal hernia   . RMSF [facility] spotted fever) July 2005   Past Surgical History:  Procedure Laterality Date  . cardiolite study  2004   negative  . COLONOSCOPY  2013  . [place]  2005  . INGUINAL HERNIA REPAIR Right 2019  . THROAT SURGERY  childhood  . VASECTOMY     family history includes Arthritis in his mother;  Cancer in his maternal grandfather and maternal grandmother; Colon polyps in his father; Coronary artery disease (age of onset: 41) in his father; Heart attack in his paternal grandfather; Heart disease in his father and mother; Hypertension in his brother and mother; Parkinson's disease in his father. There is no history of Rectal cancer, Stomach cancer, Colon cancer, or Esophageal cancer.  DATA OBTAINED & REVIEWED:  No results for input(s): HGBA1C, LABURIC, CREATINE in the last 8760 hours. No problems updated. . Prior hip x-rays in 2014 negative for osteoarthritis  OBJECTIVE:  VS:  HT:5\' 8"  (172.7 cm)   WT:179 lb 3.2 oz (81.3  kg)  BMI:27.25    BP:(!) 142/84  HR:64bpm  TEMP: ( )  RESP:95 %   PHYSICAL EXAM: CONSTITUTIONAL: Well-developed, Well-nourished and In no acute distress PSYCHIATRIC: Alert & appropriately interactive. and Not depressed or anxious appearing. RESPIRATORY: No increased work of breathing and Trachea Midline EYES: Pupils are equal., EOM intact without nystagmus. and No scleral icterus.  VASCULAR EXAM: Warm and well perfused NEURO: unremarkable  MSK Exam: Bilateral hip  Well aligned, no significant deformity. No overlying skin changes. No focal bony tenderness TTP over Gluteal musculature right worse than left.   RANGE OF MOTION & STRENGTH  Normal internal and external rotation of bilateral hips.  Negative Stinchfield testing bilaterally.  He does have marked pain and weakness with hip abduction testing with TFL predominant recruitment pattern worse on the right than on the left with both strength and dysfunctional recruitment.   SPECIALITY TESTING:  Negative straight leg raise.  He is able to heel toe walk without difficulty  Negative logroll     ASSESSMENT   1. Bilateral hip pain   2. Right hip flexor tightness   3. Weakness of right hip     PLAN:  Pertinent additional documentation may be included in corresponding procedure notes, imaging studies, problem based documentation and patient instructions.  Procedures:  . None  Medications:  No orders of the defined types were placed in this encounter.  Discussion/Instructions: No problem-specific Assessment & Plan notes found for this encounter.  . Discussed the underlying features of tight hip flexors leading to crouched, fetal like position that results in spinal column compression.  Including lumbar hyperflexion with hypermobility, thoracic flexion with restrictive rotation and cervical lordosis reversal  . Markedly dysfunctional hip abduction strength and recruitment pattern.  Home therapeutic exercise handout provided  focusing on hip abductors and hip flexor stretching. .   . Links to [employer] provided today per Patient Instructions.  These exercises were developed by Alec Wilson, DC with a strong emphasis on core neuromuscular reducation and postural realignment through body-weight exercises. . Discussed red flag symptoms that warrant earlier emergent evaluation and patient voices understanding. . Activity modifications and the importance of avoiding exacerbating activities (limiting pain to no more than a 4 / 10 during or following activity) recommended and discussed.  Follow-up:  . Return in about 6 weeks (around 11/13/2018) for repeat clinical exam, addition of therapeutic exercises.  . If any lack of improvement: consider further diagnostic evaluation with X-rays of the lumbar spine and/or repeat hip x-rays and consider referral to Physical Therapy . At follow up will plan: To advance his home therapeutic exercise program.     CMA/ATC served as scribe during this visit. History, Physical, and Plan performed by medical provider. Documentation and orders reviewed and attested to.      Gerda Diss, [place] [facility] Physician

## 2018-10-02 NOTE — Patient Instructions (Addendum)

## 2018-10-30 DIAGNOSIS — K4090 Unilateral inguinal hernia, without obstruction or gangrene, not specified as recurrent: Secondary | ICD-10-CM | POA: Diagnosis not present

## 2018-11-06 ENCOUNTER — Ambulatory Visit: Payer: PPO | Admitting: Sports Medicine

## 2018-11-22 ENCOUNTER — Ambulatory Visit: Payer: PPO | Admitting: Sports Medicine

## 2018-12-14 ENCOUNTER — Ambulatory Visit (INDEPENDENT_AMBULATORY_CARE_PROVIDER_SITE_OTHER): Payer: PPO | Admitting: Primary Care

## 2018-12-14 VITALS — BP 126/80 | HR 62 | Temp 98.0°F | Ht 68.0 in | Wt 181.8 lb

## 2018-12-14 DIAGNOSIS — B9789 Other viral agents as the cause of diseases classified elsewhere: Secondary | ICD-10-CM

## 2018-12-14 DIAGNOSIS — J019 Acute sinusitis, unspecified: Secondary | ICD-10-CM

## 2018-12-14 HISTORY — DX: Acute sinusitis, unspecified: J01.90

## 2018-12-14 HISTORY — DX: Other viral agents as the cause of diseases classified elsewhere: B97.89

## 2018-12-14 NOTE — Progress Notes (Signed)
Subjective:    Patient ID: Alec Wilson, male    DOB: 28-Aug-1951, 68 y.o.   MRN: [id number]  HPI  Alec Wilson is a 68 year old male who presents today with a chief complaint of sinus pressure.  He also reports cough, ear pain, headache, nasal congestion. His cough is mild and is dry overall. He's draining green and bloody mucous from his nasal cavity. His symptoms began 5 days ago. Overall he's slightly better. He's been taking Zyrtec, benadryl, Nyquil and Dayquil capsules. No medications for the last two days. He denies fevers, sick contacts.   He was on a trip at [facility] when symptoms began.   Review of Systems  Constitutional: Negative for chills, fatigue and fever.  HENT: Positive for congestion, ear pain and sinus pressure.   Respiratory: Positive for cough. Negative for shortness of breath.   Neurological: Positive for headaches.       Past Medical History:  Diagnosis Date  . Adenomatous colon polyp    last colonoscopy was 3 years ago.  . Anemia   . Atrial Fibrillation    Stress ECHO-Negative (09/22/98)  . Diverticulosis   . Hemorrhoids summer 2005   s/p ligation of internal hemorrhoids  . HTN (hypertension)   . Hyperlipidemia   . Inguinal hernia   . RMSF [facility] spotted fever) July 2005     Social History   Socioeconomic History  . Marital status: Married    Spouse name: Not on file  . Number of children: Not on file  . Years of education: Not on file  . Highest education level: Not on file  Occupational History  . Not on file  Social Needs  . Financial resource strain: Not on file  . Food insecurity:    Worry: Not on file    Inability: Not on file  . Transportation needs:    Medical: Not on file    Non-medical: Not on file  Tobacco Use  . Smoking status: Former Smoker    Packs/day: 1.00    Types: Cigarettes  . Smokeless tobacco: Former Systems developer    Quit date: 02/19/1998  Substance and Sexual Activity  . Alcohol use: Yes    Alcohol/week: 2.0  standard drinks    Types: 2 Glasses of wine per week    Comment: weekly  . Drug use: No  . Sexual activity: Yes    Birth control/protection: Surgical  Lifestyle  . Physical activity:    Days per week: Not on file    Minutes per session: Not on file  . Stress: Not on file  Relationships  . Social connections:    Talks on phone: Not on file    Gets together: Not on file    Attends religious service: Not on file    Active member of club or organization: Not on file    Attends meetings of clubs or organizations: Not on file    Relationship status: Not on file  . Intimate partner violence:    Fear of current or ex partner: Not on file    Emotionally abused: Not on file    Physically abused: Not on file    Forced sexual activity: Not on file  Other Topics Concern  . Not on file  Social History Narrative   From [place].   Works at [employer] in [employer].   Married.   2 children: 39, 27. Daughters live in [place] and [place].   Enjoys golfing,  visiting vineyards, traveling.    Past Surgical History:  Procedure Laterality Date  . cardiolite study  2004   negative  . COLONOSCOPY  2013  . [place]  2005  . INGUINAL HERNIA REPAIR Right 2019  . THROAT SURGERY  childhood  . VASECTOMY      Family History  Problem Relation Age of Onset  . Coronary artery disease Father 21       s/p MI in earlly 70s  . Heart disease Father   . Parkinson's disease Father   . Colon polyps Father   . Hypertension Brother   . Heart disease Mother   . Hypertension Mother   . Arthritis Mother   . Cancer Maternal Grandmother   . Cancer Maternal Grandfather   . Heart attack Paternal Grandfather   . Rectal cancer Neg Hx   . Stomach cancer Neg Hx   . Colon cancer Neg Hx   . Esophageal cancer Neg Hx     No Known Allergies  Current Outpatient Medications on File Prior to Visit  Medication Sig Dispense Refill  . aspirin 81 MG tablet Take 81 mg by mouth daily.    .  fish oil-omega-3 fatty acids 1000 MG capsule Take 1 g by mouth daily.    Marland Kitchen losartan (COZAAR) 25 MG tablet Take 1 tablet (25 mg total) by mouth daily. For blood pressure. 90 tablet 3  . Multiple Vitamins-Minerals (MULTIVITAMIN PO) Take 1 Centrum Silver multivitamin by mouth daily    . simvastatin (ZOCOR) 20 MG tablet TAKE 1 TABLET BY MOUTH  EVERY EVENING for cholesterol. 90 tablet 3  . triamcinolone cream (KENALOG) 0.1 % Apply 1 application topically 2 (two) times daily as needed. 30 g 0   No current facility-administered medications on file prior to visit.     BP 126/80   Pulse 62   Temp 98 F (36.7 C) (Oral)   Ht 5\' 8"  (1.727 m)   Wt 181 lb 12 oz (82.4 kg)   SpO2 98%   BMI 27.63 kg/m    Objective:   Physical Exam  Constitutional: He appears well-nourished. He does not appear ill.  HENT:  Right Ear: Tympanic membrane and ear canal normal.  Left Ear: Tympanic membrane and ear canal normal.  Nose: Mucosal edema present. Right sinus exhibits no maxillary sinus tenderness and no frontal sinus tenderness. Left sinus exhibits maxillary sinus tenderness. Left sinus exhibits no frontal sinus tenderness.  Mouth/Throat: Oropharynx is clear and moist.  Neck: Neck supple.  Cardiovascular: Normal rate and regular rhythm.  Respiratory: Effort normal and breath sounds normal. He has no wheezes.  Skin: Skin is warm and dry.           Assessment & Plan:

## 2018-12-14 NOTE — Patient Instructions (Signed)
Your symptoms are representative of a viral illness which will resolve on its own over time. Our goal is to treat your symptoms in order to aid your body in the healing process and to make you more comfortable.   Continue Zyrtec or Benadryl as discussed.  Nasal Congestion/Ear Pressure/Sinus Pressure: Try using Flonase (fluticasone) nasal spray. Instill 1 spray in each nostril twice daily.   Please message me Tuesday next week if you notice fevers, your symptoms become worse, you continue to notice green mucous from your nasal cavity.  It was a pleasure to see you today!

## 2018-12-14 NOTE — Assessment & Plan Note (Signed)
Sinus pressure, nasal congestion x 5 days.  Exam today not consistent for bacterial involvement. He doesn't appear sickly. Suspect viral cause and will treat with conservative measures. Offered steroid injection for which he kindly declines. Discussed use of Zyrtec, Flonase. Return precautions provided.

## 2019-01-09 DIAGNOSIS — G8918 Other acute postprocedural pain: Secondary | ICD-10-CM | POA: Diagnosis not present

## 2019-01-09 DIAGNOSIS — K4090 Unilateral inguinal hernia, without obstruction or gangrene, not specified as recurrent: Secondary | ICD-10-CM | POA: Diagnosis not present

## 2019-08-29 ENCOUNTER — Ambulatory Visit (INDEPENDENT_AMBULATORY_CARE_PROVIDER_SITE_OTHER): Payer: PPO

## 2019-08-29 DIAGNOSIS — Z23 Encounter for immunization: Secondary | ICD-10-CM

## 2019-10-02 DIAGNOSIS — H524 Presbyopia: Secondary | ICD-10-CM | POA: Diagnosis not present

## 2019-10-07 ENCOUNTER — Encounter: Payer: Self-pay | Admitting: Family Medicine

## 2019-10-07 ENCOUNTER — Ambulatory Visit (INDEPENDENT_AMBULATORY_CARE_PROVIDER_SITE_OTHER): Payer: PPO | Admitting: Family Medicine

## 2019-10-07 ENCOUNTER — Other Ambulatory Visit: Payer: Self-pay

## 2019-10-07 ENCOUNTER — Telehealth: Payer: Self-pay

## 2019-10-07 VITALS — BP 144/80 | HR 62 | Temp 97.6°F | Resp 18 | Ht 68.0 in | Wt 179.2 lb

## 2019-10-07 DIAGNOSIS — R011 Cardiac murmur, unspecified: Secondary | ICD-10-CM

## 2019-10-07 DIAGNOSIS — Z125 Encounter for screening for malignant neoplasm of prostate: Secondary | ICD-10-CM | POA: Diagnosis not present

## 2019-10-07 DIAGNOSIS — R0789 Other chest pain: Secondary | ICD-10-CM

## 2019-10-07 DIAGNOSIS — I10 Essential (primary) hypertension: Secondary | ICD-10-CM | POA: Diagnosis not present

## 2019-10-07 DIAGNOSIS — E785 Hyperlipidemia, unspecified: Secondary | ICD-10-CM | POA: Diagnosis not present

## 2019-10-07 HISTORY — DX: Other chest pain: R07.89

## 2019-10-07 LAB — LIPID PANEL
Cholesterol: 187 mg/dL (ref 0–200)
HDL: 81 mg/dL (ref >39.00)
LDL Cholesterol: 82 mg/dL (ref 0–99)
NonHDL: 106.25
Total CHOL/HDL Ratio: 2
Triglycerides: 120 mg/dL (ref 0.0–149.0)
VLDL: 24 mg/dL (ref 0.0–40.0)

## 2019-10-07 LAB — COMPREHENSIVE METABOLIC PANEL
ALT: 20 U/L (ref 0–53)
AST: 15 U/L (ref 0–37)
Albumin: 4.4 g/dL (ref 3.5–5.2)
Alkaline Phosphatase: 104 U/L (ref 39–117)
BUN: 17 mg/dL (ref 6–23)
CO2: 28 mEq/L (ref 19–32)
Calcium: 9.9 mg/dL (ref 8.4–10.5)
Chloride: 103 mEq/L (ref 96–112)
Creatinine, Ser: 0.75 mg/dL (ref 0.40–1.50)
GFR: 104.13 mL/min (ref >60.00)
Glucose, Bld: 98 mg/dL (ref 70–99)
Potassium: 4.8 mEq/L (ref 3.5–5.1)
Sodium: 139 mEq/L (ref 135–145)
Total Bilirubin: 0.6 mg/dL (ref 0.2–1.2)
Total Protein: 6.8 g/dL (ref 6.0–8.3)

## 2019-10-07 LAB — PSA: PSA: 1.56 ng/mL (ref 0.10–4.00)

## 2019-10-07 MED ORDER — SIMVASTATIN 20 MG PO TABS: ORAL_TABLET | ORAL | 0 refills | Status: DC

## 2019-10-07 MED ORDER — LOSARTAN POTASSIUM 25 MG PO TABS: 25.0000 mg | ORAL_TABLET | Freq: Every day | ORAL | 0 refills | Status: DC

## 2019-10-07 NOTE — Telephone Encounter (Signed)
Patient was advised to schedule CPE with Alma Friendly, NP for more refills.

## 2019-10-07 NOTE — Assessment & Plan Note (Signed)
Etiology unclear. EKG reassuring with sinus bradycardia. Given personal hx of afib as well as family hx of heart disease/stroke due feel he is high risk and referral to cardiology for work-up. Advised trial of antacid for possible heartburn and ER precautions discussed. For lightheadedness recommended increased hydration and slow transitions

## 2019-10-07 NOTE — Telephone Encounter (Signed)
See visit note from 10/07/19

## 2019-10-07 NOTE — Progress Notes (Signed)
Subjective:     Alec Wilson is a 68 y.o. male presenting for Chest Pain (x 1 week. has had some dizziness, feels like heart skips a beat, chest pressure. some heartburn also.)     Chest Pain  This is a new problem. The current episode started in the past 7 days. The problem occurs every several days. The pain is present in the substernal region. The quality of the pain is described as pressure and burning. The pain does not radiate. Associated symptoms include dizziness (with standing), irregular heartbeat and palpitations. The pain is aggravated by exertion. Treatments tried: ASA. The treatment provided no relief. Risk factors include male gender.  His past medical history is significant for arrhythmia, hyperlipidemia and hypertension.  His family medical history is significant for CAD, heart disease, hyperlipidemia, hypertension and stroke.     Hx of Afib in the past, this resolved. Has not seen cardiology in years  Tightness is not always attached the heart irregularity The dizziness is with position changes The tightness happened when he laid down last night  Tightness - lasts 15 minutes to 3 hours - not sure what makes it go away, occasionally rest  Has noticed his blood pressure has been high the last few days  Review of Systems  Cardiovascular: Positive for chest pain and palpitations.  Gastrointestinal:       Heartburn  Neurological: Positive for dizziness (with standing).     Social History   Tobacco Use  Smoking Status Former Smoker  . Packs/day: 1.00  . Types: Cigarettes  Smokeless Tobacco Former Systems developer  . Quit date: 02/19/1998        Objective:    BP Readings from Last 3 Encounters:  10/07/19 (!) 144/80  12/14/18 126/80  10/02/18 (!) 142/84   Wt Readings from Last 3 Encounters:  10/07/19 179 lb 4 oz (81.3 kg)  12/14/18 181 lb 12 oz (82.4 kg)  10/02/18 179 lb 3.2 oz (81.3 kg)    BP (!) 144/80   Pulse 62   Temp 97.6 F (36.4 C)   Resp 18   Ht  5\' 8"  (1.727 m)   Wt 179 lb 4 oz (81.3 kg)   SpO2 98%   BMI 27.25 kg/m    Physical Exam Constitutional:      Appearance: Normal appearance. He is not ill-appearing or diaphoretic.  HENT:     Right Ear: External ear normal.     Left Ear: External ear normal.     Nose: Nose normal.  Eyes:     General: No scleral icterus.    Extraocular Movements: Extraocular movements intact.     Conjunctiva/sclera: Conjunctivae normal.  Neck:     Musculoskeletal: Neck supple.  Cardiovascular:     Rate and Rhythm: Regular rhythm. Bradycardia present.     Heart sounds: No murmur.  Pulmonary:     Effort: Pulmonary effort is normal. No accessory muscle usage or respiratory distress.  Abdominal:     General: Bowel sounds are normal.     Palpations: Abdomen is soft. There is no mass.     Tenderness: There is no abdominal tenderness. There is no guarding.  Skin:    General: Skin is warm and dry.  Neurological:     Mental Status: He is alert. Mental status is at baseline.  Psychiatric:        Mood and Affect: Mood normal.     EKG: Sinus bradycardia. No ST elevation or T-wave abnormalities  Assessment & Plan:   Problem List Items Addressed This Visit      Cardiovascular and Mediastinum   HTN (hypertension)    BP mildly elevated today. Advised continued home monitoring and f/u with PCP if elevated. Labs today as it has been 1 year.       Relevant Orders   Ambulatory referral to Cardiology     Other   Atrial Fibrillation   Relevant Orders   Comprehensive metabolic panel   Ambulatory referral to Cardiology   Hyperlipidemia   Relevant Orders   Lipid Profile   Chest pressure - Primary    Etiology unclear. EKG reassuring with sinus bradycardia. Given personal hx of afib as well as family hx of heart disease/stroke due feel he is high risk and referral to cardiology for work-up. Advised trial of antacid for possible heartburn and ER precautions discussed. For lightheadedness  recommended increased hydration and slow transitions      Relevant Orders   EKG 12-Lead (Completed)   Ambulatory referral to Cardiology    Other Visit Diagnoses    Screening for prostate cancer       Relevant Orders   PSA       Return if symptoms worsen or fail to improve.  Lesleigh Noe, MD

## 2019-10-07 NOTE — Assessment & Plan Note (Signed)
BP mildly elevated today. Advised continued home monitoring and f/u with PCP if elevated. Labs today as it has been 1 year.

## 2019-10-07 NOTE — Patient Instructions (Addendum)
Your blood pressure high.   High blood pressure increases your risk for heart attack and stroke.    Please check your blood pressure 2-4 times a week.   To check your blood pressure 1) Sit in a quiet and relaxed place for 5 minutes 2) Make sure your feet are flat on the ground 3) Consider checking first thing in the morning   Normal blood pressure is less than 140/90 Ideally you blood pressure should be around 120/80   Cardiology referral - may take a few weeks If symptoms worsening call and we can try to move this up  Also go to the ER if you get chest pain that does not go away or if you pain is getting worse or starts happening with activity and does not resolve within 15 minutes.

## 2019-10-07 NOTE — Telephone Encounter (Signed)
Pt used to see cardiologist for afib but released years ago.on no med for afib. For one wk pt has had on and off episodes of chest pressure and tightness with skip of heart beat and lightheadedness for few seconds and then symptoms resolve. No symptoms now. Pt has no covid symptoms, no travel and no known exposure to + covid. Pt took BP 156/88 and P 75. Pt took losartan 25 mg 30 ' ago. Pt rechecked BP 147/84 P 75 . Pt wanted to be seen at [facility] today. Pt scheduled appt with Dr Einar Pheasant today at 11:40. UC & ED precautions given and pt voiced understanding.

## 2019-10-11 ENCOUNTER — Other Ambulatory Visit: Payer: Self-pay

## 2019-10-22 HISTORY — PX: OTHER SURGICAL HISTORY: SHX169

## 2019-10-25 ENCOUNTER — Other Ambulatory Visit: Payer: Self-pay | Admitting: Primary Care

## 2019-10-28 ENCOUNTER — Other Ambulatory Visit: Payer: Self-pay

## 2019-10-28 ENCOUNTER — Encounter: Payer: Self-pay | Admitting: Cardiology

## 2019-10-28 ENCOUNTER — Encounter: Payer: Self-pay | Admitting: *Deleted

## 2019-10-28 ENCOUNTER — Ambulatory Visit (INDEPENDENT_AMBULATORY_CARE_PROVIDER_SITE_OTHER): Payer: PPO | Admitting: Cardiology

## 2019-10-28 VITALS — BP 138/88 | HR 70 | Ht 69.0 in | Wt 181.0 lb

## 2019-10-28 DIAGNOSIS — I10 Essential (primary) hypertension: Secondary | ICD-10-CM

## 2019-10-28 DIAGNOSIS — E782 Mixed hyperlipidemia: Secondary | ICD-10-CM | POA: Diagnosis not present

## 2019-10-28 DIAGNOSIS — R011 Cardiac murmur, unspecified: Secondary | ICD-10-CM | POA: Diagnosis not present

## 2019-10-28 DIAGNOSIS — I341 Nonrheumatic mitral (valve) prolapse: Secondary | ICD-10-CM

## 2019-10-28 DIAGNOSIS — R079 Chest pain, unspecified: Secondary | ICD-10-CM

## 2019-10-28 DIAGNOSIS — R0789 Other chest pain: Secondary | ICD-10-CM

## 2019-10-28 NOTE — Progress Notes (Signed)
Primary Care Provider: Pleas Koch, NP Cardiologist: No primary care provider on file.  New to me Electrophysiologist: n/a  Clinic Note: Chief Complaint  Patient presents with   New Patient (Initial Visit)    Chest discomfort establish cardiology care   Chest Pain   Atrial Fibrillation    No records    HPI:    Alec Wilson is a 68 y.o. male with a documented history of hypertension, hyperlipidemia and report atrial fibrillation who is being seen today for the evaluation of CHEST PRESSURE, ATRIAL FIBRILLATION at the request of Lesleigh Noe, MD.  Alec Wilson was last seen on November 16 by Alec Barman, NP with complaints of 1 week of chest pressure, feeling heart skipping, heartburn and dizziness.  Symptoms seem to be exacerbated by exertion.  Also noted dizziness with standing.  Aspirin had no relief.  Chest tightness not always associated with heart irregularity.  May also happen with lying down at night.  Recent Hospitalizations: N/A  Reviewed  CV studies:    The following studies were reviewed today: (if available, images/films reviewed: From Epic Chart or Care Everywhere)  Exercise Myoview May 2004:: Excellent exercise response (14 minutes).  Normal EKG.  Normal EF and wall motion.  Normal perfusion.   Interval History:   Alec Wilson presents here today for evaluation of chest discomfort this been going on for the last 3 to 4 months now.  Actually over the last couple weeks since her visit with Alec Friendly, NP, he has not had any further episodes.  Describing them he says that they have been going on a few months ago and then came back with a vengeance in November.  They seem to last less than a minute 12 for least 30 minutes at a time.  They can get worse with exertion, but not always associated with exertion.  The tightness seems to be left upper chest but can be felt throughout.  He is actually has some of the discomfort with lying down at night  as well. He also notes some irregular heartbeat skipped beat sensations that can be associated with a chest discomfort but they can happen irrespective of each other also.  He has a distant history of atrial fib (suggestion of mitral prolapse) and was seen by Dr.Tennant.  Was essentially discharged from his care with no recurrent symptoms.  As far as he can tell, he has not had prolonged episodes to make him think that he is actually had atrial fib.  He has been having off-and-on palpitations but not lasting very long.  He does not feel like his heart is going fast is beating irregular.  CV Review of Symptoms (Summary): positive for - chest pain, dyspnea on exertion, irregular heartbeat, palpitations, rapid heart rate, shortness of breath and Some lightheadedness and dizziness with palpitations negative for - edema, loss of consciousness, orthopnea, paroxysmal nocturnal dyspnea, rapid heart rate, shortness of breath or Syncope/near syncope, TIA/amaurosis fugax  The patient does not have symptoms concerning for COVID-19 infection (fever, chills, cough, or new shortness of breath).  The patient is practicing social distancing. ++ Masking.  He still goes out for groceries/shopping.    REVIEWED OF SYSTEMS   A comprehensive ROS was performed. Review of Systems  Constitutional: Negative for malaise/fatigue (Actually energy is getting better over the last month) and weight loss.  HENT: Negative for congestion and nosebleeds.   Respiratory: Positive for shortness of breath (Only exertional). Negative for cough  and wheezing.   Cardiovascular: Positive for chest pain (Per HPI). Negative for leg swelling.  Gastrointestinal: Negative for abdominal pain, blood in stool, heartburn and melena.  Genitourinary: Negative for hematuria.  Musculoskeletal: Positive for joint pain (Mild joint pains). Negative for falls.  Neurological: Positive for dizziness (Sometimes positional, sometimes with palpitations).  Negative for focal weakness, weakness and headaches.  Endo/Heme/Allergies: Negative for environmental allergies.  Psychiatric/Behavioral: Negative for memory loss. The patient does not have insomnia.   All other systems reviewed and are negative.  I have reviewed and (if needed) personally updated the patient's problem list, medications, allergies, past medical and surgical history, social and family history.   PAST MEDICAL HISTORY   Past Medical History:  Diagnosis Date   Adenomatous colon polyp    last colonoscopy was 3 years ago.   Anemia    Diverticulosis    Hemorrhoids summer 2005   s/p ligation of internal hemorrhoids   HTN (hypertension)    Hyperlipidemia    Inguinal hernia    PAF (paroxysmal atrial fibrillation) ([place]) 1999   Stress ECHO-Negative (09/22/98)   RMSF [facility] spotted fever) July 2005     PAST SURGICAL HISTORY   Past Surgical History:  Procedure Laterality Date   cardiolite study  2004   negative   COLONOSCOPY  2013   HEMORRHOID SURGERY  2005   INGUINAL HERNIA REPAIR Right 2019   THROAT SURGERY  childhood   VASECTOMY      MEDICATIONS/ALLERGIES   Current Meds  Medication Sig   aspirin 81 MG tablet Take 81 mg by mouth daily.   fish oil-omega-3 fatty acids 1000 MG capsule Take 1 g by mouth daily.   losartan (COZAAR) 25 MG tablet Take 1 tablet (25 mg total) by mouth daily. For blood pressure.   Multiple Vitamins-Minerals (MULTIVITAMIN PO) Take 1 Centrum Silver multivitamin by mouth daily   simvastatin (ZOCOR) 20 MG tablet TAKE 1 TABLET BY MOUTH  EVERY EVENING for cholesterol.   triamcinolone cream (KENALOG) 0.1 % Apply 1 application topically 2 (two) times daily as needed.    No Known Allergies   SOCIAL HISTORY/FAMILY HISTORY   Social History   Tobacco Use   Smoking status: Former Smoker    Packs/day: 1.00    Types: Cigarettes   Smokeless tobacco: Former Systems developer    Quit date: 02/19/1998  Substance Use Topics    Alcohol use: Yes    Alcohol/week: 2.0 standard drinks    Types: 2 Glasses of wine per week    Comment: weekly   Drug use: No   Social History   Social History Narrative   From [place].   Works at [employer] in [employer].   Married.  2 children: 39, 27.    Daughters live in [place] and [place].   Enjoys golfing, visiting vineyards, traveling --> exercises 6 days a week 1 walking and playing golf      Quit smoking in 1999.   Drinks roughly 5 glasses of wine a week.    Family History family history includes Arthritis in his mother; Cancer in his maternal grandfather and maternal grandmother; Colon polyps in his father; Congestive Heart Failure in his father; Coronary artery disease (age of onset: 10) in his father; Heart attack (age of onset: 68) in his paternal grandfather; Heart attack (age of onset: 9) in his mother; Hypertension in his brother and mother; Parkinson's disease in his father.   OBJCTIVE -PE, EKG, labs   Wt Readings from  Last 3 Encounters:  10/28/19 181 lb (82.1 kg)  10/07/19 179 lb 4 oz (81.3 kg)  12/14/18 181 lb 12 oz (82.4 kg)    Physical Exam: BP 138/88    Pulse 70    Ht 5\' 9"  (1.753 m)    Wt 181 lb (82.1 kg)    SpO2 98%    BMI 26.73 kg/m  Physical Exam  Constitutional: He is oriented to person, place, and time. He appears well-developed and well-nourished. No distress.  HENT:  Head: Normocephalic and atraumatic.  Eyes: Pupils are equal, round, and reactive to light. Conjunctivae and EOM are normal.  Neck: No hepatojugular reflux and no JVD present. Carotid bruit is not present. No tracheal deviation present.  Cardiovascular: Normal rate, normal heart sounds, intact distal pulses and normal pulses. An irregular rhythm present.  Occasional extrasystoles are present. PMI is not displaced. Exam reveals no gallop and no friction rub.  No murmur heard. Cannot exclude midsystolic click  Pulmonary/Chest: Effort normal and breath sounds normal. No  respiratory distress. He has no wheezes. He has no rales.  Abdominal: Soft. Bowel sounds are normal. He exhibits no distension. There is no abdominal tenderness. There is no rebound.  Musculoskeletal:        General: No edema. Normal range of motion.     Cervical back: Normal range of motion and neck supple.  Neurological: He is alert and oriented to person, place, and time. No cranial nerve deficit.  Psychiatric: He has a normal mood and affect. His behavior is normal. Judgment and thought content normal.  Vitals reviewed.    Adult ECG Report -October 07, 2019  Rate: 58 ;  Rhythm: sinus bradycardia and Otherwise normal axis, intervals and durations.;   Narrative Interpretation: Normal EKG  Recent Labs:    Lab Results  Component Value Date   CHOL 187 10/07/2019   HDL 81.00 10/07/2019   LDLCALC 82 10/07/2019   TRIG 120.0 10/07/2019   CHOLHDL 2 10/07/2019   Lab Results  Component Value Date   CREATININE 0.75 10/07/2019   BUN 17 10/07/2019   NA 139 10/07/2019   K 4.8 10/07/2019   CL 103 10/07/2019   CO2 28 10/07/2019    ASSESSMENT/PLAN   Problem List Items Addressed This Visit    Essential hypertension (Chronic)    Blood pressure borderline today.  Is on losartan at low-dose.  Reassess in follow-up visit, may consider increasing depending on what the stress test and echo demonstrate.      Hyperlipidemia (Chronic)    Lipids actually pretty well controlled on low-dose simvastatin.  Depending on what follow-up evaluation showed, may or may not be at goal.      Atrial Fibrillation    Apparently diagnosed back in 2004.  As far as I can tell, no recurrence but he is now having significant palpitation episodes.  Plan: 30-day cardiac event monitor and 2D echo.      Relevant Orders   ECHOCARDIOGRAM COMPLETE   LEXISCAN---MYOCARDIAL PERFUSION IMAGING   Cardiac event monitor   Chest pressure    Sounds concerning for possible angina.  Will evaluate with Myoview.       Relevant Orders   LEXISCAN---MYOCARDIAL PERFUSION IMAGING   Mitral valve prolapse    Reported history of mitral prolapse now with palpitations.  Very difficult to assess PA exam  Plan: Check 2D echo.      Relevant Orders   ECHOCARDIOGRAM COMPLETE   LEXISCAN---MYOCARDIAL PERFUSION IMAGING   Chest pain with moderate risk  for cardiac etiology - Primary    Some typical and otherwise atypical features for chest discomfort.  It is hard to tell if it really is exertional or not.  Occurs both with and without exertion.  However he does have a significant family history.   Plan: Check Lexiscan Myoview.      Relevant Orders   ECHOCARDIOGRAM COMPLETE   LEXISCAN---MYOCARDIAL PERFUSION IMAGING      COVID-19 Education: The signs and symptoms of COVID-19 were discussed with the patient and how to seek care for testing (follow up with PCP or arrange E-visit).   The importance of social distancing was discussed today.  I spent a total of 22 minutes with the patient and chart review. >  50% of the time was spent in direct patient consultation.  Additional time spent with chart review (studies, outside notes, etc): 14 Total Time: 36 min  Current medicines are reviewed at length with the patient today.  (+/- concerns) n/a   Patient Instructions / Medication Changes & Studies & Tests Ordered   Patient Instructions  Medication Instructions:  No changes  *If you need a refill on your cardiac medications before your next appointment, please call your pharmacy*  Lab Work: Not needed If you have labs (blood work) drawn today and your tests are completely normal, you will receive your results only by:  MyChart Message (if you have MyChart) OR  A paper copy in the mail If you have any lab test that is abnormal or we need to change your treatment, we will call you to review the results.  Testing/Procedures: Will be schedule at  [place] has requested  that you have an echocardiogram. Echocardiography is a painless test that uses sound waves to create images of your heart. It provides your doctor with information about the size and shape of your heart and how well your hearts chambers and valves are working. This procedure takes approximately one hour. There are no restrictions for this procedure. And Your physician has recommended that you wear an event monitor 30 day. Event monitors are medical devices that record the hearts electrical activity. Doctors most often [place] these monitors to diagnose arrhythmias. Arrhythmias are problems with the speed or rhythm of the heartbeat. The monitor is a small, portable device. You can wear one while you do your normal daily activities. This is usually used to diagnose what is causing palpitations/syncope (passing out).     Will be schedule at  [place] has requested that you have a lexiscan myoview. For further information please visit [email or web address]. Please follow instruction sheet, as given.    Follow-Up: At [facility], you and your health needs are our priority.  As part of our continuing mission to provide you with exceptional heart care, we have created designated Provider Care Teams.  These Care Teams include your primary Cardiologist (physician) and Advanced Practice Providers (APPs -  Physician Assistants and Nurse Practitioners) who all work together to provide you with the care you need, when you need it.  Your next appointment:   2 month(s)  The format for your next appointment:   In Person  Provider:   Glenetta Hew, MD  Other Instruction  [employer] Cardiac Event Monitor Instructions Your physician has requested you wear your cardiac event monitor for ___30__ days, (1-30). [employer] may call or text to confirm a shipping address. The monitor will be sent to a land address via UPS.  [employer] will not ship a monitor to a PO BOX. It  typically takes 3-5 days to receive your monitor after it has been enrolled. [employer] will assist with USPS tracking if your package is delayed. The telephone number for [employer] is [phone]. Once you have received your monitor, please review the enclosed instructions. Instruction tutorials can also be viewed under help and settings on the enclosed cell phone. Your monitor has already been registered assigning a specific monitor serial # to you.  Applying the monitor Remove cell phone from case and turn it on. The cell phone works as Dealer and needs to be within [employer] of you at all times. The cell phone will need to be charged on a daily basis. We recommend you plug the cell phone into the enclosed charger at your bedside table every night.  Monitor batteries: You will receive two monitor batteries labelled #1 and #2. These are your recorders. Plug battery #2 onto the second connection on the enclosed charger. Keep one battery on the charger at all times. This will keep the monitor battery deactivated. It will also keep it fully charged for when you need to switch your monitor batteries. A small light will be blinking on the battery emblem when it is charging. The light on the battery emblem will remain on when the battery is fully charged.  Open package of a Monitor strip. Insert battery #1 into black hood on strip and gently squeeze monitor battery onto connection as indicated in instruction booklet. Set aside while preparing skin.  Choose location for your strip, vertical or horizontal, as indicated in the instruction booklet. Shave to remove all hair from location. There cannot be any lotions, oils, powders, or colognes on skin where monitor is to be applied. Wipe skin clean with enclosed Saline wipe. Dry skin completely.  Peel paper labeled #1 off the back of the Monitor strip exposing the adhesive. Place the monitor on the chest in the vertical or horizontal  position shown in the instruction booklet. One arrow on the monitor strip must be pointing upward. Carefully remove paper labeled #2, attaching remainder of strip to your skin. Try not to create any folds or wrinkles in the strip as you apply it.  Firmly press and release the circle in the center of the monitor battery. You will hear a small beep. This is turning the monitor battery on. The heart emblem on the monitor battery will light up every 5 seconds if the monitor battery in turned on and connected to the patient securely. Do not push and hold the circle down as this turns the monitor battery off. The cell phone will locate the monitor battery. A screen will appear on the cell phone checking the connection of your monitor strip. This may read poor connection initially but change to good connection within the next minute. Once your monitor accepts the connection you will hear a series of 3 beeps followed by a climbing crescendo of beeps. A screen will appear on the cell phone showing the two monitor strip placement options. Touch the picture that demonstrates where you applied the monitor strip.  Your monitor strip and battery are waterproof. You are able to shower, bathe, or swim with the monitor on. They just ask you do not submerge deeper than 3 feet underwater. We recommend removing the monitor if you are swimming in a lake, river, or ocean.  Your monitor battery will need to be switched to a fully charged monitor battery approximately  once a week. The cell phone will alert you of an action which needs to be made.  On the cell phone, tap for details to reveal connection status, monitor battery status, and cell phone battery status. The green dots indicates your monitor is in good status. A red dot indicates there is something that needs your attention.  To record a symptom, click the circle on the monitor battery. In 30-60 seconds a list of symptoms will appear on the cell phone.  Select your symptom and tap save. Your monitor will record a sustained or significant arrhythmia regardless of you clicking the button. Some patients do not feel the heart rhythm irregularities. [employer] will notify us of any serious or critical events.  Refer to instruction booklet for instructions on switching batteries, changing strips, the Do not disturb or Pause features, or any additional questions.  Call [employer] at [phone], to confirm your monitor is transmitting and record your baseline. They will answer any questions you may have regarding the monitor instructions at that time.  Returning the monitor to [place] all equipment back into blue box. Peel off strip of paper to expose adhesive and close box securely. There is a prepaid UPS shipping label on this box. Drop in a UPS drop box, or at a UPS facility like [employer]. You may also contact [employer] to arrange UPS to pick up monitor package at your home.       Studies Ordered:   Orders Placed This Encounter  Procedures   LEXISCAN---MYOCARDIAL PERFUSION IMAGING   Cardiac event monitor   ECHOCARDIOGRAM COMPLETE     Glenetta Hew, M.D., M.S. Interventional Cardiologist   Pager # [phone] Phone # [phone] [address] [place], [place] [address]   Thank you for choosing [facility] at [facility]!!

## 2019-10-28 NOTE — Progress Notes (Signed)
Patient ID: Alec Wilson, male   DOB: 10-03-1951, 68 y.o.   MRN: [id number]  Patient enrolled for [employer] to ship a 30 day cardiac event monitor to his home.  Instructions will be included in the monitor kit.

## 2019-10-28 NOTE — Patient Instructions (Signed)
Medication Instructions:  No changes  *If you need a refill on your cardiac medications before your next appointment, please call your pharmacy*  Lab Work: Not needed If you have labs (blood work) drawn today and your tests are completely normal, you will receive your results only by: Marland Kitchen MyChart Message (if you have MyChart) OR . A paper copy in the mail If you have any lab test that is abnormal or we need to change your treatment, we will call you to review the results.  Testing/Procedures: Will be schedule at  [place] has requested that you have an echocardiogram. Echocardiography is a painless test that uses sound waves to create images of your heart. It provides your doctor with information about the size and shape of your heart and how well your heart's chambers and valves are working. This procedure takes approximately one hour. There are no restrictions for this procedure. And Your physician has recommended that you wear an event monitor 30 day. Event monitors are medical devices that record the heart's electrical activity. Doctors most often [place] these monitors to diagnose arrhythmias. Arrhythmias are problems with the speed or rhythm of the heartbeat. The monitor is a small, portable device. You can wear one while you do your normal daily activities. This is usually used to diagnose what is causing palpitations/syncope (passing out).     Will be schedule at  [place] has requested that you have a lexiscan myoview. For further information please visit [email or web address]. Please follow instruction sheet, as given.    Follow-Up: At [facility], you and your health needs are our priority.  As part of our continuing mission to provide you with exceptional heart care, we have created designated Provider Care Teams.  These Care Teams include your primary Cardiologist (physician) and Advanced Practice Providers  (APPs -  Physician Assistants and Nurse Practitioners) who all work together to provide you with the care you need, when you need it.  Your next appointment:   2 month(s)  The format for your next appointment:   In Person  Provider:   Glenetta Hew, MD  Other Instruction  [employer] Cardiac Event Monitor Instructions Your physician has requested you wear your cardiac event monitor for ___30__ days, (1-30). [employer] may call or text to confirm a shipping address. The monitor will be sent to a land address via UPS. [employer] will not ship a monitor to a PO BOX. It typically takes 3-5 days to receive your monitor after it has been enrolled. [employer] will assist with USPS tracking if your package is delayed. The telephone number for [employer] is [phone]. Once you have received your monitor, please review the enclosed instructions. Instruction tutorials can also be viewed under help and settings on the enclosed cell phone. Your monitor has already been registered assigning a specific monitor serial # to you.  Applying the monitor Remove cell phone from case and turn it on. The cell phone works as Dealer and needs to be within [employer] of you at all times. The cell phone will need to be charged on a daily basis. We recommend you plug the cell phone into the enclosed charger at your bedside table every night.  Monitor batteries: You will receive two monitor batteries labelled #1 and #2. These are your recorders. Plug battery #2 onto the second connection on the enclosed charger. Keep one battery on the charger at all times. This will keep the  monitor battery deactivated. It will also keep it fully charged for when you need to switch your monitor batteries. A small light will be blinking on the battery emblem when it is charging. The light on the battery emblem will remain on when the battery is fully charged.  Open package of a Monitor strip. Insert battery #1 into  black hood on strip and gently squeeze monitor battery onto connection as indicated in instruction booklet. Set aside while preparing skin.  Choose location for your strip, vertical or horizontal, as indicated in the instruction booklet. Shave to remove all hair from location. There cannot be any lotions, oils, powders, or colognes on skin where monitor is to be applied. Wipe skin clean with enclosed Saline wipe. Dry skin completely.  Peel paper labeled #1 off the back of the Monitor strip exposing the adhesive. Place the monitor on the chest in the vertical or horizontal position shown in the instruction booklet. One arrow on the monitor strip must be pointing upward. Carefully remove paper labeled #2, attaching remainder of strip to your skin. Try not to create any folds or wrinkles in the strip as you apply it.  Firmly press and release the circle in the center of the monitor battery. You will hear a small beep. This is turning the monitor battery on. The heart emblem on the monitor battery will light up every 5 seconds if the monitor battery in turned on and connected to the patient securely. Do not push and hold the circle down as this turns the monitor battery off. The cell phone will locate the monitor battery. A screen will appear on the cell phone checking the connection of your monitor strip. This may read poor connection initially but change to good connection within the next minute. Once your monitor accepts the connection you will hear a series of 3 beeps followed by a climbing crescendo of beeps. A screen will appear on the cell phone showing the two monitor strip placement options. Touch the picture that demonstrates where you applied the monitor strip.  Your monitor strip and battery are waterproof. You are able to shower, bathe, or swim with the monitor on. They just ask you do not submerge deeper than 3 feet underwater. We recommend removing the monitor if you are swimming in  a lake, river, or ocean.  Your monitor battery will need to be switched to a fully charged monitor battery approximately once a week. The cell phone will alert you of an action which needs to be made.  On the cell phone, tap for details to reveal connection status, monitor battery status, and cell phone battery status. The green dots indicates your monitor is in good status. A red dot indicates there is something that needs your attention.  To record a symptom, click the circle on the monitor battery. In 30-60 seconds a list of symptoms will appear on the cell phone. Select your symptom and tap save. Your monitor will record a sustained or significant arrhythmia regardless of you clicking the button. Some patients do not feel the heart rhythm irregularities. [employer] will notify us of any serious or critical events.  Refer to instruction booklet for instructions on switching batteries, changing strips, the Do not disturb or Pause features, or any additional questions.  Call [employer] at [phone], to confirm your monitor is transmitting and record your baseline. They will answer any questions you may have regarding the monitor instructions at that time.  Returning the monitor to [place] all  equipment back into blue box. Peel off strip of paper to expose adhesive and close box securely. There is a prepaid UPS shipping label on this box. Drop in a UPS drop box, or at a UPS facility like [employer]. You may also contact [employer] to arrange UPS to pick up monitor package at your home.

## 2019-10-29 ENCOUNTER — Other Ambulatory Visit: Payer: PPO

## 2019-10-29 ENCOUNTER — Ambulatory Visit (INDEPENDENT_AMBULATORY_CARE_PROVIDER_SITE_OTHER): Payer: PPO

## 2019-10-29 DIAGNOSIS — Z0000 Encounter for general adult medical examination without abnormal findings: Secondary | ICD-10-CM

## 2019-10-29 NOTE — Progress Notes (Signed)
Subjective:   AUTHOR SLAVEN is a 68 y.o. male who presents for an Initial Medicare Annual Wellness Visit.  Review of Systems: N/A    This visit is being conducted through telemedicine via telephone at the nurse health advisor's home address due to the COVID-19 pandemic. This patient has given me verbal consent via doximity to conduct this visit, patient states they are participating from their home address. Patient and myself are on the telephone call. There is no referral for this visit. Some vital signs may be absent or patient reported.    Patient identification: identified by name, DOB, and current address   Cardiac Risk Factors include: advanced age (>42men, >78 women);male gender;dyslipidemia;hypertension    Objective:    Today's Vitals   There is no height or weight on file to calculate BMI.  Advanced Directives 10/29/2019 04/22/2015 04/08/2015  Does Patient Have a Medical Advance Directive? Yes Yes Yes  Type of Paramedic of [place];Living will Healthcare Power of [place];Living will  Copy of [place] in Chart? No - copy requested - -    Current Medications (verified) Outpatient Encounter Medications as of 10/29/2019  Medication Sig  . aspirin 81 MG tablet Take 81 mg by mouth daily.  . fish oil-omega-3 fatty acids 1000 MG capsule Take 1 g by mouth daily.  Marland Kitchen losartan (COZAAR) 25 MG tablet Take 1 tablet (25 mg total) by mouth daily. For blood pressure.  . Multiple Vitamins-Minerals (MULTIVITAMIN PO) Take 1 Centrum Silver multivitamin by mouth daily  . simvastatin (ZOCOR) 20 MG tablet TAKE 1 TABLET BY MOUTH  EVERY EVENING for cholesterol.  . triamcinolone cream (KENALOG) 0.1 % Apply 1 application topically 2 (two) times daily as needed.   No facility-administered encounter medications on file as of 10/29/2019.     Allergies (verified) Patient has no known allergies.   History: Past Medical  History:  Diagnosis Date  . Adenomatous colon polyp    last colonoscopy was 3 years ago.  . Anemia   . Atrial Fibrillation    Stress ECHO-Negative (09/22/98)  . Diverticulosis   . Hemorrhoids summer 2005   s/p ligation of internal hemorrhoids  . HTN (hypertension)   . Hyperlipidemia   . Inguinal hernia   . [facility] spotted fever) July 2005   Past Surgical History:  Procedure Laterality Date  . cardiolite study  2004   negative  . COLONOSCOPY  2013  . [place]  2005  . INGUINAL HERNIA REPAIR Right 2019  . THROAT SURGERY  childhood  . VASECTOMY     Family History  Problem Relation Age of Onset  . Coronary artery disease Father 38       s/p MI in earlly 70s  . Heart disease Father   . Parkinson's disease Father   . Colon polyps Father   . Hypertension Brother   . Heart disease Mother   . Hypertension Mother   . Arthritis Mother   . Cancer Maternal Grandmother   . Cancer Maternal Grandfather   . Heart attack Paternal Grandfather   . Rectal cancer Neg Hx   . Stomach cancer Neg Hx   . Colon cancer Neg Hx   . Esophageal cancer Neg Hx    Social History   Socioeconomic History  . Marital status: Married    Spouse name: Not on file  . Number of children: Not on file  . Years of education: Not on file  .  Highest education level: Not on file  Occupational History  . Not on file  Social Needs  . Financial resource strain: Not hard at all  . Food insecurity    Worry: Never true    Inability: Never true  . Transportation needs    Medical: No    Non-medical: No  Tobacco Use  . Smoking status: Former Smoker    Packs/day: 1.00    Types: Cigarettes  . Smokeless tobacco: Former Systems developer    Quit date: 02/19/1998  Substance and Sexual Activity  . Alcohol use: Yes    Alcohol/week: 2.0 standard drinks    Types: 2 Glasses of wine per week    Comment: weekly  . Drug use: No  . Sexual activity: Yes    Birth control/protection: Surgical  Lifestyle  .  Physical activity    Days per week: 7 days    Minutes per session: 30 min  . Stress: Not at all  Relationships  . Social Herbalist on phone: Not on file    Gets together: Not on file    Attends religious service: Not on file    Active member of club or organization: Not on file    Attends meetings of clubs or organizations: Not on file    Relationship status: Not on file  Other Topics Concern  . Not on file  Social History Narrative   From [place].   Works at [employer] in [employer].   Married.   2 children: 39, 27. Daughters live in [place] and [place].   Enjoys golfing, visiting vineyards, traveling.   Tobacco Counseling Counseling given: Not Answered   Clinical Intake:  Pre-visit preparation completed: Yes  Pain : No/denies pain     Nutritional Risks: None Diabetes: No  How often do you need to have someone help you when you read instructions, pamphlets, or other written materials from your doctor or pharmacy?: 1 - Never What is the last grade level you completed in school?: bachelors  Interpreter Needed?: No  Information entered by :: CJohnson, LPN  Activities of Daily Living In your present state of health, do you have any difficulty performing the following activities: 10/29/2019  Hearing? N  Vision? N  Difficulty concentrating or making decisions? N  Walking or climbing stairs? N  Dressing or bathing? N  Doing errands, shopping? N  Preparing Food and eating ? N  Using the Toilet? N  In the past six months, have you accidently leaked urine? N  Do you have problems with loss of bowel control? N  Managing your Medications? N  Managing your Finances? N  Housekeeping or managing your Housekeeping? N  Some recent data might be hidden     Immunizations and Health Maintenance Immunization History  Administered Date(s) Administered  . Influenza Split 12/01/2011  . Influenza,inj,Quad PF,6+ Mos 10/07/2016, 08/25/2017, 08/23/2018,  08/29/2019  . Influenza-Unspecified 08/30/2014, 08/21/2015  . Pneumococcal Conjugate-13 10/01/2018  . Tdap 10/16/2007   Health Maintenance Due  Topic Date Due  . Hepatitis C Screening  Apr 17, 1951  . PNA vac Low Risk Adult (2 of 2 - PPSV23) 10/02/2019    Patient Care Team: Pleas Koch, NP as PCP - General (Nurse Practitioner) Gatha Mayer, MD (Gastroenterology) Romeo Apple, MD (Inactive) (Cardiology)  Indicate any recent Medical Services you may have received from other than [facility] providers in the past year (date may be approximate).    Assessment:   This is a routine  wellness examination for [place].  Hearing/Vision screen  Hearing Screening   125Hz  250Hz  500Hz  1000Hz  2000Hz  3000Hz  4000Hz  6000Hz  8000Hz   Right ear:           Left ear:           Vision Screening Comments: Patient gets annual eye exams   Dietary issues and exercise activities discussed: Current Exercise Habits: Home exercise routine, Type of exercise: walking, Time (Minutes): 30, Frequency (Times/Week): 7, Weekly Exercise (Minutes/Week): 210, Intensity: Moderate, Exercise limited by: None identified  Goals    . Patient Stated     10/29/2019, I will maintain and continue medications as prescribed.       Depression Screen PHQ 2/9 Scores 10/29/2019 10/01/2018 08/30/2017  PHQ - 2 Score 0 0 0  PHQ- 9 Score 0 - -    Fall Risk Fall Risk  10/29/2019 10/01/2018 08/30/2017  Falls in the past year? 0 0 No  Number falls in past yr: 0 - -  Injury with Fall? 0 - -  Risk for fall due to : Medication side effect - -  Follow up Falls evaluation completed;Falls prevention discussed - -     Is the patient's home free of loose throw rugs in walkways, pet beds, electrical cords, etc?   yes      Grab bars in the bathroom? no      Handrails on the stairs?   yes      Adequate lighting?   yes  Timed Get Up and Go performed: N/A  Cognitive Function: MMSE - Mini Mental State Exam 10/29/2019  Orientation to  time 5  Orientation to Place 5  Registration 3  Attention/ Calculation 5  Recall 3  Language- repeat 1       Mini Cog  Mini-Cog screen was completed. Maximum score is 22. A value of 0 denotes this part of the MMSE was not completed or the patient failed this part of the Mini-Cog screening.  Screening Tests Health Maintenance  Topic Date Due  . Hepatitis C Screening  Jul 25, 1951  . PNA vac Low Risk Adult (2 of 2 - PPSV23) 10/02/2019  . TETANUS/TDAP  10/28/2020 (Originally 10/15/2017)  . COLONOSCOPY  04/21/2020  . INFLUENZA VACCINE  Completed    Qualifies for Shingles Vaccine? Yes  Cancer Screenings: Lung: Low Dose CT Chest recommended if Age 43-80 years, 30 pack-year currently smoking OR have quit w/in 15years. Patient does not qualify. Colorectal: completed 04/22/2015  Additional Screenings:  Hepatitis C Screening: due      Plan:    Patient will maintain and continue medications as prescribed.  I have personally reviewed and noted the following in the patient's chart:   . Medical and social history . Use of alcohol, tobacco or illicit drugs  . Current medications and supplements . Functional ability and status . Nutritional status . Physical activity . Advanced directives . List of other physicians . Hospitalizations, surgeries, and ER visits in previous 12 months . Vitals . Screenings to include cognitive, depression, and falls . Referrals and appointments  In addition, I have reviewed and discussed with patient certain preventive protocols, quality metrics, and best practice recommendations. A written personalized care plan for preventive services as well as general preventive health recommendations were provided to patient.     Andrez Grime, LPN   [phone]

## 2019-10-29 NOTE — Progress Notes (Signed)
PCP notes:  Health Maintenance: Needs pneumovax 23 Shingrix- patient will check with insurance   Abnormal Screenings: none   Patient concerns: Patient has been experiencing some chest tightness. Has follow up scheduled with cardiology   Nurse concerns: none   Next PCP appt.: 11/06/2019 @ 9:20 am

## 2019-10-29 NOTE — Patient Instructions (Signed)
Alec Wilson , Thank you for taking time to come for your Medicare Wellness Visit. I appreciate your ongoing commitment to your health goals. Please review the following plan we discussed and let me know if I can assist you in the future.   Screening recommendations/referrals: Colonoscopy: Up to date, completed 04/22/2015 Recommended yearly ophthalmology/optometry visit for glaucoma screening and checkup Recommended yearly dental visit for hygiene and checkup  Vaccinations: Influenza vaccine: Up to date, completed 08/29/2019 Pneumococcal vaccine: will get at physical Tdap vaccine: decline-insurance Shingles vaccine: will check with insurance    Advanced directives: Please bring a copy of your POA (Power of [place]) and/or Living Will to your next appointment.   Conditions/risks identified: hypertension, hyperlipidemia  Next appointment: 11/05/2019 @ 2:20 pm   Preventive Care 65 Years and Older, Male Preventive care refers to lifestyle choices and visits with your health care provider that can promote health and wellness. What does preventive care include?  A yearly physical exam. This is also called an annual well check.  Dental exams once or twice a year.  Routine eye exams. Ask your health care provider how often you should have your eyes checked.  Personal lifestyle choices, including:  Daily care of your teeth and gums.  Regular physical activity.  Eating a healthy diet.  Avoiding tobacco and drug use.  Limiting alcohol use.  Practicing safe sex.  Taking low doses of aspirin every day.  Taking vitamin and mineral supplements as recommended by your health care provider. What happens during an annual well check? The services and screenings done by your health care provider during your annual well check will depend on your age, overall health, lifestyle risk factors, and family history of disease. Counseling  Your health care provider may ask you questions about your:   Alcohol use.  Tobacco use.  Drug use.  Emotional well-being.  Home and relationship well-being.  Sexual activity.  Eating habits.  History of falls.  Memory and ability to understand (cognition).  Work and work Statistician. Screening  You may have the following tests or measurements:  Height, weight, and BMI.  Blood pressure.  Lipid and cholesterol levels. These may be checked every 5 years, or more frequently if you are over 28 years old.  Skin check.  Lung cancer screening. You may have this screening every year starting at age 25 if you have a 30-pack-year history of smoking and currently smoke or have quit within the past 15 years.  Fecal occult blood test (FOBT) of the stool. You may have this test every year starting at age 53.  Flexible sigmoidoscopy or colonoscopy. You may have a sigmoidoscopy every 5 years or a colonoscopy every 10 years starting at age 48.  Prostate cancer screening. Recommendations will vary depending on your family history and other risks.  Hepatitis C blood test.  Hepatitis B blood test.  Sexually transmitted disease (STD) testing.  Diabetes screening. This is done by checking your blood sugar (glucose) after you have not eaten for a while (fasting). You may have this done every 1-3 years.  Abdominal aortic aneurysm (AAA) screening. You may need this if you are a current or former smoker.  Osteoporosis. You may be screened starting at age 35 if you are at high risk. Talk with your health care provider about your test results, treatment options, and if necessary, the need for more tests. Vaccines  Your health care provider may recommend certain vaccines, such as:  Influenza vaccine. This is recommended every year.  Tetanus, diphtheria, and acellular pertussis (Tdap, Td) vaccine. You may need a Td booster every 10 years.  Zoster vaccine. You may need this after age 31.  Pneumococcal 13-valent conjugate (PCV13) vaccine. One dose is  recommended after age 15.  Pneumococcal polysaccharide (PPSV23) vaccine. One dose is recommended after age 79. Talk to your health care provider about which screenings and vaccines you need and how often you need them. This information is not intended to replace advice given to you by your health care provider. Make sure you discuss any questions you have with your health care provider. Document Released: 12/04/2015 Document Revised: 07/27/2016 Document Reviewed: 09/08/2015 Elsevier Interactive Patient Education  2017 [place] Prevention in the Home Falls can cause injuries. They can happen to people of all ages. There are many things you can do to make your home safe and to help prevent falls. What can I do on the outside of my home?  Regularly fix the edges of walkways and driveways and fix any cracks.  Remove anything that might make you trip as you walk through a door, such as a raised step or threshold.  Trim any bushes or trees on the path to your home.  Use bright outdoor lighting.  Clear any walking paths of anything that might make someone trip, such as rocks or tools.  Regularly check to see if handrails are loose or broken. Make sure that both sides of any steps have handrails.  Any raised decks and porches should have guardrails on the edges.  Have any leaves, snow, or ice cleared regularly.  Use sand or salt on walking paths during winter.  Clean up any spills in your garage right away. This includes oil or grease spills. What can I do in the bathroom?  Use night lights.  Install grab bars by the toilet and in the tub and shower. Do not use towel bars as grab bars.  Use non-skid mats or decals in the tub or shower.  If you need to sit down in the shower, use a plastic, non-slip stool.  Keep the floor dry. Clean up any water that spills on the floor as soon as it happens.  Remove soap buildup in the tub or shower regularly.  Attach bath mats  securely with double-sided non-slip rug tape.  Do not have throw rugs and other things on the floor that can make you trip. What can I do in the bedroom?  Use night lights.  Make sure that you have a light by your bed that is easy to reach.  Do not use any sheets or blankets that are too big for your bed. They should not hang down onto the floor.  Have a firm chair that has side arms. You can use this for support while you get dressed.  Do not have throw rugs and other things on the floor that can make you trip. What can I do in the kitchen?  Clean up any spills right away.  Avoid walking on wet floors.  Keep items that you use a lot in easy-to-reach places.  If you need to reach something above you, use a strong step stool that has a grab bar.  Keep electrical cords out of the way.  Do not use floor polish or wax that makes floors slippery. If you must use wax, use non-skid floor wax.  Do not have throw rugs and other things on the floor that can make you trip. What can I do  with my stairs?  Do not leave any items on the stairs.  Make sure that there are handrails on both sides of the stairs and use them. Fix handrails that are broken or loose. Make sure that handrails are as long as the stairways.  Check any carpeting to make sure that it is firmly attached to the stairs. Fix any carpet that is loose or worn.  Avoid having throw rugs at the top or bottom of the stairs. If you do have throw rugs, attach them to the floor with carpet tape.  Make sure that you have a light switch at the top of the stairs and the bottom of the stairs. If you do not have them, ask someone to add them for you. What else can I do to help prevent falls?  Wear shoes that:  Do not have high heels.  Have rubber bottoms.  Are comfortable and fit you well.  Are closed at the toe. Do not wear sandals.  If you use a stepladder:  Make sure that it is fully opened. Do not climb a closed  stepladder.  Make sure that both sides of the stepladder are locked into place.  Ask someone to hold it for you, if possible.  Clearly mark and make sure that you can see:  Any grab bars or handrails.  First and last steps.  Where the edge of each step is.  Use tools that help you move around (mobility aids) if they are needed. These include:  Canes.  Walkers.  Scooters.  Crutches.  Turn on the lights when you go into a dark area. Replace any light bulbs as soon as they burn out.  Set up your furniture so you have a clear path. Avoid moving your furniture around.  If any of your floors are uneven, fix them.  If there are any pets around you, be aware of where they are.  Review your medicines with your doctor. Some medicines can make you feel dizzy. This can increase your chance of falling. Ask your doctor what other things that you can do to help prevent falls. This information is not intended to replace advice given to you by your health care provider. Make sure you discuss any questions you have with your health care provider. Document Released: 09/03/2009 Document Revised: 04/14/2016 Document Reviewed: 12/12/2014 Elsevier Interactive Patient Education  2017 [employer].

## 2019-10-31 ENCOUNTER — Encounter: Payer: Self-pay | Admitting: Cardiology

## 2019-10-31 NOTE — Assessment & Plan Note (Addendum)
Blood pressure borderline today.  Is on losartan at low-dose.  Reassess in follow-up visit, may consider increasing depending on what the stress test and echo demonstrate.

## 2019-10-31 NOTE — Assessment & Plan Note (Signed)
Reported history of mitral prolapse now with palpitations.  Very difficult to assess PA exam  Plan: Check 2D echo.

## 2019-10-31 NOTE — Assessment & Plan Note (Signed)
Some typical and otherwise atypical features for chest discomfort.  It is hard to tell if it really is exertional or not.  Occurs both with and without exertion.  However he does have a significant family history.   Plan: Check Lexiscan Myoview.

## 2019-10-31 NOTE — Assessment & Plan Note (Signed)
Lipids actually pretty well controlled on low-dose simvastatin.  Depending on what follow-up evaluation showed, may or may not be at goal.

## 2019-10-31 NOTE — Assessment & Plan Note (Signed)
Apparently diagnosed back in 2004.  As far as I can tell, no recurrence but he is now having significant palpitation episodes.  Plan: 30-day cardiac event monitor and 2D echo.

## 2019-10-31 NOTE — Assessment & Plan Note (Signed)
Sounds concerning for possible angina.  Will evaluate with Myoview.

## 2019-11-02 ENCOUNTER — Encounter (INDEPENDENT_AMBULATORY_CARE_PROVIDER_SITE_OTHER): Payer: PPO

## 2019-11-02 DIAGNOSIS — R011 Cardiac murmur, unspecified: Secondary | ICD-10-CM

## 2019-11-02 DIAGNOSIS — I4891 Unspecified atrial fibrillation: Secondary | ICD-10-CM

## 2019-11-05 ENCOUNTER — Other Ambulatory Visit: Payer: PPO

## 2019-11-05 ENCOUNTER — Ambulatory Visit: Payer: PPO | Admitting: Primary Care

## 2019-11-05 ENCOUNTER — Ambulatory Visit: Payer: PPO

## 2019-11-06 ENCOUNTER — Encounter: Payer: Self-pay | Admitting: Primary Care

## 2019-11-06 ENCOUNTER — Other Ambulatory Visit: Payer: Self-pay

## 2019-11-06 ENCOUNTER — Ambulatory Visit (INDEPENDENT_AMBULATORY_CARE_PROVIDER_SITE_OTHER): Payer: PPO | Admitting: Primary Care

## 2019-11-06 VITALS — BP 126/76 | HR 60 | Temp 96.4°F | Ht 68.0 in | Wt 182.2 lb

## 2019-11-06 DIAGNOSIS — I10 Essential (primary) hypertension: Secondary | ICD-10-CM | POA: Diagnosis not present

## 2019-11-06 DIAGNOSIS — Z86010 Personal history of colonic polyps: Secondary | ICD-10-CM | POA: Diagnosis not present

## 2019-11-06 DIAGNOSIS — E785 Hyperlipidemia, unspecified: Secondary | ICD-10-CM

## 2019-11-06 DIAGNOSIS — Z0000 Encounter for general adult medical examination without abnormal findings: Secondary | ICD-10-CM | POA: Diagnosis not present

## 2019-11-06 DIAGNOSIS — R0789 Other chest pain: Secondary | ICD-10-CM | POA: Diagnosis not present

## 2019-11-06 DIAGNOSIS — R011 Cardiac murmur, unspecified: Secondary | ICD-10-CM | POA: Diagnosis not present

## 2019-11-06 DIAGNOSIS — R079 Chest pain, unspecified: Secondary | ICD-10-CM

## 2019-11-06 DIAGNOSIS — M1990 Unspecified osteoarthritis, unspecified site: Secondary | ICD-10-CM | POA: Diagnosis not present

## 2019-11-06 DIAGNOSIS — Z23 Encounter for immunization: Secondary | ICD-10-CM

## 2019-11-06 DIAGNOSIS — I341 Nonrheumatic mitral (valve) prolapse: Secondary | ICD-10-CM

## 2019-11-06 MED ORDER — ZOSTER VAC RECOMB ADJUVANTED 50 MCG/0.5ML IM SUSR: 0.5000 mL | Freq: Once | INTRAMUSCULAR | 1 refills | Status: AC

## 2019-11-06 MED ORDER — LOSARTAN POTASSIUM 25 MG PO TABS: 25.0000 mg | ORAL_TABLET | Freq: Every day | ORAL | 3 refills | Status: DC

## 2019-11-06 MED ORDER — SIMVASTATIN 20 MG PO TABS: ORAL_TABLET | ORAL | 3 refills | Status: DC

## 2019-11-06 NOTE — Assessment & Plan Note (Signed)
Repeat colonoscopy due in Summer 2021.

## 2019-11-06 NOTE — Patient Instructions (Addendum)
Take the shingles vaccination to the pharmacy after one month.  Continue exercising. You should be getting 150 minutes of moderate intensity exercise weekly.  Continue to work on a healthy diet.  Follow up with Dr. Ellyn Hack as scheduled.  It was a pleasure to see you today!   Preventive Care 67 Years and Older, Male Preventive care refers to lifestyle choices and visits with your health care provider that can promote health and wellness. This includes:  A yearly physical exam. This is also called an annual well check.  Regular dental and eye exams.  Immunizations.  Screening for certain conditions.  Healthy lifestyle choices, such as diet and exercise. What can I expect for my preventive care visit? Physical exam Your health care provider will check:  Height and weight. These may be used to calculate body mass index (BMI), which is a measurement that tells if you are at a healthy weight.  Heart rate and blood pressure.  Your skin for abnormal spots. Counseling Your health care provider may ask you questions about:  Alcohol, tobacco, and drug use.  Emotional well-being.  Home and relationship well-being.  Sexual activity.  Eating habits.  History of falls.  Memory and ability to understand (cognition).  Work and work Statistician. What immunizations do I need?  Influenza (flu) vaccine  This is recommended every year. Tetanus, diphtheria, and pertussis (Tdap) vaccine  You may need a Td booster every 10 years. Varicella (chickenpox) vaccine  You may need this vaccine if you have not already been vaccinated. Zoster (shingles) vaccine  You may need this after age 64. Pneumococcal conjugate (PCV13) vaccine  One dose is recommended after age 70. Pneumococcal polysaccharide (PPSV23) vaccine  One dose is recommended after age 49. Measles, mumps, and rubella (MMR) vaccine  You may need at least one dose of MMR if you were born in 1957 or later. You may also  need a second dose. Meningococcal conjugate (MenACWY) vaccine  You may need this if you have certain conditions. Hepatitis A vaccine  You may need this if you have certain conditions or if you travel or work in places where you may be exposed to hepatitis A. Hepatitis B vaccine  You may need this if you have certain conditions or if you travel or work in places where you may be exposed to hepatitis B. Haemophilus influenzae type b (Hib) vaccine  You may need this if you have certain conditions. You may receive vaccines as individual doses or as more than one vaccine together in one shot (combination vaccines). Talk with your health care provider about the risks and benefits of combination vaccines. What tests do I need? Blood tests  Lipid and cholesterol levels. These may be checked every 5 years, or more frequently depending on your overall health.  Hepatitis C test.  Hepatitis B test. Screening  Lung cancer screening. You may have this screening every year starting at age 81 if you have a 30-pack-year history of smoking and currently smoke or have quit within the past 15 years.  Colorectal cancer screening. All adults should have this screening starting at age 50 and continuing until age 54. Your health care provider may recommend screening at age 48 if you are at increased risk. You will have tests every 1-10 years, depending on your results and the type of screening test.  Prostate cancer screening. Recommendations will vary depending on your family history and other risks.  Diabetes screening. This is done by checking your blood sugar (glucose)  after you have not eaten for a while (fasting). You may have this done every 1-3 years.  Abdominal aortic aneurysm (AAA) screening. You may need this if you are a current or former smoker.  Sexually transmitted disease (STD) testing. Follow these instructions at home: Eating and drinking  Eat a diet that includes fresh fruits and  vegetables, whole grains, lean protein, and low-fat dairy products. Limit your intake of foods with high amounts of sugar, saturated fats, and salt.  Take vitamin and mineral supplements as recommended by your health care provider.  Do not drink alcohol if your health care provider tells you not to drink.  If you drink alcohol: ? Limit how much you have to 0-2 drinks a day. ? Be aware of how much alcohol is in your drink. In the [place] one drink equals one 12 oz bottle of beer (355 mL), one 5 oz glass of wine (148 mL), or one 1 oz glass of hard liquor (44 mL). Lifestyle  Take daily care of your teeth and gums.  Stay active. Exercise for at least 30 minutes on 5 or more days each week.  Do not use any products that contain nicotine or tobacco, such as cigarettes, e-cigarettes, and chewing tobacco. If you need help quitting, ask your health care provider.  If you are sexually active, practice safe sex. Use a condom or other form of protection to prevent STIs (sexually transmitted infections).  Talk with your health care provider about taking a low-dose aspirin or statin. What's next?  Visit your health care provider once a year for a well check visit.  Ask your health care provider how often you should have your eyes and teeth checked.  Stay up to date on all vaccines. This information is not intended to replace advice given to you by your health care provider. Make sure you discuss any questions you have with your health care provider. Document Released: 12/04/2015 Document Revised: 11/01/2018 Document Reviewed: 11/01/2018 Elsevier Patient Education  2020 [employer].

## 2019-11-06 NOTE — Assessment & Plan Note (Signed)
LDL stable on Simvastatin, continue same.

## 2019-11-06 NOTE — Assessment & Plan Note (Signed)
Stable in the office today, continue losartan. Refill sent to pharmacy. CMP from November 2020 reviewed.

## 2019-11-06 NOTE — Progress Notes (Signed)
Subjective:    Patient ID: Alec Wilson, male    DOB: [birth year], 68 y.o.   MRN: [id number]  HPI  Alec Wilson is a 68 year old male who presents today for complete physical.  Immunizations: -Tetanus: Completed in 2008 -Influenza: Completed this season  -Shingles: Never completed -Pneumonia: Completed Prevnar in 2019, due for Pneumovax  Diet: He endorses a healthy diet.  Exercise: He is playing golf, walking some.  Eye exam: Completed in 2020 Dental exam: Completes annually   Colonoscopy: Completed in 2016, due in 2021 PSA: 1.56 in 2020 Hep C Screen: Due  BP Readings from Last 3 Encounters:  11/06/19 126/76  10/28/19 138/88  10/07/19 (!) 144/80    Wt Readings from Last 3 Encounters:  11/06/19 182 lb 4 oz (82.7 kg)  10/28/19 181 lb (82.1 kg)  10/07/19 179 lb 4 oz (81.3 kg)     Review of Systems  Constitutional: Negative for unexpected weight change.  HENT: Negative for rhinorrhea.   Eyes: Negative for visual disturbance.  Respiratory: Negative for cough and shortness of breath.   Cardiovascular: Negative for chest pain.       Intermittent chest pressure, seeing cardiology  Gastrointestinal: Negative for constipation and diarrhea.  Genitourinary: Negative for difficulty urinating.  Musculoskeletal: Negative for arthralgias.  Skin: Negative for rash.  Allergic/Immunologic: Negative for environmental allergies.  Neurological: Negative for numbness and headaches.       Intermittent lightheadedness   Psychiatric/Behavioral: The patient is not nervous/anxious.        Past Medical History:  Diagnosis Date  . Adenomatous colon polyp    last colonoscopy was 3 years ago.  . Anemia   . Diverticulosis   . Hemorrhoids summer 2005   s/p ligation of internal hemorrhoids  . HTN (hypertension)   . Hyperlipidemia   . Inguinal hernia   . PAF (paroxysmal atrial fibrillation) ([place]) 1999   Stress ECHO-Negative (09/22/98)  . RMSF [facility] spotted fever) July 2005      Social History   Socioeconomic History  . Marital status: Married    Spouse name: Not on file  . Number of children: Not on file  . Years of education: Not on file  . Highest education level: Not on file  Occupational History  . Not on file  Tobacco Use  . Smoking status: Former Smoker    Packs/day: 1.00    Types: Cigarettes  . Smokeless tobacco: Former Systems developer    Quit date: 02/19/1998  Substance and Sexual Activity  . Alcohol use: Yes    Alcohol/week: 2.0 standard drinks    Types: 2 Glasses of wine per week    Comment: weekly  . Drug use: No  . Sexual activity: Yes    Birth control/protection: Surgical  Other Topics Concern  . Not on file  Social History Narrative   From [place].   Works at [employer] in [employer].   Married.  2 children: 39, 27.    Daughters live in [place] and [place].   Enjoys golfing, visiting vineyards, traveling --> exercises 6 days a week 1 walking and playing golf      Quit smoking in 1999.   Drinks roughly 5 glasses of wine a week.   Social Determinants of Health   Financial Resource Strain: Low Risk   . Difficulty of Paying Living Expenses: Not hard at all  Food Insecurity: No Food Insecurity  . Worried About Charity fundraiser in the Last Year:  Never true  . Ran Out of Food in the Last Year: Never true  Transportation Needs: No Transportation Needs  . Lack of Transportation (Medical): No  . Lack of Transportation (Non-Medical): No  Physical Activity: Sufficiently Active  . Days of Exercise per Week: 7 days  . Minutes of Exercise per Session: 30 min  Stress: No Stress Concern Present  . Feeling of Stress : Not at all  Social Connections:   . Frequency of Communication with Friends and Family: Not on file  . Frequency of Social Gatherings with Friends and Family: Not on file  . Attends Religious Services: Not on file  . Active Member of Clubs or Organizations: Not on file  . Attends Archivist Meetings: Not  on file  . Marital Status: Not on file  Intimate Partner Violence: Not At Risk  . Fear of Current or Ex-Partner: No  . Emotionally Abused: No  . Physically Abused: No  . Sexually Abused: No    Past Surgical History:  Procedure Laterality Date  . cardiolite study  2004   negative  . COLONOSCOPY  2013  . [place]  2005  . INGUINAL HERNIA REPAIR Right 2019  . THROAT SURGERY  childhood  . VASECTOMY      Family History  Problem Relation Age of Onset  . Coronary artery disease Father 32       CABG at age 71, MI at 36.  . Parkinson's disease Father   . Colon polyps Father   . Congestive Heart Failure Father   . Hypertension Brother   . Hypertension Mother   . Arthritis Mother   . Heart attack Mother 63  . Cancer Maternal Grandmother   . Cancer Maternal Grandfather   . Heart attack Paternal Grandfather 8  . Rectal cancer Neg Hx   . Stomach cancer Neg Hx   . Colon cancer Neg Hx   . Esophageal cancer Neg Hx     No Known Allergies  Current Outpatient Medications on File Prior to Visit  Medication Sig Dispense Refill  . aspirin 81 MG tablet Take 81 mg by mouth daily.    . fish oil-omega-3 fatty acids 1000 MG capsule Take 1 g by mouth daily.    . Multiple Vitamins-Minerals (MULTIVITAMIN PO) Take 1 Centrum Silver multivitamin by mouth daily    . triamcinolone cream (KENALOG) 0.1 % Apply 1 application topically 2 (two) times daily as needed. 30 g 0   No current facility-administered medications on file prior to visit.    BP 126/76   Pulse 60   Temp (!) 96.4 F (35.8 C) (Temporal)   Ht 5\' 8"  (1.727 m)   Wt 182 lb 4 oz (82.7 kg)   SpO2 98%   BMI 27.71 kg/m    Objective:   Physical Exam  Constitutional: He is oriented to person, place, and time. He appears well-nourished.  HENT:  Right Ear: Tympanic membrane and ear canal normal.  Left Ear: Tympanic membrane and ear canal normal.  Mouth/Throat: Oropharynx is clear and moist.  Eyes: Pupils are equal,  round, and reactive to light. EOM are normal.  Cardiovascular: Normal rate and regular rhythm.  Respiratory: Effort normal and breath sounds normal.  GI: Soft. Bowel sounds are normal. There is no abdominal tenderness.  Musculoskeletal:        General: Normal range of motion.     Cervical back: Neck supple.  Neurological: He is alert and oriented to person, place, and time. No cranial  nerve deficit.  Reflex Scores:      Patellar reflexes are 2+ on the right side and 2+ on the left side. Skin: Skin is warm and dry.  Psychiatric: He has a normal mood and affect.           Assessment & Plan:

## 2019-11-06 NOTE — Assessment & Plan Note (Signed)
Following with cardiology, scheduled for echo and nuclear stress test. Wearing heart monitor now. Asymptomatic over the last 1 week.

## 2019-11-06 NOTE — Assessment & Plan Note (Signed)
Rate and rhythm regular today, Holter monitor in place. Following with cardiology.

## 2019-11-06 NOTE — Assessment & Plan Note (Signed)
Pneumovax due, provided. Rx for Shingrix provided. PSA UTD. Colonoscopy UTD, due in Summer 2021. Encourage regular exercise, healthy diet. Exam today benign. Labs reviewed from November 2020.

## 2019-11-06 NOTE — Assessment & Plan Note (Signed)
Overall stable, denies concerns today.

## 2019-11-06 NOTE — Assessment & Plan Note (Signed)
Recent lightheadedness with chest pressure, repeat echocardiogram pending. Following with cardiology.

## 2019-11-07 ENCOUNTER — Other Ambulatory Visit (HOSPITAL_COMMUNITY): Payer: PPO

## 2019-11-07 NOTE — Addendum Note (Signed)
Addended by: Jacqualin Combes on: 11/07/2019 10:25 AM   Modules accepted: Orders

## 2019-11-08 ENCOUNTER — Encounter (HOSPITAL_COMMUNITY): Payer: PPO

## 2019-11-22 HISTORY — PX: NM MYOVIEW LTD: HXRAD82

## 2019-11-28 ENCOUNTER — Telehealth (HOSPITAL_COMMUNITY): Payer: Self-pay

## 2019-11-28 NOTE — Telephone Encounter (Signed)
Encounter complete. 

## 2019-11-28 NOTE — Telephone Encounter (Signed)
Routed to [employer]

## 2019-11-28 NOTE — Telephone Encounter (Signed)
Patient returning call.

## 2019-12-03 ENCOUNTER — Other Ambulatory Visit: Payer: Self-pay

## 2019-12-03 ENCOUNTER — Ambulatory Visit (HOSPITAL_COMMUNITY)
Admission: RE | Admit: 2019-12-03 | Discharge: 2019-12-03 | Disposition: A | Discharge status: Home / Self Care | Payer: PPO | Source: Ambulatory Visit | Attending: Cardiovascular Disease | Admitting: Cardiovascular Disease

## 2019-12-03 DIAGNOSIS — R011 Cardiac murmur, unspecified: Secondary | ICD-10-CM | POA: Diagnosis not present

## 2019-12-03 DIAGNOSIS — I341 Nonrheumatic mitral (valve) prolapse: Secondary | ICD-10-CM

## 2019-12-03 DIAGNOSIS — R0789 Other chest pain: Secondary | ICD-10-CM

## 2019-12-03 DIAGNOSIS — R079 Chest pain, unspecified: Secondary | ICD-10-CM

## 2019-12-03 LAB — MYOCARDIAL PERFUSION IMAGING
LV dias vol: 87 mL (ref 62–150)
LV sys vol: 34 mL
Peak HR: 100 {beats}/min
Rest HR: 60 {beats}/min
SDS: 1
SRS: 2
SSS: 3
TID: 1.01

## 2019-12-03 MED ORDER — TECHNETIUM TC 99M TETROFOSMIN IV KIT
10.8000 | PACK | Freq: Once | INTRAVENOUS | Status: AC | PRN
  Administered 2019-12-03: 10.8 via INTRAVENOUS
  Filled 2019-12-03: qty 11

## 2019-12-03 MED ORDER — REGADENOSON 0.4 MG/5ML IV SOLN
0.4000 mg | Freq: Once | INTRAVENOUS | Status: AC
  Administered 2019-12-03: 0.4 mg via INTRAVENOUS

## 2019-12-03 MED ORDER — TECHNETIUM TC 99M TETROFOSMIN IV KIT
31.6000 | PACK | Freq: Once | INTRAVENOUS | Status: AC | PRN
  Administered 2019-12-03: 31.6 via INTRAVENOUS
  Filled 2019-12-03: qty 32

## 2019-12-04 ENCOUNTER — Ambulatory Visit (HOSPITAL_COMMUNITY): Payer: PPO | Attending: Cardiology

## 2019-12-04 DIAGNOSIS — I341 Nonrheumatic mitral (valve) prolapse: Secondary | ICD-10-CM | POA: Diagnosis not present

## 2019-12-04 DIAGNOSIS — R011 Cardiac murmur, unspecified: Secondary | ICD-10-CM | POA: Insufficient documentation

## 2019-12-04 DIAGNOSIS — R079 Chest pain, unspecified: Secondary | ICD-10-CM | POA: Diagnosis not present

## 2019-12-04 HISTORY — PX: TRANSTHORACIC ECHOCARDIOGRAM: SHX275

## 2019-12-06 ENCOUNTER — Other Ambulatory Visit: Payer: Self-pay | Admitting: Cardiology

## 2019-12-06 DIAGNOSIS — R011 Cardiac murmur, unspecified: Secondary | ICD-10-CM

## 2019-12-06 DIAGNOSIS — I4891 Unspecified atrial fibrillation: Secondary | ICD-10-CM

## 2019-12-22 ENCOUNTER — Ambulatory Visit: Payer: PPO

## 2019-12-27 ENCOUNTER — Ambulatory Visit: Payer: PPO | Attending: Internal Medicine

## 2019-12-27 DIAGNOSIS — Z23 Encounter for immunization: Secondary | ICD-10-CM

## 2019-12-27 NOTE — Progress Notes (Signed)
   Covid-19 Vaccination Clinic  Name:  BREXTYN LINWOOD    MRN: [id number] DOB: [birth year]  12/27/2019  Mr. Clarkson was observed post Covid-19 immunization for 15 minutes without incidence. He was provided with Vaccine Information Sheet and instruction to access the V-Safe system.   Mr. Sit was instructed to call 911 with any severe reactions post vaccine: Marland Kitchen Difficulty breathing  . Swelling of your face and throat  . A fast heartbeat  . A bad rash all over your body  . Dizziness and weakness    Immunizations Administered    Name Date Dose VIS Date Route   Pfizer COVID-19 Vaccine 12/27/2019 11:40 AM 0.3 mL 11/01/2019 Intramuscular   Manufacturer: [place]   Lot: [id number]   [place]: [id number]

## 2020-01-01 ENCOUNTER — Ambulatory Visit (INDEPENDENT_AMBULATORY_CARE_PROVIDER_SITE_OTHER): Payer: PPO | Admitting: Cardiology

## 2020-01-01 ENCOUNTER — Other Ambulatory Visit: Payer: Self-pay

## 2020-01-01 ENCOUNTER — Encounter: Payer: Self-pay | Admitting: Cardiology

## 2020-01-01 VITALS — BP 128/78 | HR 60 | Ht 69.0 in | Wt 184.0 lb

## 2020-01-01 DIAGNOSIS — R002 Palpitations: Secondary | ICD-10-CM | POA: Diagnosis not present

## 2020-01-01 DIAGNOSIS — R0789 Other chest pain: Secondary | ICD-10-CM

## 2020-01-01 DIAGNOSIS — R011 Cardiac murmur, unspecified: Secondary | ICD-10-CM

## 2020-01-01 DIAGNOSIS — I10 Essential (primary) hypertension: Secondary | ICD-10-CM

## 2020-01-01 DIAGNOSIS — I341 Nonrheumatic mitral (valve) prolapse: Secondary | ICD-10-CM

## 2020-01-01 NOTE — Patient Instructions (Signed)
Medication Instructions:  NONE *If you need a refill on your cardiac medications before your next appointment, please call your pharmacy*  Lab Work: NONE If you have labs (blood work) drawn today and your tests are completely normal, you will receive your results only by: Marland Kitchen MyChart Message (if you have MyChart) OR . A paper copy in the mail If you have any lab test that is abnormal or we need to change your treatment, we will call you to review the results.  Testing/Procedures: NONE  Follow-Up: At [facility], you and your health needs are our priority.  As part of our continuing mission to provide you with exceptional heart care, we have created designated Provider Care Teams.  These Care Teams include your primary Cardiologist (physician) and Advanced Practice Providers (APPs -  Physician Assistants and Nurse Practitioners) who all work together to provide you with the care you need, when you need it.  Your next appointment:   AS NEEDED  The format for your next appointment:   In Person  Provider:   Glenetta Hew, MD

## 2020-01-01 NOTE — Progress Notes (Signed)
Primary Care Provider: Pleas Koch, NP Cardiologist: No primary care provider on file.  New to me Electrophysiologist: n/a  Clinic Note: Chief Complaint  Patient presents with  . Follow-up    Test results;  Marland Kitchen Chest Pain    No further symptoms  . Palpitations    Notably improved    HPI:    Alec Wilson is a 69 y.o. male with a documented history of hypertension, hyperlipidemia and report atrial fibrillation who is being seen today for the follow-up evaluation of CHEST PRESSURE, ARRHYTHMIA after initially being seen at the request of Pleas Koch, NP.  Alec Wilson referred by Cornell Barman, NP after being seen with complaints of 1 week of chest pressure, feeling heart skipping, heartburn and dizziness.  Symptoms seem to be exacerbated by exertion.  Also noted dizziness with standing.  Aspirin had no relief.  Chest tightness not always associated with heart irregularity.  May also happen with lying down at night.  He was seen in consultation on October 28, 2019.  He had previously been seen by Dr. Doreatha Lew years ago for what was thought to be making mitral prolapse and perhaps atrial fibrillation, he is not sure.  To me noted the chest discomfort episodes and occasional exertional dyspnea as noted above.  Was evaluated with Myoview, echocardiogram and event monitor.  Recent Hospitalizations: N/A  Reviewed  CV studies:    The following studies were reviewed today: (if available, images/films reviewed: From Epic Chart or Care Everywhere) . Myoview December 03, 2019: EF 60%..  Medium size, mild severity fixed apical defect with normal wall motion consistent with artifact.  LOW Risk  . Echo December 04, 2019: EF 60 to 65%.  No R WMA.  Mild basal septal hypertrophy.  Normal RV function but mildly increased size.  Normal atrial sizes.  Mild aortic sclerosis, no stenosis.  . 30-day EVENT MONITOR (December 2020-January 2021): Baseline sample showed sinus rhythm with average  rate 79 bpm.  Minimum heart rate 48 bpm.  Maximum heart rate sinus tachycardia 159 bpm. <1% PVCs.  No A. fib.  Bradycardia was noted during sleeping hours.  Patient had one episode of sensation of fluttering or skipped beats during which she had a short run of sinus rhythm with PVCs in bigeminy.   Interval History:   Alec Wilson presents here for follow-up to discuss results of his studies.  He actually has not had any further episodes of chest pain and very rare palpitations since he came in.  CV Review of Symptoms (Summary): no chest pain or dyspnea on exertion positive for - irregular heartbeat, palpitations and Some mild lightheadedness or palpitations.  No further chest pain or pressure negative for - edema, loss of consciousness, orthopnea, paroxysmal nocturnal dyspnea, rapid heart rate, shortness of breath or Syncope/near syncope, TIA/amaurosis fugax  The patient DOES NOT have symptoms concerning for COVID-19 infection (fever, chills, cough, or new shortness of breath).  The patient is practicing social distancing and masking.   REVIEWED OF SYSTEMS   A comprehensive ROS was performed. Review of Systems  Constitutional: Negative for malaise/fatigue (Actually energy is getting better over the last month) and weight loss.  HENT: Negative for nosebleeds.   Respiratory: Positive for shortness of breath (Only exertional).   Cardiovascular: Chest pain: Per HPI.  Gastrointestinal: Negative for blood in stool and melena.  Genitourinary: Negative for hematuria.  Musculoskeletal: Positive for joint pain (Mild joint pains).  Neurological: Dizziness: Sometimes positional, sometimes with  palpitations.  Endo/Heme/Allergies: Negative for environmental allergies.   I have reviewed and (if needed) personally updated the patient's problem list, medications, allergies, past medical and surgical history, social and family history.   PAST MEDICAL HISTORY   Past Medical History:  Diagnosis Date    . Adenomatous colon polyp    last colonoscopy was 3 years ago.  . Anemia   . Diverticulosis   . Hemorrhoids summer 2005   s/p ligation of internal hemorrhoids  . HTN (hypertension)   . Hyperlipidemia   . Inguinal hernia   . PAF (paroxysmal atrial fibrillation) ([place]) 1999   Stress ECHO-Negative (09/22/98) -> no further documented evidence of A. fib  . RMSF [facility] spotted fever) July 2005     PAST SURGICAL HISTORY   Past Surgical History:  Procedure Laterality Date  . CARDIAC EVENT MONITOR  10/2019   Baseline sample showed sinus rhythm with average rate 79 bpm.  Minimum heart rate 48 bpm.  Maximum heart rate sinus tachycardia 159 bpm. <1% PVCs.  No A. fib.  Bradycardia was noted during sleeping hours.  Patient had one episode of sensation of fluttering or skipped beats during which she had a short run of sinus rhythm with PVCs in bigeminy.  . COLONOSCOPY  2013  . [place]  2005  . INGUINAL HERNIA REPAIR Right 2019  . NM MYOVIEW LTD  11/2019   EF 60%..  Medium size, mild severity fixed apical defect with normal wall motion consistent with artifact.  LOW Risk  . THROAT SURGERY  childhood  . TRANSTHORACIC ECHOCARDIOGRAM  12/04/2019    EF 60 to 65%.  No R WMA.  Mild basal septal hypertrophy.  Normal RV function but mildly increased size.  Normal atrial sizes.  Mild aortic sclerosis, no stenosis.  Marland Kitchen VASECTOMY      MEDICATIONS/ALLERGIES   Current Meds  Medication Sig  . aspirin 81 MG tablet Take 81 mg by mouth daily.  . fish oil-omega-3 fatty acids 1000 MG capsule Take 1 g by mouth daily.  Marland Kitchen losartan (COZAAR) 25 MG tablet Take 1 tablet (25 mg total) by mouth daily. For blood pressure.  . Multiple Vitamins-Minerals (MULTIVITAMIN PO) Take 1 Centrum Silver multivitamin by mouth daily  . simvastatin (ZOCOR) 20 MG tablet TAKE 1 TABLET BY MOUTH  EVERY EVENING for cholesterol.    No Known Allergies   SOCIAL HISTORY/FAMILY HISTORY   Social History   Tobacco Use   . Smoking status: Former Smoker    Packs/day: 1.00    Types: Cigarettes  . Smokeless tobacco: Former Systems developer    Quit date: 02/19/1998  Substance Use Topics  . Alcohol use: Yes    Alcohol/week: 2.0 standard drinks    Types: 2 Glasses of wine per week    Comment: weekly  . Drug use: No   Social History   Social History Narrative   From [place].   Works at [employer] in [employer].   Married.  2 children: 39, 27.    Daughters live in [place] and [place].   Enjoys golfing, visiting vineyards, traveling --> exercises 6 days a week 1 walking and playing golf      Quit smoking in 1999.   Drinks roughly 5 glasses of wine a week.    Family History family history includes Arthritis in his mother; Cancer in his maternal grandfather and maternal grandmother; Colon polyps in his father; Congestive Heart Failure in his father; Coronary artery disease (age of onset: 52)  in his father; Heart attack (age of onset: 35) in his paternal grandfather; Heart attack (age of onset: 72) in his mother; Hypertension in his brother and mother; Parkinson's disease in his father.   OBJCTIVE -PE, EKG, labs   Wt Readings from Last 3 Encounters:  01/01/20 184 lb (83.5 kg)  12/03/19 182 lb (82.6 kg)  11/06/19 182 lb 4 oz (82.7 kg)    Physical Exam: BP 128/78   Pulse 60   Ht 5\' 9"  (1.753 m)   Wt 184 lb (83.5 kg)   SpO2 97%   BMI 27.17 kg/m  Physical Exam  Constitutional: He is oriented to person, place, and time. He appears well-developed and well-nourished. No distress.  Healthy-appearing.  Well-groomed  HENT:  Head: Normocephalic and atraumatic.  Neck: No hepatojugular reflux and no JVD present. Carotid bruit is not present.  Cardiovascular: Normal rate, regular rhythm, normal heart sounds, intact distal pulses and normal pulses.  No extrasystoles are present. Exam reveals no gallop and no friction rub.  No murmur heard. Pulmonary/Chest: Effort normal and breath sounds normal. No  respiratory distress. He has no wheezes. He has no rales.  Abdominal: Bowel sounds are normal. There is no abdominal tenderness.  Musculoskeletal:        General: No edema. Normal range of motion.  Neurological: He is alert and oriented to person, place, and time.  Psychiatric: He has a normal mood and affect. His behavior is normal. Judgment and thought content normal.  Vitals reviewed.    Adult ECG Report -October 07, 2019 N/a  Recent Labs:    Lab Results  Component Value Date   CHOL 187 10/07/2019   HDL 81.00 10/07/2019   LDLCALC 82 10/07/2019   TRIG 120.0 10/07/2019   CHOLHDL 2 10/07/2019   Lab Results  Component Value Date   CREATININE 0.75 10/07/2019   BUN 17 10/07/2019   NA 139 10/07/2019   K 4.8 10/07/2019   CL 103 10/07/2019   CO2 28 10/07/2019    ASSESSMENT/PLAN   Problem List Items Addressed This Visit    Essential hypertension (Chronic)   Chest pressure - Primary (Chronic)    Normal echocardiogram and nonischemic Myoview. He had a fixed defect with no wall motion abnormality.  This is classic for artifact known as gut attenuation artifact.      Atrial Fibrillation    Monitor did not show any evidence of A. fib.  He did have some PVCs and bigeminy.  We talked about maybe potentially obtaining a personal home monitor - Jodelle Red that is associated with a smart phone application.  Unless we can capture A. fib, cannot confirm presence or absence of A. fib      Mitral valve prolapse    Inaccurate diagnosis.  Echocardiogram does not show any evidence of mitral prolapse.      Palpitations    Probably feeling PVCs.  What he noted his palpitations were PVCs with bigeminy.  This would potentially be irregularly irregular type of rhythm. His blood pressure seems pretty well controlled today, but if he needed more control, would consider a long-acting beta-blocker such as Toprol or nadolol.         COVID-19 Education: The signs and symptoms of COVID-19 were  discussed with the patient and how to seek care for testing (follow up with PCP or arrange E-visit).   The importance of social distancing was discussed today.  I spent a total of 14 minutes with the patient and chart review. >  50% of the time was spent in direct patient consultation.  Additional time spent with chart review (studies, outside notes, etc): 8 Total Time: 30min  Current medicines are reviewed at length with the patient today.  (+/- concerns) n/a   Patient Instructions / Medication Changes & Studies & Tests Ordered   Patient Instructions  Medication Instructions:  NONE *If you need a refill on your cardiac medications before your next appointment, please call your pharmacy*  Lab Work: NONE If you have labs (blood work) drawn today and your tests are completely normal, you will receive your results only by: Marland Kitchen MyChart Message (if you have MyChart) OR . A paper copy in the mail If you have any lab test that is abnormal or we need to change your treatment, we will call you to review the results.  Testing/Procedures: NONE  Follow-Up: At [facility], you and your health needs are our priority.  As part of our continuing mission to provide you with exceptional heart care, we have created designated Provider Care Teams.  These Care Teams include your primary Cardiologist (physician) and Advanced Practice Providers (APPs -  Physician Assistants and Nurse Practitioners) who all work together to provide you with the care you need, when you need it.  Your next appointment:   AS NEEDED  The format for your next appointment:   In Person  Provider:   Glenetta Hew, MD    Studies Ordered:   No orders of the defined types were placed in this encounter.    Glenetta Hew, M.D., M.S. Interventional Cardiologist   Pager # [phone] Phone # [phone] [address] [place], [place] [address]   Thank you for choosing Heartcare at [facility]!!

## 2020-01-03 ENCOUNTER — Encounter: Payer: Self-pay | Admitting: Cardiology

## 2020-01-03 DIAGNOSIS — R002 Palpitations: Secondary | ICD-10-CM | POA: Insufficient documentation

## 2020-01-03 NOTE — Assessment & Plan Note (Signed)
Inaccurate diagnosis.  Echocardiogram does not show any evidence of mitral prolapse.

## 2020-01-03 NOTE — Assessment & Plan Note (Signed)
Probably feeling PVCs.  What he noted his palpitations were PVCs with bigeminy.  This would potentially be irregularly irregular type of rhythm. His blood pressure seems pretty well controlled today, but if he needed more control, would consider a long-acting beta-blocker such as Toprol or nadolol.

## 2020-01-03 NOTE — Assessment & Plan Note (Signed)
Monitor did not show any evidence of A. fib.  He did have some PVCs and bigeminy.  We talked about maybe potentially obtaining a personal home monitor - Jodelle Red that is associated with a smart phone application.  Unless we can capture A. fib, cannot confirm presence or absence of A. fib

## 2020-01-03 NOTE — Assessment & Plan Note (Signed)
Normal echocardiogram and nonischemic Myoview. He had a fixed defect with no wall motion abnormality.  This is classic for artifact known as gut attenuation artifact.

## 2020-01-12 ENCOUNTER — Ambulatory Visit: Payer: PPO

## 2020-01-21 ENCOUNTER — Ambulatory Visit: Payer: PPO | Attending: Internal Medicine

## 2020-01-21 DIAGNOSIS — Z23 Encounter for immunization: Secondary | ICD-10-CM | POA: Insufficient documentation

## 2020-01-21 NOTE — Progress Notes (Signed)
   Covid-19 Vaccination Clinic  Name:  Alec Wilson    MRN: [id number] DOB: [birth year]  01/21/2020  [name] was observed post Covid-19 immunization for 15 minutes without incident. He was provided with Vaccine Information Sheet and instruction to access the V-Safe system.   [name] was instructed to call 911 with any severe reactions post vaccine: Marland Kitchen Difficulty breathing  . Swelling of face and throat  . A fast heartbeat  . A bad rash all over body  . Dizziness and weakness   Immunizations Administered    Name Date Dose VIS Date Route   Pfizer COVID-19 Vaccine 01/21/2020  8:53 AM 0.3 mL 11/01/2019 Intramuscular   Manufacturer: [place]   Lot: [id number]   [place]: [id number]

## 2020-06-16 ENCOUNTER — Other Ambulatory Visit: Payer: Self-pay

## 2020-07-14 ENCOUNTER — Encounter: Payer: Self-pay | Admitting: Primary Care

## 2020-07-14 ENCOUNTER — Ambulatory Visit (INDEPENDENT_AMBULATORY_CARE_PROVIDER_SITE_OTHER): Payer: PPO | Admitting: Primary Care

## 2020-07-14 ENCOUNTER — Other Ambulatory Visit: Payer: Self-pay

## 2020-07-14 VITALS — BP 118/72 | HR 106 | Temp 96.0°F | Ht 68.0 in | Wt 179.8 lb

## 2020-07-14 DIAGNOSIS — L989 Disorder of the skin and subcutaneous tissue, unspecified: Secondary | ICD-10-CM | POA: Diagnosis not present

## 2020-07-14 NOTE — Assessment & Plan Note (Signed)
Evident on exam today, suspicious for skin cancer. Referral placed to dermatology.

## 2020-07-14 NOTE — Patient Instructions (Signed)
You will be contacted regarding your referral to dermatology.  Please let us know if you have not been contacted within two weeks.   Make sure to use sunscreen outdoors for protection.  It was a pleasure to see you today!

## 2020-07-14 NOTE — Progress Notes (Signed)
Subjective:    Patient ID: Alec Wilson, male    DOB: 1951/01/06, 69 y.o.   MRN: [id number]  HPI  This visit occurred during the SARS-CoV-2 public health emergency.  Safety protocols were in place, including screening questions prior to the visit, additional usage of staff PPE, and extensive cleaning of exam room while observing appropriate contact time as indicated for disinfecting solutions.   [name] is a 69 year old male with a history of hypertension, arthritis, hyperlipidemia, atrial fibrillation who presents today with a chief complaint of skin lesions.  He's had one skin lesion to the left lateral calf for the past year which will enlarge, pop, and then becomes scaly. A few weeks ago he noticed a new sore to the left lower extremity below the calf which cracked open and drained. Several days after that he noticed another new lesion to the left anterior lower extremity between the patella and ankle.   The sites are itchy, not painful. Scaly and red. He's not applied anything topically. He is outdoors in the sun, doesn't wear skin protection.   Review of Systems  Constitutional: Negative for fever.  Skin:       Skin lesions       Past Medical History:  Diagnosis Date  . Adenomatous colon polyp    last colonoscopy was 3 years ago.  . Anemia   . Diverticulosis   . Hemorrhoids summer 2005   s/p ligation of internal hemorrhoids  . HTN (hypertension)   . Hyperlipidemia   . Inguinal hernia   . PAF (paroxysmal atrial fibrillation) ([place]) 1999   Stress ECHO-Negative (09/22/98) -> no further documented evidence of A. fib  . RMSF [facility] spotted fever) July 2005     Social History   Socioeconomic History  . Marital status: Married    Spouse name: Not on file  . Number of children: Not on file  . Years of education: Not on file  . Highest education level: Not on file  Occupational History  . Not on file  Tobacco Use  . Smoking status: Former Smoker    Packs/day:  1.00    Types: Cigarettes  . Smokeless tobacco: Former Systems developer    Quit date: 02/19/1998  Substance and Sexual Activity  . Alcohol use: Yes    Alcohol/week: 2.0 standard drinks    Types: 2 Glasses of wine per week    Comment: weekly  . Drug use: No  . Sexual activity: Yes    Birth control/protection: Surgical  Other Topics Concern  . Not on file  Social History Narrative   From [place].   Works at [employer] in [employer].   Married.  2 children: 39, 27.    Daughters live in [place] and [place].   Enjoys golfing, visiting vineyards, traveling --> exercises 6 days a week 1 walking and playing golf      Quit smoking in 1999.   Drinks roughly 5 glasses of wine a week.   Social Determinants of Health   Financial Resource Strain: Low Risk   . Difficulty of Paying Living Expenses: Not hard at all  Food Insecurity: No Food Insecurity  . Worried About Charity fundraiser in the Last Year: Never true  . Ran Out of Food in the Last Year: Never true  Transportation Needs: No Transportation Needs  . Lack of Transportation (Medical): No  . Lack of Transportation (Non-Medical): No  Physical Activity: Sufficiently Active  .  Days of Exercise per Week: 7 days  . Minutes of Exercise per Session: 30 min  Stress: No Stress Concern Present  . Feeling of Stress : Not at all  Social Connections:   . Frequency of Communication with Friends and Family: Not on file  . Frequency of Social Gatherings with Friends and Family: Not on file  . Attends Religious Services: Not on file  . Active Member of Clubs or Organizations: Not on file  . Attends Archivist Meetings: Not on file  . Marital Status: Not on file  Intimate Partner Violence: Not At Risk  . Fear of Current or Ex-Partner: No  . Emotionally Abused: No  . Physically Abused: No  . Sexually Abused: No    Past Surgical History:  Procedure Laterality Date  . CARDIAC EVENT MONITOR  10/2019   Baseline sample showed sinus  rhythm with average rate 79 bpm.  Minimum heart rate 48 bpm.  Maximum heart rate sinus tachycardia 159 bpm. <1% PVCs.  No A. fib.  Bradycardia was noted during sleeping hours.  Patient had one episode of sensation of fluttering or skipped beats during which she had a short run of sinus rhythm with PVCs in bigeminy.  . COLONOSCOPY  2013  . [place]  2005  . INGUINAL HERNIA REPAIR Right 2019  . NM MYOVIEW LTD  11/2019   EF 60%..  Medium size, mild severity fixed apical defect with normal wall motion consistent with artifact.  LOW Risk  . THROAT SURGERY  childhood  . TRANSTHORACIC ECHOCARDIOGRAM  12/04/2019    EF 60 to 65%.  No R WMA.  Mild basal septal hypertrophy.  Normal RV function but mildly increased size.  Normal atrial sizes.  Mild aortic sclerosis, no stenosis.  Marland Kitchen VASECTOMY      Family History  Problem Relation Age of Onset  . Coronary artery disease Father 62       CABG at age 11, MI at 59.  . Parkinson's disease Father   . Colon polyps Father   . Congestive Heart Failure Father   . Hypertension Brother   . Hypertension Mother   . Arthritis Mother   . Heart attack Mother 47  . Cancer Maternal Grandmother   . Cancer Maternal Grandfather   . Heart attack Paternal Grandfather 40  . Rectal cancer Neg Hx   . Stomach cancer Neg Hx   . Colon cancer Neg Hx   . Esophageal cancer Neg Hx     No Known Allergies  Current Outpatient Medications on File Prior to Visit  Medication Sig Dispense Refill  . aspirin 81 MG tablet Take 81 mg by mouth daily.    . fish oil-omega-3 fatty acids 1000 MG capsule Take 1 g by mouth daily.    Marland Kitchen losartan (COZAAR) 25 MG tablet Take 1 tablet (25 mg total) by mouth daily. For blood pressure. 90 tablet 3  . Multiple Vitamins-Minerals (MULTIVITAMIN PO) Take 1 Centrum Silver multivitamin by mouth daily    . simvastatin (ZOCOR) 20 MG tablet TAKE 1 TABLET BY MOUTH  EVERY EVENING for cholesterol. 90 tablet 3   No current facility-administered  medications on file prior to visit.    BP 118/72   Pulse (!) 106   Temp (!) 96 F (35.6 C) (Temporal)   Ht 5\' 8"  (1.727 m)   Wt 179 lb 12 oz (81.5 kg)   SpO2 98%   BMI 27.33 kg/m    Objective:   Physical Exam Skin:  General: Skin is warm and dry.          Comments: Three scaly, red, raised lesions to left lower extremity. Suspicious for skin cancer.  Neurological:     Mental Status: He is alert.            Assessment & Plan:

## 2020-10-07 DIAGNOSIS — H524 Presbyopia: Secondary | ICD-10-CM | POA: Diagnosis not present

## 2020-11-04 ENCOUNTER — Ambulatory Visit: Payer: PPO

## 2020-11-04 ENCOUNTER — Other Ambulatory Visit: Payer: Self-pay | Admitting: Primary Care

## 2020-11-04 DIAGNOSIS — E785 Hyperlipidemia, unspecified: Secondary | ICD-10-CM

## 2020-11-04 DIAGNOSIS — I10 Essential (primary) hypertension: Secondary | ICD-10-CM

## 2020-11-06 ENCOUNTER — Ambulatory Visit: Payer: PPO

## 2020-11-09 ENCOUNTER — Other Ambulatory Visit: Payer: Self-pay

## 2020-11-09 ENCOUNTER — Ambulatory Visit: Payer: PPO | Admitting: Dermatology

## 2020-11-09 DIAGNOSIS — L814 Other melanin hyperpigmentation: Secondary | ICD-10-CM

## 2020-11-09 DIAGNOSIS — D229 Melanocytic nevi, unspecified: Secondary | ICD-10-CM | POA: Diagnosis not present

## 2020-11-09 DIAGNOSIS — L570 Actinic keratosis: Secondary | ICD-10-CM

## 2020-11-09 DIAGNOSIS — L578 Other skin changes due to chronic exposure to nonionizing radiation: Secondary | ICD-10-CM

## 2020-11-09 DIAGNOSIS — D1800 Hemangioma unspecified site: Secondary | ICD-10-CM | POA: Diagnosis not present

## 2020-11-09 DIAGNOSIS — L821 Other seborrheic keratosis: Secondary | ICD-10-CM | POA: Diagnosis not present

## 2020-11-09 DIAGNOSIS — Z1283 Encounter for screening for malignant neoplasm of skin: Secondary | ICD-10-CM | POA: Diagnosis not present

## 2020-11-09 NOTE — Patient Instructions (Addendum)

## 2020-11-09 NOTE — Progress Notes (Signed)
New Patient Visit  Subjective  Alec Wilson is a 69 y.o. male who presents for the following: Skin Problem (Patient made appt a few months ago for some spots on his legs that have since cleared up. No personal history of skin cancer. ). The patient presents for Upper Body Skin Exam (UBSE) for skin cancer screening and mole check.  The following portions of the chart were reviewed this encounter and updated as appropriate:   Tobacco  Allergies  Meds  Problems  Med Hx  Surg Hx  Fam Hx     Review of Systems:  No other skin or systemic complaints except as noted in HPI or Assessment and Plan.  Objective  Well appearing patient in no apparent distress; mood and affect are within normal limits.  All skin waist up examined.  Objective  Scalp x 6, R ear x 1, R face x 5, L ear x 1, L zygoma x 1, nasal bridge x 1, R hand x 12, L hand x 11 (38): Erythematous thin papules/macules with gritty scale.    Assessment & Plan  AK (actinic keratosis) (38) Scalp x 6, R ear x 1, R face x 5, L ear x 1, L zygoma x 1, nasal bridge x 1, R hand x 12, L hand x 11  Destruction of lesion - Scalp x 6, R ear x 1, R face x 5, L ear x 1, L zygoma x 1, nasal bridge x 1, R hand x 12, L hand x 11 Complexity: simple   Destruction method: cryotherapy   Informed consent: discussed and consent obtained   Timeout:  patient name, date of birth, surgical site, and procedure verified Lesion destroyed using liquid nitrogen: Yes   Region frozen until ice ball extended beyond lesion: Yes   Outcome: patient tolerated procedure well with no complications   Post-procedure details: wound care instructions given    Skin cancer screening   Lentigines - Scattered tan macules - Discussed due to sun exposure - Benign, observe - Call for any changes  Seborrheic Keratoses - Stuck-on, waxy, tan-brown papules and plaques  - Discussed benign etiology and prognosis. - Observe - Call for any changes  Melanocytic Nevi -  Tan-brown and/or pink-flesh-colored symmetric macules and papules - Benign appearing on exam today - Observation - Call clinic for new or changing moles - Recommend daily use of broad spectrum spf 30+ sunscreen to sun-exposed areas.   Hemangiomas - Red papules - Discussed benign nature - Observe - Call for any changes  Actinic Damage - Chronic, secondary to cumulative UV/sun exposure - diffuse scaly erythematous macules with underlying dyspigmentation - Recommend daily broad spectrum sunscreen SPF 30+ to sun-exposed areas, reapply every 2 hours as needed.  - Call for new or changing lesions.  Severe, Confluent Chronic Actinic Changes with Pre-Cancerous Actinic Keratoses due to cumulative sun exposure/UV radiation exposure over time - Discussed Prescription "Field Treatment" Field treatment involves treatment of an entire area of skin that has confluent Actinic Changes (Sun/ Ultraviolet light damage) and PreCancerous Actinic Keratoses by method of PhotoDynamic Therapy (PDT) and/or prescription Topical Chemotherapy agents such as 5-fluorouracil, 5-fluorouracil/calcipotriene, and/or imiquimod.  The purpose is to decrease the number of clinically evident and subclinical PreCancerous lesions to prevent progression to development of skin cancer by chemically destroying early precancer changes that may or may not be visible.  It has been shown to reduce the risk of developing skin cancer in the treated area. As a result of treatment, redness,  scaling, crusting, and open sores may occur during treatment course. One or more than one of these methods may be used and may have to be used several times to control, suppress and eliminate the PreCancerous changes. Discussed treatment course, expected reaction, and possible side effects.  Skin cancer screening performed today.  Return in about 2 months (around 01/10/2021) for AK follow up.  Graciella Belton, RMA, am acting as scribe for Sarina Ser, MD  . Documentation: I have reviewed the above documentation for accuracy and completeness, and I agree with the above.  Sarina Ser, MD

## 2020-11-10 ENCOUNTER — Ambulatory Visit (INDEPENDENT_AMBULATORY_CARE_PROVIDER_SITE_OTHER): Payer: PPO | Admitting: Primary Care

## 2020-11-10 ENCOUNTER — Encounter: Payer: Self-pay | Admitting: Primary Care

## 2020-11-10 ENCOUNTER — Other Ambulatory Visit: Payer: Self-pay

## 2020-11-10 VITALS — BP 120/68 | HR 70 | Temp 97.9°F | Ht 68.0 in | Wt 181.0 lb

## 2020-11-10 DIAGNOSIS — Z0000 Encounter for general adult medical examination without abnormal findings: Secondary | ICD-10-CM

## 2020-11-10 DIAGNOSIS — Z125 Encounter for screening for malignant neoplasm of prostate: Secondary | ICD-10-CM

## 2020-11-10 DIAGNOSIS — Z1159 Encounter for screening for other viral diseases: Secondary | ICD-10-CM

## 2020-11-10 DIAGNOSIS — L989 Disorder of the skin and subcutaneous tissue, unspecified: Secondary | ICD-10-CM

## 2020-11-10 DIAGNOSIS — M1990 Unspecified osteoarthritis, unspecified site: Secondary | ICD-10-CM

## 2020-11-10 DIAGNOSIS — E785 Hyperlipidemia, unspecified: Secondary | ICD-10-CM | POA: Diagnosis not present

## 2020-11-10 DIAGNOSIS — I10 Essential (primary) hypertension: Secondary | ICD-10-CM

## 2020-11-10 DIAGNOSIS — Z860100 Personal history of colon polyps, unspecified: Secondary | ICD-10-CM

## 2020-11-10 DIAGNOSIS — I499 Cardiac arrhythmia, unspecified: Secondary | ICD-10-CM | POA: Diagnosis not present

## 2020-11-10 LAB — COMPREHENSIVE METABOLIC PANEL
ALT: 19 U/L (ref 0–53)
AST: 15 U/L (ref 0–37)
Albumin: 4.1 g/dL (ref 3.5–5.2)
Alkaline Phosphatase: 91 U/L (ref 39–117)
BUN: 14 mg/dL (ref 6–23)
CO2: 30 mEq/L (ref 19–32)
Calcium: 9.4 mg/dL (ref 8.4–10.5)
Chloride: 103 mEq/L (ref 96–112)
Creatinine, Ser: 0.79 mg/dL (ref 0.40–1.50)
GFR: 92.07 mL/min (ref >60.00)
Glucose, Bld: 94 mg/dL (ref 70–99)
Potassium: 5 mEq/L (ref 3.5–5.1)
Sodium: 137 mEq/L (ref 135–145)
Total Bilirubin: 0.7 mg/dL (ref 0.2–1.2)
Total Protein: 6.6 g/dL (ref 6.0–8.3)

## 2020-11-10 LAB — CBC
HCT: 43.2 % (ref 39.0–52.0)
Hemoglobin: 14.7 g/dL (ref 13.0–17.0)
MCHC: 33.9 g/dL (ref 30.0–36.0)
MCV: 93.7 fl (ref 78.0–100.0)
Platelets: 258 10*3/uL (ref 150.0–400.0)
RBC: 4.61 Mil/uL (ref 4.22–5.81)
RDW: 13.2 % (ref 11.5–15.5)
WBC: 4.5 10*3/uL (ref 4.0–10.5)

## 2020-11-10 LAB — LIPID PANEL
Cholesterol: 161 mg/dL (ref 0–200)
HDL: 77.2 mg/dL (ref >39.00)
LDL Cholesterol: 58 mg/dL (ref 0–99)
NonHDL: 83.54
Total CHOL/HDL Ratio: 2
Triglycerides: 128 mg/dL (ref 0.0–149.0)
VLDL: 25.6 mg/dL (ref 0.0–40.0)

## 2020-11-10 LAB — PSA, MEDICARE: PSA: 1.45 ng/ml (ref 0.10–4.00)

## 2020-11-10 NOTE — Assessment & Plan Note (Signed)
Chronic, stable, no concerns today.

## 2020-11-10 NOTE — Patient Instructions (Signed)
Stop by the lab prior to leaving today. I will notify you of your results once received.   Continue exercising. You should be getting 150 minutes of moderate intensity exercise weekly.  Continue to work on a healthy diet. Ensure you are consuming 64 ounces of water daily.  It was a pleasure to see you today!   Preventive Care 55 Years and Older, Male Preventive care refers to lifestyle choices and visits with your health care provider that can promote health and wellness. This includes:  A yearly physical exam. This is also called an annual well check.  Regular dental and eye exams.  Immunizations.  Screening for certain conditions.  Healthy lifestyle choices, such as diet and exercise. What can I expect for my preventive care visit? Physical exam Your health care provider will check:  Height and weight. These may be used to calculate body mass index (BMI), which is a measurement that tells if you are at a healthy weight.  Heart rate and blood pressure.  Your skin for abnormal spots. Counseling Your health care provider may ask you questions about:  Alcohol, tobacco, and drug use.  Emotional well-being.  Home and relationship well-being.  Sexual activity.  Eating habits.  History of falls.  Memory and ability to understand (cognition).  Work and work Statistician. What immunizations do I need?  Influenza (flu) vaccine  This is recommended every year. Tetanus, diphtheria, and pertussis (Tdap) vaccine  You may need a Td booster every 10 years. Varicella (chickenpox) vaccine  You may need this vaccine if you have not already been vaccinated. Zoster (shingles) vaccine  You may need this after age 45. Pneumococcal conjugate (PCV13) vaccine  One dose is recommended after age 31. Pneumococcal polysaccharide (PPSV23) vaccine  One dose is recommended after age 43. Measles, mumps, and rubella (MMR) vaccine  You may need at least one dose of MMR if you were  born in 1957 or later. You may also need a second dose. Meningococcal conjugate (MenACWY) vaccine  You may need this if you have certain conditions. Hepatitis A vaccine  You may need this if you have certain conditions or if you travel or work in places where you may be exposed to hepatitis A. Hepatitis B vaccine  You may need this if you have certain conditions or if you travel or work in places where you may be exposed to hepatitis B. Haemophilus influenzae type b (Hib) vaccine  You may need this if you have certain conditions. You may receive vaccines as individual doses or as more than one vaccine together in one shot (combination vaccines). Talk with your health care provider about the risks and benefits of combination vaccines. What tests do I need? Blood tests  Lipid and cholesterol levels. These may be checked every 5 years, or more frequently depending on your overall health.  Hepatitis C test.  Hepatitis B test. Screening  Lung cancer screening. You may have this screening every year starting at age 17 if you have a 30-pack-year history of smoking and currently smoke or have quit within the past 15 years.  Colorectal cancer screening. All adults should have this screening starting at age 43 and continuing until age 76. Your health care provider may recommend screening at age 29 if you are at increased risk. You will have tests every 1-10 years, depending on your results and the type of screening test.  Prostate cancer screening. Recommendations will vary depending on your family history and other risks.  Diabetes screening.  This is done by checking your blood sugar (glucose) after you have not eaten for a while (fasting). You may have this done every 1-3 years.  Abdominal aortic aneurysm (AAA) screening. You may need this if you are a current or former smoker.  Sexually transmitted disease (STD) testing. Follow these instructions at home: Eating and drinking  Eat a  diet that includes fresh fruits and vegetables, whole grains, lean protein, and low-fat dairy products. Limit your intake of foods with high amounts of sugar, saturated fats, and salt.  Take vitamin and mineral supplements as recommended by your health care provider.  Do not drink alcohol if your health care provider tells you not to drink.  If you drink alcohol: ? Limit how much you have to 0-2 drinks a day. ? Be aware of how much alcohol is in your drink. In the [place] one drink equals one 12 oz bottle of beer (355 mL), one 5 oz glass of wine (148 mL), or one 1 oz glass of hard liquor (44 mL). Lifestyle  Take daily care of your teeth and gums.  Stay active. Exercise for at least 30 minutes on 5 or more days each week.  Do not use any products that contain nicotine or tobacco, such as cigarettes, e-cigarettes, and chewing tobacco. If you need help quitting, ask your health care provider.  If you are sexually active, practice safe sex. Use a condom or other form of protection to prevent STIs (sexually transmitted infections).  Talk with your health care provider about taking a low-dose aspirin or statin. What's next?  Visit your health care provider once a year for a well check visit.  Ask your health care provider how often you should have your eyes and teeth checked.  Stay up to date on all vaccines. This information is not intended to replace advice given to you by your health care provider. Make sure you discuss any questions you have with your health care provider. Document Revised: 11/01/2018 Document Reviewed: 11/01/2018 Elsevier Patient Education  2020 [employer].

## 2020-11-10 NOTE — Assessment & Plan Note (Signed)
Reviewed Dr. Celesta Aver letter and recommendations, recall in 2023. Patient aware.

## 2020-11-10 NOTE — Assessment & Plan Note (Addendum)
Well controlled on losartan 25 mg, CMP pending. Continue losartan 25 mg.

## 2020-11-10 NOTE — Assessment & Plan Note (Signed)
Evaluated by dermatology yesterday, numerous precancerous lesions frozen. He will follow with dermatology regularly.

## 2020-11-10 NOTE — Assessment & Plan Note (Signed)
Immunizations UTD. PSA due and pending. Colonoscopy UTD, due in 2023.  Discussed the importance of a healthy diet and regular exercise in order for weight loss, and to reduce the risk of any potential medical problems.  Exam today unremarkable Labs pending.

## 2020-11-10 NOTE — Assessment & Plan Note (Addendum)
No evidence of atrial fibrillation per cardiology visit in February 2021. Today rate and rhythm are regular with one PVC noted.  Continue to monitor.

## 2020-11-10 NOTE — Progress Notes (Signed)
Subjective:    Patient ID: Alec Wilson, male    DOB: October 20, 1951, 69 y.o.   MRN: [id number]  HPI  This visit occurred during the SARS-CoV-2 public health emergency.  Safety protocols were in place, including screening questions prior to the visit, additional usage of staff PPE, and extensive cleaning of exam room while observing appropriate contact time as indicated for disinfecting solutions.   Alec Wilson is a 69 year old male who presents today for complete physical.  Immunizations: -Tetanus: 2008 -Influenza: Completed this season  -Shingles: Completed series -Pneumonia:  Prevnar in 2019, Pneumovax in 2020 -Covid-19: Completed series  Diet: He endorses a fair diet.  Exercise: He is exercising daily  Eye exam: Completes annually  Dental exam: Completes semi-annually   Colonoscopy: Completed in 2016, due June 2023 PSA: Due and pending. Hep C Screen: Due  BP Readings from Last 3 Encounters:  11/10/20 120/68  07/14/20 118/72  01/01/20 128/78     Review of Systems  Constitutional: Negative for unexpected weight change.  HENT: Negative for rhinorrhea.   Eyes: Negative for visual disturbance.  Respiratory: Negative for cough and shortness of breath.   Cardiovascular: Negative for chest pain.  Gastrointestinal: Negative for constipation and diarrhea.  Genitourinary: Negative for difficulty urinating.  Musculoskeletal: Positive for arthralgias.  Skin: Negative for rash.  Allergic/Immunologic: Negative for environmental allergies.  Neurological: Negative for dizziness and headaches.  Psychiatric/Behavioral: The patient is not nervous/anxious.        Past Medical History:  Diagnosis Date  . Adenomatous colon polyp    last colonoscopy was 3 years ago.  . Anemia   . Diverticulosis   . Hemorrhoids summer 2005   s/p ligation of internal hemorrhoids  . HTN (hypertension)   . Hyperlipidemia   . Inguinal hernia   . PAF (paroxysmal atrial fibrillation) ([place]) 1999    Stress ECHO-Negative (09/22/98) -> no further documented evidence of A. fib  . RMS[facility] spotted fever) July 2005     Social History   Socioeconomic History  . Marital status: Married    Spouse name: Not on file  . Number of children: Not on file  . Years of education: Not on file  . Highest education level: Not on file  Occupational History  . Not on file  Tobacco Use  . Smoking status: Former Smoker    Packs/day: 1.00    Types: Cigarettes  . Smokeless tobacco: Former Systems developer    Quit date: 02/19/1998  Substance and Sexual Activity  . Alcohol use: Yes    Alcohol/week: 2.0 standard drinks    Types: 2 Glasses of wine per week    Comment: weekly  . Drug use: No  . Sexual activity: Yes    Birth control/protection: Surgical  Other Topics Concern  . Not on file  Social History Narrative   From [place].   Works at [employer] in [employer].   Married.  2 children: 39, 27.    Daughters live in [place] and [place].   Enjoys golfing, visiting vineyards, traveling --> exercises 6 days a week 1 walking and playing golf      Quit smoking in 1999.   Drinks roughly 5 glasses of wine a week.   Social Determinants of Health   Financial Resource Strain: Not on file  Food Insecurity: Not on file  Transportation Needs: Not on file  Physical Activity: Not on file  Stress: Not on file  Social Connections: Not on file  Intimate Partner Violence: Not on file    Past Surgical History:  Procedure Laterality Date  . CARDIAC EVENT MONITOR  10/2019   Baseline sample showed sinus rhythm with average rate 79 bpm.  Minimum heart rate 48 bpm.  Maximum heart rate sinus tachycardia 159 bpm. <1% PVCs.  No A. fib.  Bradycardia was noted during sleeping hours.  Patient had one episode of sensation of fluttering or skipped beats during which she had a short run of sinus rhythm with PVCs in bigeminy.  . COLONOSCOPY  2013  . [place]  2005  . INGUINAL HERNIA REPAIR Right  2019  . NM MYOVIEW LTD  11/2019   EF 60%..  Medium size, mild severity fixed apical defect with normal wall motion consistent with artifact.  LOW Risk  . THROAT SURGERY  childhood  . TRANSTHORACIC ECHOCARDIOGRAM  12/04/2019    EF 60 to 65%.  No R WMA.  Mild basal septal hypertrophy.  Normal RV function but mildly increased size.  Normal atrial sizes.  Mild aortic sclerosis, no stenosis.  Marland Kitchen VASECTOMY      Family History  Problem Relation Age of Onset  . Coronary artery disease Father 4       CABG at age 67, MI at 24.  . Parkinson's disease Father   . Colon polyps Father   . Congestive Heart Failure Father   . Hypertension Brother   . Hypertension Mother   . Arthritis Mother   . Heart attack Mother 69  . Cancer Maternal Grandmother   . Cancer Maternal Grandfather   . Heart attack Paternal Grandfather 44  . Rectal cancer Neg Hx   . Stomach cancer Neg Hx   . Colon cancer Neg Hx   . Esophageal cancer Neg Hx     No Known Allergies  Current Outpatient Medications on File Prior to Visit  Medication Sig Dispense Refill  . aspirin 81 MG tablet Take 81 mg by mouth daily.    . fish oil-omega-3 fatty acids 1000 MG capsule Take 1 g by mouth daily.    Marland Kitchen losartan (COZAAR) 25 MG tablet TAKE 1 TABLET BY MOUTH EVERY DAY FOR BLOOD PRESSURE 90 tablet 3  . Multiple Vitamins-Minerals (MULTIVITAMIN PO) Take 1 Centrum Silver multivitamin by mouth daily    . simvastatin (ZOCOR) 20 MG tablet TAKE 1 TABLET BY MOUTH EVERY DAY IN THE EVENING FOR CHOLESTEROL 90 tablet 3   No current facility-administered medications on file prior to visit.    BP 120/68   Pulse 70   Temp 97.9 F (36.6 C) (Temporal)   Ht 5\' 8"  (1.727 m)   Wt 181 lb (82.1 kg)   SpO2 95%   BMI 27.52 kg/m    Objective:   Physical Exam Constitutional:      Appearance: He is well-nourished.  HENT:     Right Ear: Tympanic membrane and ear canal normal.     Left Ear: Tympanic membrane and ear canal normal.     Mouth/Throat:      Mouth: Oropharynx is clear and moist.  Eyes:     Extraocular Movements: EOM normal.     Pupils: Pupils are equal, round, and reactive to light.  Cardiovascular:     Rate and Rhythm: Normal rate and regular rhythm.  Pulmonary:     Effort: Pulmonary effort is normal.     Breath sounds: Normal breath sounds.  Abdominal:     General: Bowel sounds are normal.     Palpations: Abdomen is  soft.     Tenderness: There is no abdominal tenderness.  Musculoskeletal:        General: Normal range of motion.     Cervical back: Neck supple.  Skin:    General: Skin is warm and dry.  Neurological:     Mental Status: He is alert and oriented to person, place, and time.     Cranial Nerves: No cranial nerve deficit.     Deep Tendon Reflexes:     Reflex Scores:      Patellar reflexes are 2+ on the right side and 2+ on the left side. Psychiatric:        Mood and Affect: Mood and affect and mood normal.            Assessment & Plan:

## 2020-11-11 LAB — HEPATITIS C ANTIBODY
Hepatitis C Ab: NONREACTIVE
SIGNAL TO CUT-OFF: 0 (ref <1.00)

## 2020-11-18 ENCOUNTER — Encounter: Payer: Self-pay | Admitting: Dermatology

## 2020-12-04 ENCOUNTER — Ambulatory Visit (INDEPENDENT_AMBULATORY_CARE_PROVIDER_SITE_OTHER): Payer: PPO

## 2020-12-04 ENCOUNTER — Other Ambulatory Visit: Payer: Self-pay

## 2020-12-04 DIAGNOSIS — Z0000 Encounter for general adult medical examination without abnormal findings: Secondary | ICD-10-CM | POA: Diagnosis not present

## 2020-12-04 NOTE — Progress Notes (Signed)
PCP notes:  Health Maintenance: Tdap- insurance   Abnormal Screenings: none   Patient concerns: none   Nurse concerns: none   Next PCP appt.: none 

## 2020-12-04 NOTE — Patient Instructions (Signed)
Alec Wilson , Thank you for taking time to come for your Medicare Wellness Visit. I appreciate your ongoing commitment to your health goals. Please review the following plan we discussed and let me know if I can assist you in the future.   Screening recommendations/referrals: Colonoscopy: Up to date, completed 04/22/2015, due 04/2022 Recommended yearly ophthalmology/optometry visit for glaucoma screening and checkup Recommended yearly dental visit for hygiene and checkup  Vaccinations: Influenza vaccine: Up to date, completed 09/08/2020, due 06/2021 Pneumococcal vaccine: Completed series Tdap vaccine: decline-insurance Shingles vaccine: Completed series   Covid-19: Completed series  Advanced directives: Please bring a copy of your POA (Power of Attorney) and/or Living Will to your next appointment.   Conditions/risks identified: hypertension, hyperlipidemia  Next appointment: Follow up in one year for your annual wellness visit.   Preventive Care 68 Years and Older, Male Preventive care refers to lifestyle choices and visits with your health care provider that can promote health and wellness. What does preventive care include?  A yearly physical exam. This is also called an annual well check.  Dental exams once or twice a year.  Routine eye exams. Ask your health care provider how often you should have your eyes checked.  Personal lifestyle choices, including:  Daily care of your teeth and gums.  Regular physical activity.  Eating a healthy diet.  Avoiding tobacco and drug use.  Limiting alcohol use.  Practicing safe sex.  Taking low doses of aspirin every day.  Taking vitamin and mineral supplements as recommended by your health care provider. What happens during an annual well check? The services and screenings done by your health care provider during your annual well check will depend on your age, overall health, lifestyle risk factors, and family history of  disease. Counseling  Your health care provider may ask you questions about your:  Alcohol use.  Tobacco use.  Drug use.  Emotional well-being.  Home and relationship well-being.  Sexual activity.  Eating habits.  History of falls.  Memory and ability to understand (cognition).  Work and work Statistician. Screening  You may have the following tests or measurements:  Height, weight, and BMI.  Blood pressure.  Lipid and cholesterol levels. These may be checked every 5 years, or more frequently if you are over 29 years old.  Skin check.  Lung cancer screening. You may have this screening every year starting at age 76 if you have a 30-pack-year history of smoking and currently smoke or have quit within the past 15 years.  Fecal occult blood test (FOBT) of the stool. You may have this test every year starting at age 52.  Flexible sigmoidoscopy or colonoscopy. You may have a sigmoidoscopy every 5 years or a colonoscopy every 10 years starting at age 12.  Prostate cancer screening. Recommendations will vary depending on your family history and other risks.  Hepatitis C blood test.  Hepatitis B blood test.  Sexually transmitted disease (STD) testing.  Diabetes screening. This is done by checking your blood sugar (glucose) after you have not eaten for a while (fasting). You may have this done every 1-3 years.  Abdominal aortic aneurysm (AAA) screening. You may need this if you are a current or former smoker.  Osteoporosis. You may be screened starting at age 37 if you are at high risk. Talk with your health care provider about your test results, treatment options, and if necessary, the need for more tests. Vaccines  Your health care provider may recommend certain vaccines, such  as:  Influenza vaccine. This is recommended every year.  Tetanus, diphtheria, and acellular pertussis (Tdap, Td) vaccine. You may need a Td booster every 10 years.  Zoster vaccine. You may  need this after age 21.  Pneumococcal 13-valent conjugate (PCV13) vaccine. One dose is recommended after age 50.  Pneumococcal polysaccharide (PPSV23) vaccine. One dose is recommended after age 45. Talk to your health care provider about which screenings and vaccines you need and how often you need them. This information is not intended to replace advice given to you by your health care provider. Make sure you discuss any questions you have with your health care provider. Document Released: 12/04/2015 Document Revised: 07/27/2016 Document Reviewed: 09/08/2015 Elsevier Interactive Patient Education  2017 [place] Prevention in the Home Falls can cause injuries. They can happen to people of all ages. There are many things you can do to make your home safe and to help prevent falls. What can I do on the outside of my home?  Regularly fix the edges of walkways and driveways and fix any cracks.  Remove anything that might make you trip as you walk through a door, such as a raised step or threshold.  Trim any bushes or trees on the path to your home.  Use bright outdoor lighting.  Clear any walking paths of anything that might make someone trip, such as rocks or tools.  Regularly check to see if handrails are loose or broken. Make sure that both sides of any steps have handrails.  Any raised decks and porches should have guardrails on the edges.  Have any leaves, snow, or ice cleared regularly.  Use sand or salt on walking paths during winter.  Clean up any spills in your garage right away. This includes oil or grease spills. What can I do in the bathroom?  Use night lights.  Install grab bars by the toilet and in the tub and shower. Do not use towel bars as grab bars.  Use non-skid mats or decals in the tub or shower.  If you need to sit down in the shower, use a plastic, non-slip stool.  Keep the floor dry. Clean up any water that spills on the floor as soon as it  happens.  Remove soap buildup in the tub or shower regularly.  Attach bath mats securely with double-sided non-slip rug tape.  Do not have throw rugs and other things on the floor that can make you trip. What can I do in the bedroom?  Use night lights.  Make sure that you have a light by your bed that is easy to reach.  Do not use any sheets or blankets that are too big for your bed. They should not hang down onto the floor.  Have a firm chair that has side arms. You can use this for support while you get dressed.  Do not have throw rugs and other things on the floor that can make you trip. What can I do in the kitchen?  Clean up any spills right away.  Avoid walking on wet floors.  Keep items that you use a lot in easy-to-reach places.  If you need to reach something above you, use a strong step stool that has a grab bar.  Keep electrical cords out of the way.  Do not use floor polish or wax that makes floors slippery. If you must use wax, use non-skid floor wax.  Do not have throw rugs and other things on the  floor that can make you trip. What can I do with my stairs?  Do not leave any items on the stairs.  Make sure that there are handrails on both sides of the stairs and use them. Fix handrails that are broken or loose. Make sure that handrails are as long as the stairways.  Check any carpeting to make sure that it is firmly attached to the stairs. Fix any carpet that is loose or worn.  Avoid having throw rugs at the top or bottom of the stairs. If you do have throw rugs, attach them to the floor with carpet tape.  Make sure that you have a light switch at the top of the stairs and the bottom of the stairs. If you do not have them, ask someone to add them for you. What else can I do to help prevent falls?  Wear shoes that:  Do not have high heels.  Have rubber bottoms.  Are comfortable and fit you well.  Are closed at the toe. Do not wear sandals.  If you  use a stepladder:  Make sure that it is fully opened. Do not climb a closed stepladder.  Make sure that both sides of the stepladder are locked into place.  Ask someone to hold it for you, if possible.  Clearly mark and make sure that you can see:  Any grab bars or handrails.  First and last steps.  Where the edge of each step is.  Use tools that help you move around (mobility aids) if they are needed. These include:  Canes.  Walkers.  Scooters.  Crutches.  Turn on the lights when you go into a dark area. Replace any light bulbs as soon as they burn out.  Set up your furniture so you have a clear path. Avoid moving your furniture around.  If any of your floors are uneven, fix them.  If there are any pets around you, be aware of where they are.  Review your medicines with your doctor. Some medicines can make you feel dizzy. This can increase your chance of falling. Ask your doctor what other things that you can do to help prevent falls. This information is not intended to replace advice given to you by your health care provider. Make sure you discuss any questions you have with your health care provider. Document Released: 09/03/2009 Document Revised: 04/14/2016 Document Reviewed: 12/12/2014 Elsevier Interactive Patient Education  2017 [employer].

## 2020-12-04 NOTE — Progress Notes (Signed)
Subjective:   Alec Wilson is a 70 y.o. male who presents for Medicare Annual/Subsequent preventive examination.  Review of Systems: N/A      I connected with the patient today by telephone and verified that I am speaking with the correct person using two identifiers. Location patient: home Location nurse: work Persons participating in the telephone visit: patient, nurse.   I discussed the limitations, risks, security and privacy concerns of performing an evaluation and management service by telephone and the availability of in person appointments. I also discussed with the patient that there may be a patient responsible charge related to this service. The patient expressed understanding and verbally consented to this telephonic visit.        Cardiac Risk Factors include: advanced age (>63men, >21 women);male gender;hypertension;Other (see comment), Risk factor comments: hyperlipidemia     Objective:    Today's Vitals   There is no height or weight on file to calculate BMI.  Advanced Directives 12/04/2020 10/29/2019 04/22/2015 04/08/2015  Does Patient Have a Medical Advance Directive? Yes Yes Yes Yes  Type of Paramedic of [place];Living will [place];Living will Healthcare Power of [place];Living will  Copy of [place] in Chart? No - copy requested No - copy requested - -    Current Medications (verified) Outpatient Encounter Medications as of 12/04/2020  Medication Sig  . aspirin 81 MG tablet Take 81 mg by mouth daily.  . fish oil-omega-3 fatty acids 1000 MG capsule Take 1 g by mouth daily.  Marland Kitchen losartan (COZAAR) 25 MG tablet TAKE 1 TABLET BY MOUTH EVERY DAY FOR BLOOD PRESSURE  . Multiple Vitamins-Minerals (MULTIVITAMIN PO) Take 1 Centrum Silver multivitamin by mouth daily  . simvastatin (ZOCOR) 20 MG tablet TAKE 1 TABLET BY MOUTH EVERY DAY IN THE EVENING FOR CHOLESTEROL   No  facility-administered encounter medications on file as of 12/04/2020.    Allergies (verified) Patient has no known allergies.   History: Past Medical History:  Diagnosis Date  . Adenomatous colon polyp    last colonoscopy was 3 years ago.  . Anemia   . Diverticulosis   . Hemorrhoids summer 2005   s/p ligation of internal hemorrhoids  . HTN (hypertension)   . Hyperlipidemia   . Inguinal hernia   . PAF (paroxysmal atrial fibrillation) ([place]) 1999   Stress ECHO-Negative (09/22/98) -> no further documented evidence of A. fib  . 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 CARDIAC EVENT MONITOR  10/2019   Baseline sample showed sinus rhythm with average rate 79 bpm.  Minimum heart rate 48 bpm.  Maximum heart rate sinus tachycardia 159 bpm. <1% PVCs.  No A. fib.  Bradycardia was noted during sleeping hours.  Patient had one episode of sensation of fluttering or skipped beats during which she had a short run of sinus rhythm with PVCs in bigeminy.  . COLONOSCOPY  2013  . [place]  2005  . INGUINAL HERNIA REPAIR Right 2019  . NM MYOVIEW LTD  11/2019   EF 60%..  Medium size, mild severity fixed apical defect with normal wall motion consistent with artifact.  LOW Risk  . THROAT SURGERY  childhood  . TRANSTHORACIC ECHOCARDIOGRAM  12/04/2019    EF 60 to 65%.  No R WMA.  Mild basal septal hypertrophy.  Normal RV function but mildly increased size.  Normal atrial sizes.  Mild aortic  sclerosis, no stenosis.  Marland Kitchen VASECTOMY     Family History  Problem Relation Age of Onset  . Coronary artery disease Father 32       CABG at age 33, MI at 6.  . Parkinson's disease Father   . Colon polyps Father   . Congestive Heart Failure Father   . Hypertension Brother   . Hypertension Mother   . Arthritis Mother   . Heart attack Mother 65  . Cancer Maternal Grandmother   . Cancer Maternal Grandfather   . Heart attack Paternal Grandfather  7  . Rectal cancer Neg Hx   . Stomach cancer Neg Hx   . Colon cancer Neg Hx   . Esophageal cancer Neg Hx    Social History   Socioeconomic History  . Marital status: Married    Spouse name: Not on file  . Number of children: Not on file  . Years of education: Not on file  . Highest education level: Not on file  Occupational History  . Not on file  Tobacco Use  . Smoking status: Former Smoker    Packs/day: 1.00    Types: Cigarettes  . Smokeless tobacco: Former Systems developer    Quit date: 02/19/1998  Substance and Sexual Activity  . Alcohol use: Yes    Alcohol/week: 2.0 standard drinks    Types: 2 Glasses of wine per week    Comment: weekly  . Drug use: No  . Sexual activity: Yes    Birth control/protection: Surgical  Other Topics Concern  . Not on file  Social History Narrative   From [place].   Works at [employer] in [employer].   Married.  2 children: 39, 27.    Daughters live in [place] and [place].   Enjoys golfing, visiting vineyards, traveling --> exercises 6 days a week 1 walking and playing golf      Quit smoking in 1999.   Drinks roughly 5 glasses of wine a week.   Social Determinants of Health   Financial Resource Strain: Low Risk   . Difficulty of Paying Living Expenses: Not hard at all  Food Insecurity: No Food Insecurity  . Worried About Charity fundraiser in the Last Year: Never true  . Ran Out of Food in the Last Year: Never true  Transportation Needs: No Transportation Needs  . Lack of Transportation (Medical): No  . Lack of Transportation (Non-Medical): No  Physical Activity: Sufficiently Active  . Days of Exercise per Week: 7 days  . Minutes of Exercise per Session: 60 min  Stress: No Stress Concern Present  . Feeling of Stress : Not at all  Social Connections: Not on file    Tobacco Counseling Counseling given: Not Answered   Clinical Intake:  Pre-visit preparation completed: Yes  Pain : No/denies pain     Nutritional  Risks: None Diabetes: No  How often do you need to have someone help you when you read instructions, pamphlets, or other written materials from your doctor or pharmacy?: 1 - Never What is the last grade level you completed in school?: bachelors  Diabetic: No Nutrition Risk Assessment:  Has the patient had any N/V/D within the last 2 months?  No  Does the patient have any non-healing wounds?  No  Has the patient had any unintentional weight loss or weight gain?  No   Diabetes:  Is the patient diabetic?  No  If diabetic, was a CBG obtained today?  N/A Did the  patient bring in their glucometer from home?  N/A How often do you monitor your CBG's? N/A.   Financial Strains and Diabetes Management:  Are you having any financial strains with the device, your supplies or your medication? N/A.  Does the patient want to be seen by Chronic Care Management for management of their diabetes?  N/A Would the patient like to be referred to a Nutritionist or for Diabetic Management?  N/A  Interpreter Needed?: No  Information entered by :: CJohnson, LPN   Activities of Daily Living In your present state of health, do you have any difficulty performing the following activities: 12/04/2020 11/10/2020  Hearing? N N  Vision? Y N  Comment has cataract on one eye -  Difficulty concentrating or making decisions? N N  Walking or climbing stairs? N N  Dressing or bathing? N N  Doing errands, shopping? N N  Preparing Food and eating ? N -  Using the Toilet? N -  In the past six months, have you accidently leaked urine? N -  Do you have problems with loss of bowel control? N -  Managing your Medications? N -  Managing your Finances? N -  Housekeeping or managing your Housekeeping? N -  Some recent data might be hidden    Patient Care Team: Pleas Koch, NP as PCP - General (Nurse Practitioner) Gatha Mayer, MD (Gastroenterology) Romeo Apple, MD (Inactive) (Cardiology)  Indicate  any recent Medical Services you may have received from other than [facility] providers in the past year (date may be approximate).     Assessment:   This is a routine wellness examination for [place].  Hearing/Vision screen  Hearing Screening   125Hz  250Hz  500Hz  1000Hz  2000Hz  3000Hz  4000Hz  6000Hz  8000Hz   Right ear:           Left ear:           Vision Screening Comments: Patient gets annual eye exams   Dietary issues and exercise activities discussed: Current Exercise Habits: Home exercise routine, Type of exercise: walking, Time (Minutes): 60, Frequency (Times/Week): 7, Weekly Exercise (Minutes/Week): 420, Intensity: Moderate, Exercise limited by: None identified  Goals    . Patient Stated     10/29/2019, I will maintain and continue medications as prescribed.     . Patient Stated     12/04/2020, I will continue to walk everyday for about 5 miles.       Depression Screen PHQ 2/9 Scores 12/04/2020 11/10/2020 10/29/2019 10/01/2018 08/30/2017  PHQ - 2 Score 0 0 0 0 0  PHQ- 9 Score 0 0 0 - -    Fall Risk Fall Risk  12/04/2020 11/10/2020 06/16/2020 10/29/2019 10/11/2019  Falls in the past year? 0 0 0 0 0  Comment - - Emmi Telephone Survey: data to providers prior to load - Emmi Telephone Survey: data to providers prior to load  Number falls in past yr: 0 0 - 0 -  Injury with Fall? 0 0 - 0 -  Risk for fall due to : Medication side effect - - Medication side effect -  Follow up Falls evaluation completed;Falls prevention discussed - - Falls evaluation completed;Falls prevention discussed -    FALL RISK PREVENTION PERTAINING TO THE HOME:  Any stairs in or around the home? Yes  If so, are there any without handrails? No  Home free of loose throw rugs in walkways, pet beds, electrical cords, etc? Yes  Adequate lighting in your home to reduce risk of falls? Yes  ASSISTIVE DEVICES UTILIZED TO PREVENT FALLS:  Life alert? No  Use of a cane, walker or w/c? No  Grab bars in the bathroom? Yes   Shower chair or bench in shower? No  Elevated toilet seat or a handicapped toilet? No   TIMED UP AND GO:  Was the test performed? N/A telephone visit .    Cognitive Function: MMSE - Mini Mental State Exam 12/04/2020 10/29/2019  Orientation to time 5 5  Orientation to Place 5 5  Registration 3 3  Attention/ Calculation 5 5  Recall 3 3  Language- repeat 1 1  Mini Cog  Mini-Cog screen was completed. Maximum score is 22. A value of 0 denotes this part of the MMSE was not completed or the patient failed this part of the Mini-Cog screening.       Immunizations Immunization History  Administered Date(s) Administered  . Fluad Quad(high Dose 65+) 09/08/2020  . Influenza Split 12/01/2011  . Influenza,inj,Quad PF,6+ Mos 10/07/2016, 08/25/2017, 08/23/2018, 08/29/2019  . Influenza-Unspecified 08/30/2014, 08/21/2015  . PFIZER SARS-COV-2 Vaccination 12/27/2019, 01/21/2020, 09/21/2020  . Pneumococcal Conjugate-13 10/01/2018  . Pneumococcal Polysaccharide-23 11/06/2019  . Tdap 10/16/2007  . Zoster Recombinat (Shingrix) 05/13/2020, 07/14/2020    TDAP status: Due, Education has been provided regarding the importance of this vaccine. Advised may receive this vaccine at local pharmacy or Health Dept. Aware to provide a copy of the vaccination record if obtained from local pharmacy or Health Dept. Verbalized acceptance and understanding.  Flu Vaccine status: Up to date  Pneumococcal vaccine status: Up to date  Covid-19 vaccine status: Completed vaccines  Qualifies for Shingles Vaccine? Yes   Zostavax completed No   Shingrix Completed?: Yes  Screening Tests Health Maintenance  Topic Date Due  . TETANUS/TDAP  12/04/2021 (Originally 10/15/2017)  . COVID-19 Vaccine (4 - Booster for Pfizer series) 03/21/2021  . COLONOSCOPY (Pts 45-41yrs Insurance coverage will need to be confirmed)  04/21/2022  . INFLUENZA VACCINE  Completed  . Hepatitis C Screening  Completed  . PNA vac Low Risk Adult   Completed    Health Maintenance  There are no preventive care reminders to display for this patient.  Colorectal cancer screening: Type of screening: Colonoscopy. Completed 04/22/2015. Repeat every 7 years  Lung Cancer Screening: (Low Dose CT Chest recommended if Age 68-80 years, 30 pack-year currently smoking OR have quit w/in 15years.) does not qualify.    Additional Screening:  Hepatitis C Screening: does qualify; Completed 11/10/2020  Vision Screening: Recommended annual ophthalmology exams for early detection of glaucoma and other disorders of the eye. Is the patient up to date with their annual eye exam?  Yes  Who is the provider or what is the name of the office in which the patient attends annual eye exams? [facility]  If pt is not established with a provider, would they like to be referred to a provider to establish care? No .   Dental Screening: Recommended annual dental exams for proper oral hygiene  Community Resource Referral / Chronic Care Management: CRR required this visit?  No   CCM required this visit?  No      Plan:     I have personally reviewed and noted the following in the patient's chart:   . Medical and social history . Use of alcohol, tobacco or illicit drugs  . Current medications and supplements . Functional ability and status . Nutritional status . Physical activity . Advanced directives . List of other physicians . Hospitalizations,  surgeries, and ER visits in previous 12 months . Vitals . Screenings to include cognitive, depression, and falls . Referrals and appointments  In addition, I have reviewed and discussed with patient certain preventive protocols, quality metrics, and best practice recommendations. A written personalized care plan for preventive services as well as general preventive health recommendations were provided to patient.   Due to this being a telephonic visit, the after visit summary with  patients personalized plan was offered to patient via office or my-chart. Patient preferred to pick up at office at next visit or via mychart.   Andrez Grime, LPN   [id number]

## 2021-01-18 ENCOUNTER — Other Ambulatory Visit: Payer: Self-pay

## 2021-01-18 ENCOUNTER — Ambulatory Visit: Payer: PPO | Admitting: Dermatology

## 2021-01-18 DIAGNOSIS — L820 Inflamed seborrheic keratosis: Secondary | ICD-10-CM | POA: Diagnosis not present

## 2021-01-18 DIAGNOSIS — I781 Nevus, non-neoplastic: Secondary | ICD-10-CM | POA: Diagnosis not present

## 2021-01-18 DIAGNOSIS — L578 Other skin changes due to chronic exposure to nonionizing radiation: Secondary | ICD-10-CM

## 2021-01-18 DIAGNOSIS — L570 Actinic keratosis: Secondary | ICD-10-CM | POA: Diagnosis not present

## 2021-01-18 NOTE — Patient Instructions (Signed)
Cryotherapy Aftercare  . Wash gently with soap and water everyday.   . Apply Vaseline and Band-Aid daily until healed.  

## 2021-01-18 NOTE — Progress Notes (Signed)
Follow-Up Visit   Subjective  Alec Wilson is a 70 y.o. male who presents for the following: Follow-up (2 months f/u hx of Aks on the face, scalp, hands treated with LN2 ). Pt c/o irritated growth on the right wrist will not go away He has several other spots to be checked.  The following portions of the chart were reviewed this encounter and updated as appropriate:   Tobacco  Allergies  Meds  Problems  Med Hx  Surg Hx  Fam Hx     Review of Systems:  No other skin or systemic complaints except as noted in HPI or Assessment and Plan.  Objective  Well appearing patient in no apparent distress; mood and affect are within normal limits.  A focused examination was performed including face, neck, chest and back and face,scalp, hands . Relevant physical exam findings are noted in the Assessment and Plan.  Objective  face, scalp x 4, R hand x 12, L arm/ hand x 19 (35): Erythematous thin papules/macules with gritty scale.   Objective  Right wrist: Erythematous keratotic or waxy stuck-on papule or plaque.    Assessment & Plan  AK (actinic keratosis) (35) face, scalp x 4, R hand x 12, L arm/ hand x 19  Destruction of lesion - face, scalp x 4, R hand x 12, L arm/ hand x 19 Complexity: simple   Destruction method: cryotherapy   Informed consent: discussed and consent obtained   Timeout:  patient name, date of birth, surgical site, and procedure verified Lesion destroyed using liquid nitrogen: Yes   Region frozen until ice ball extended beyond lesion: Yes   Outcome: patient tolerated procedure well with no complications   Post-procedure details: wound care instructions given    Inflamed seborrheic keratosis Right wrist  Destruction of lesion - Right wrist Complexity: simple   Destruction method: cryotherapy   Informed consent: discussed and consent obtained   Timeout:  patient name, date of birth, surgical site, and procedure verified Lesion destroyed using liquid nitrogen:  Yes   Region frozen until ice ball extended beyond lesion: Yes   Outcome: patient tolerated procedure well with no complications   Post-procedure details: wound care instructions given     Severe, 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  The purpose is to decrease the number of clinically evident and subclinical PreCancerous lesions to prevent progression to development of skin cancer by chemically destroying early precancer changes that may or may not be visible.  It has been shown to reduce the risk of developing skin cancer in the treated area. As a result of treatment, redness, scaling, crusting, and open sores may occur during treatment course. One or more than one of these methods may be used and may have to be used several times to control, suppress and eliminate the PreCancerous changes. Discussed treatment course, expected reaction, and possible side effects.  Telangiectasia face - Dilated blood vessel - Benign appearing on exam - Call for changes  Actinic Damage - chronic, secondary to cumulative UV radiation exposure/sun exposure over time - diffuse scaly erythematous macules with underlying dyspigmentation - Recommend daily  broad spectrum sunscreen SPF 30+ to sun-exposed areas, reapply every 2 hours as needed.  - Call for new or changing lesions.  Return in about 3 months (around 04/17/2021).  IMarye Round, CMA, am acting as scribe for Sarina Ser, MD .  Documentation:  I have reviewed the above documentation for accuracy and completeness, and I agree with the above.  Sarina Ser, MD

## 2021-01-19 ENCOUNTER — Encounter: Payer: Self-pay | Admitting: Dermatology

## 2021-04-14 ENCOUNTER — Ambulatory Visit: Payer: PPO | Admitting: Dermatology

## 2021-08-23 ENCOUNTER — Encounter: Payer: Self-pay | Admitting: Dermatology

## 2021-08-23 ENCOUNTER — Other Ambulatory Visit: Payer: Self-pay

## 2021-08-23 ENCOUNTER — Ambulatory Visit: Payer: PPO | Admitting: Dermatology

## 2021-08-23 DIAGNOSIS — L820 Inflamed seborrheic keratosis: Secondary | ICD-10-CM | POA: Diagnosis not present

## 2021-08-23 DIAGNOSIS — L578 Other skin changes due to chronic exposure to nonionizing radiation: Secondary | ICD-10-CM

## 2021-08-23 DIAGNOSIS — L570 Actinic keratosis: Secondary | ICD-10-CM | POA: Diagnosis not present

## 2021-08-23 MED ORDER — FLUOROURACIL 5 % EX CREA: TOPICAL_CREAM | Freq: Two times a day (BID) | CUTANEOUS | 2 refills | Status: DC

## 2021-08-23 NOTE — Progress Notes (Signed)
Follow-Up Visit   Subjective  Alec Wilson is a 70 y.o. male who presents for the following: Follow-up (Patient here today for AK follow up at face,scalp, hands and arms. Patient not aware of any new or changing spots. Patient advises areas treated at last visit with LN2 improved. ).  Patient was seen as a new patient and had an UBSE last December.   The following portions of the chart were reviewed this encounter and updated as appropriate:   Tobacco  Allergies  Meds  Problems  Med Hx  Surg Hx  Fam Hx     Review of Systems:  No other skin or systemic complaints except as noted in HPI or Assessment and Plan.  Objective  Well appearing patient in no apparent distress; mood and affect are within normal limits.  A focused examination was performed including face, scalp, arms, hands. Relevant physical exam findings are noted in the Assessment and Plan.  face, ears, arms, hands (19) Erythematous thin papules/macules with gritty scale.   hands, wrists (2) Erythematous keratotic or waxy stuck-on papule or plaque.    Assessment & Plan  AK (actinic keratosis) (19) face, ears, arms, hands  Destruction of lesion - face, ears, arms, hands Complexity: simple   Destruction method: cryotherapy   Informed consent: discussed and consent obtained   Timeout:  patient name, date of birth, surgical site, and procedure verified Lesion destroyed using liquid nitrogen: Yes   Region frozen until ice ball extended beyond lesion: Yes   Outcome: patient tolerated procedure well with no complications   Post-procedure details: wound care instructions given    fluorouracil (EFUDEX) 5 % cream - face, ears, arms, hands Apply topically 2 (two) times daily. Apply twice daily x 1 week as directed.  Inflamed seborrheic keratosis hands, wrists  Destruction of lesion - hands, wrists Complexity: simple   Destruction method: cryotherapy   Informed consent: discussed and consent obtained   Timeout:   patient name, date of birth, surgical site, and procedure verified Lesion destroyed using liquid nitrogen: Yes   Region frozen until ice ball extended beyond lesion: Yes   Outcome: patient tolerated procedure well with no complications   Post-procedure details: wound care instructions given    Actinic Damage - Severe, confluent actinic changes with pre-cancerous actinic keratoses  - Severe, chronic, not at goal, secondary to cumulative UV radiation exposure over time - diffuse scaly erythematous macules and papules with underlying dyspigmentation - Discussed Prescription "Field Treatment" for Severe, Chronic Confluent Actinic Changes with Pre-Cancerous Actinic Keratoses Field treatment involves treatment of an entire area of skin that has confluent Actinic Changes (Sun/ Ultraviolet light damage) and PreCancerous Actinic Keratoses by method of PhotoDynamic Therapy (PDT) and/or prescription Topical Chemotherapy agents such as 5-fluorouracil, 5-fluorouracil/calcipotriene, and/or imiquimod.  The purpose is to decrease the number of clinically evident and subclinical PreCancerous lesions to prevent progression to development of skin cancer by chemically destroying early precancer changes that may or may not be visible.  It has been shown to reduce the risk of developing skin cancer in the treated area. As a result of treatment, redness, scaling, crusting, and open sores may occur during treatment course. One or more than one of these methods may be used and may have to be used several times to control, suppress and eliminate the PreCancerous changes. Discussed treatment course, expected reaction, and possible side effects. - Recommend daily broad spectrum sunscreen SPF 30+ to sun-exposed areas, reapply every 2 hours as needed.  - Staying  in the shade or wearing long sleeves, sun glasses (UVA+UVB protection) and wide brim hats (4-inch brim around the entire circumference of the hat) are also recommended. -  Call for new or changing lesions. - Consider PDT to arms and hands  Start 5-fluorouracil/calcipotriene cream twice a day for 7 days to affected areas including scalp, 1 month later treat temples and cheeks. Prescription sent to [facility]. Patient provided with contact information for pharmacy and advised the pharmacy will mail the prescription to their home. Patient provided with handout reviewing treatment course and side effects and advised to call or message [place] on MyChart with any concerns.  Return for 4-6 month AK follow up.  Graciella Belton, RMA, am acting as scribe for Sarina Ser, MD . Documentation: I have reviewed the above documentation for accuracy and completeness, and I agree with the above.  Sarina Ser, MD

## 2021-08-23 NOTE — Patient Instructions (Addendum)
- Start 5-fluorouracil/calcipotriene cream twice a day for 7 days to affected areas including scalp, 1 month later treat temples and cheeks. Prescription sent to [facility]. Patient advised they will receive an email to purchase the medication online and have it sent to their home. Patient provided with handout reviewing treatment course and side effects and advised to call or message [place] on MyChart with any concerns.  Cryotherapy Aftercare  Wash gently with soap and water everyday.   Apply Vaseline and Band-Aid daily until healed.   Recommend daily broad spectrum sunscreen SPF 30+ to sun-exposed areas, reapply every 2 hours as needed. Call for new or changing lesions.  Staying in the shade or wearing long sleeves, sun glasses (UVA+UVB protection) and wide brim hats (4-inch brim around the entire circumference of the hat) are also recommended for sun protection.   If you have any questions or concerns for your doctor, please call our main line at [phone] and press option 4 to reach your doctor's medical assistant. If no one answers, please leave a voicemail as directed and we will return your call as soon as possible. Messages left after 4 pm will be answered the following business day.   You may also send [place] a message via [place]. We typically respond to MyChart messages within 1-2 business days.  For prescription refills, please ask your pharmacy to contact our office. Our fax number is [phone].  If you have an urgent issue when the clinic is closed that cannot wait until the next business day, you can page your doctor at the number below.    Please note that while we do our best to be available for urgent issues outside of office hours, we are not available 24/7.   If you have an urgent issue and are unable to reach [place], you may choose to seek medical care at your doctor's office, retail clinic, urgent care center, or emergency room.  If you have a medical  emergency, please immediately call 911 or go to the emergency department.  Pager Numbers  - Dr. Nehemiah Massed: [phone]  - Dr. Laurence Ferrari: [phone]  - Dr. Nicole Kindred: [phone]  In the event of inclement weather, please call our main line at [phone] for an update on the status of any delays or closures.  Dermatology Medication Tips: Please keep the boxes that topical medications come in in order to help keep track of the instructions about where and how to use these. Pharmacies typically print the medication instructions only on the boxes and not directly on the medication tubes.   If your medication is too expensive, please contact our office at [phone] option 4 or send [place] a message through [place].   We are unable to tell what your co-pay for medications will be in advance as this is different depending on your insurance coverage. However, we may be able to find a substitute medication at lower cost or fill out paperwork to get insurance to cover a needed medication.   If a prior authorization is required to get your medication covered by your insurance company, please allow [place] 1-2 business days to complete this process.  Drug prices often vary depending on where the prescription is filled and some pharmacies may offer cheaper prices.  The website www.goodrx.com contains coupons for medications through different pharmacies. The prices here do not account for what the cost may be with help from insurance (it may be cheaper with your insurance), but the website can give you the  price if you did not use any insurance.  - You can print the associated coupon and take it with your prescription to the pharmacy.  - You may also stop by our office during regular business hours and pick up a GoodRx coupon card.  - If you need your prescription sent electronically to a different pharmacy, notify our office through [facility] or by phone at [phone] option 4.

## 2021-09-08 ENCOUNTER — Other Ambulatory Visit: Payer: Self-pay

## 2021-09-08 ENCOUNTER — Telehealth (INDEPENDENT_AMBULATORY_CARE_PROVIDER_SITE_OTHER): Payer: PPO | Admitting: Nurse Practitioner

## 2021-09-08 VITALS — BP 126/78 | HR 68 | Temp 96.6°F

## 2021-09-08 DIAGNOSIS — J029 Acute pharyngitis, unspecified: Secondary | ICD-10-CM | POA: Diagnosis not present

## 2021-09-08 HISTORY — DX: Acute pharyngitis, unspecified: J02.9

## 2021-09-08 LAB — POCT RAPID STREP A (OFFICE): Rapid Strep A Screen: NEGATIVE

## 2021-09-08 NOTE — Progress Notes (Signed)
Patient ID: Alec Wilson, male    DOB: 04/30/1951, 70 y.o.   MRN: [id number]  Virtual visit completed through [place], a video enabled telemedicine application. Due to national recommendations of social distancing due to COVID-19, a virtual visit is felt to be most appropriate for this patient at this time. Reviewed limitations, risks, security and privacy concerns of performing a virtual visit and the availability of in person appointments. I also reviewed that there may be a patient responsible charge related to this service. The patient agreed to proceed.   Patient location: home Provider location: [place] at [facility], office Persons participating in this virtual visit: patient, provider   If any vitals were documented, they were collected by patient at home unless specified below.    BP 126/78   Pulse 68   Temp (!) 96.6 F (35.9 C)    CC: Sore throat Subjective:   HPI: Alec Wilson is a 70 y.o. male presenting on 09/08/2021 for Sore Throat (Started on 09/05/21. Started with scratchy throat and slight headache, then progressed to having sore throat sensation and now improving a lot, slight scratchy feeling left. Covid test negative on 10/17 and 09/07/21 )    Symptoms started on 09/05/2021 At home test was negative Monday and Wednesday Covid vaccine [employer] x2 and 2 booster Has used Ibuprofen that helped No strep as adult. No sick contacts    Relevant past medical, surgical, family and social history reviewed and updated as indicated. Interim medical history since our last visit reviewed. Allergies and medications reviewed and updated. Outpatient Medications Prior to Visit  Medication Sig Dispense Refill   aspirin 81 MG tablet Take 81 mg by mouth daily.     fish oil-omega-3 fatty acids 1000 MG capsule Take 1 g by mouth daily.     losartan (COZAAR) 25 MG tablet TAKE 1 TABLET BY MOUTH EVERY DAY FOR BLOOD PRESSURE 90 tablet 3   Multiple Vitamins-Minerals  (MULTIVITAMIN PO) Take 1 Centrum Silver multivitamin by mouth daily     simvastatin (ZOCOR) 20 MG tablet TAKE 1 TABLET BY MOUTH EVERY DAY IN THE EVENING FOR CHOLESTEROL 90 tablet 3   fluorouracil (EFUDEX) 5 % cream Apply topically 2 (two) times daily. Apply twice daily x 1 week as directed. 15 g 2   No facility-administered medications prior to visit.     Per HPI unless specifically indicated in ROS section below Review of Systems  Constitutional:  Positive for fatigue. Negative for chills and fever.  HENT:  Positive for sore throat. Negative for congestion, ear discharge, ear pain and sinus pain.   Respiratory:  Negative for cough and shortness of breath.   Cardiovascular:  Negative for chest pain.  Gastrointestinal:  Negative for diarrhea, nausea and vomiting.  Musculoskeletal:  Negative for arthralgias and myalgias.  Neurological:  Positive for headaches.  Objective:  BP 126/78   Pulse 68   Temp (!) 96.6 F (35.9 C)   Wt Readings from Last 3 Encounters:  11/10/20 181 lb (82.1 kg)  07/14/20 179 lb 12 oz (81.5 kg)  01/01/20 184 lb (83.5 kg)       Physical exam: Gen: alert, NAD, not ill appearing Pulm: speaks in complete sentences without increased work of breathing Psych: normal mood, normal thought content      Results for orders placed or performed in visit on 11/10/20  Hepatitis C antibody  Result Value Ref Range   Hepatitis C Ab NON-REACTIVE NON-REACTI   SIGNAL TO CUT-OFF 0.00 <  1.00  CBC  Result Value Ref Range   WBC 4.5 4.0 - 10.5 K/uL   RBC 4.61 4.22 - 5.81 Mil/uL   Platelets 258.0 150.0 - 400.0 K/uL   Hemoglobin 14.7 13.0 - 17.0 g/dL   HCT 43.2 39.0 - 52.0 %   MCV 93.7 78.0 - 100.0 fl   MCHC 33.9 30.0 - 36.0 g/dL   RDW 13.2 11.5 - 15.5 %  Comprehensive metabolic panel  Result Value Ref Range   Sodium 137 135 - 145 mEq/L   Potassium 5.0 3.5 - 5.1 mEq/L   Chloride 103 96 - 112 mEq/L   CO2 30 19 - 32 mEq/L   Glucose, Bld 94 70 - 99 mg/dL   BUN 14 6 - 23  mg/dL   Creatinine, Ser 0.79 0.40 - 1.50 mg/dL   Total Bilirubin 0.7 0.2 - 1.2 mg/dL   Alkaline Phosphatase 91 39 - 117 U/L   AST 15 0 - 37 U/L   ALT 19 0 - 53 U/L   Total Protein 6.6 6.0 - 8.3 g/dL   Albumin 4.1 3.5 - 5.2 g/dL   GFR 92.07 >60.00 mL/min   Calcium 9.4 8.4 - 10.5 mg/dL  Lipid panel  Result Value Ref Range   Cholesterol 161 0 - 200 mg/dL   Triglycerides 128.0 0.0 - 149.0 mg/dL   HDL 77.20 >39.00 mg/dL   VLDL 25.6 0.0 - 40.0 mg/dL   LDL Cholesterol 58 0 - 99 mg/dL   Total CHOL/HDL Ratio 2    NonHDL 83.54   PSA, Medicare  Result Value Ref Range   PSA 1.45 0.10 - 4.00 ng/ml   Assessment & Plan:   Problem List Items Addressed This Visit       Other   Sore throat - Primary    Throat.  It is [place] and almost resolved per patient report that he is got to be seeing his 37-month-old grandchild this weekend wants to make sure.  We will strep test patient to make sure he does not have strep no other symptoms has tested for COVID twice at home both negative pending lab result      Relevant Orders   Rapid Strep A     No orders of the defined types were placed in this encounter.  No orders of the defined types were placed in this encounter.   I discussed the assessment and treatment plan with the patient. The patient was provided an opportunity to ask questions and all were answered. The patient agreed with the plan and demonstrated an understanding of the instructions. The patient was advised to call back or seek an in-person evaluation if the symptoms worsen or if the condition fails to improve as anticipated.  Follow up plan: No follow-ups on file.  Romilda Garret, NP

## 2021-09-08 NOTE — Assessment & Plan Note (Signed)
Throat.  It is [place] and almost resolved per patient report that he is got to be seeing his 62-month-old grandchild this weekend wants to make sure.  We will strep test patient to make sure he does not have strep no other symptoms has tested for COVID twice at home both negative pending lab result

## 2021-09-08 NOTE — Addendum Note (Signed)
Addended by: Kris Mouton on: 09/08/2021 04:27 PM   Modules accepted: Orders

## 2021-10-19 DIAGNOSIS — H524 Presbyopia: Secondary | ICD-10-CM | POA: Diagnosis not present

## 2021-10-29 ENCOUNTER — Other Ambulatory Visit: Payer: Self-pay | Admitting: Primary Care

## 2021-10-29 DIAGNOSIS — I10 Essential (primary) hypertension: Secondary | ICD-10-CM

## 2021-10-29 DIAGNOSIS — E785 Hyperlipidemia, unspecified: Secondary | ICD-10-CM

## 2021-11-23 ENCOUNTER — Other Ambulatory Visit: Payer: Self-pay | Admitting: Primary Care

## 2021-11-23 DIAGNOSIS — E785 Hyperlipidemia, unspecified: Secondary | ICD-10-CM

## 2021-11-23 DIAGNOSIS — I10 Essential (primary) hypertension: Secondary | ICD-10-CM

## 2021-11-23 NOTE — Telephone Encounter (Signed)
Overdue for CPE/office visit. Has already been notified once on last two Rx's.  Please have him scheduled.

## 2021-12-01 NOTE — Telephone Encounter (Signed)
Appointment made for 12/07/21

## 2021-12-03 NOTE — Progress Notes (Signed)
Subjective:   Alec Wilson is a 71 y.o. male who presents for Medicare Annual/Subsequent preventive examination.  I connected with Alec Wilson today by telephone and verified that I am speaking with the correct person using two identifiers. Location patient: home Location provider: work Persons participating in the virtual visit: patient, Marine scientist.    I discussed the limitations, risks, security and privacy concerns of performing an evaluation and management service by telephone and the availability of in person appointments. I also discussed with the patient that there may be a patient responsible charge related to this service. The patient expressed understanding and verbally consented to this telephonic visit.    Interactive audio and video telecommunications were attempted between this provider and patient, however failed, due to patient having technical difficulties OR patient did not have access to video capability.  We continued and completed visit with audio only.  Some vital signs may be absent or patient reported.   Time Spent with patient on telephone encounter: 20 minutes  Review of Systems     Cardiac Risk Factors include: advanced age (>82men, >78 women);hypertension;dyslipidemia     Objective:    Today's Vitals   12/06/21 0815  Weight: 181 lb (82.1 kg)  Height: 5\' 8"  (1.727 m)   Body mass index is 27.52 kg/m.  Advanced Directives 12/06/2021 12/04/2020 10/29/2019 04/22/2015 04/08/2015  Does Patient Have a Medical Advance Directive? Yes Yes Yes Yes Yes  Type of Paramedic of [place];Living will [place];Living will [place];Living will Healthcare Power of [place];Living will  Does patient want to make changes to medical advance directive? Yes (MAU/Ambulatory/Procedural Areas - Information given) - - - -  Copy of [place] in Chart? - No - copy requested No -  copy requested - -    Current Medications (verified) Outpatient Encounter Medications as of 12/06/2021  Medication Sig   aspirin 81 MG tablet Take 81 mg by mouth daily.   fish oil-omega-3 fatty acids 1000 MG capsule Take 1 g by mouth daily.   losartan (COZAAR) 25 MG tablet TAKE 1 TABLET BY MOUTH DAILY FOR BLOOD PRESSURE OFFICE VISIT REQUIRED FOR FURTHER REFILLS.   Multiple Vitamins-Minerals (MULTIVITAMIN PO) Take 1 Centrum Silver multivitamin by mouth daily   simvastatin (ZOCOR) 20 MG tablet TAKE 1 TABLET DAILY IN EVENING FOR CHOLESTEROL NEED OFFICE VISIT FOR REFILLS   No facility-administered encounter medications on file as of 12/06/2021.    Allergies (verified) Patient has no known allergies.   History: Past Medical History:  Diagnosis Date   Adenomatous colon polyp    last colonoscopy was 3 years ago.   Anemia    Diverticulosis    Hemorrhoids summer 2005   s/p ligation of internal hemorrhoids   HTN (hypertension)    Hyperlipidemia    Inguinal hernia    PAF (paroxysmal atrial fibrillation) ([place]) 1999   Stress ECHO-Negative (09/22/98) -> no further documented evidence of A. fib   RMS[facility] spotted fever) July 2005   Past Surgical History:  Procedure Laterality Date   CARDIAC EVENT MONITOR  10/2019   Baseline sample showed sinus rhythm with average rate 79 bpm.  Minimum heart rate 48 bpm.  Maximum heart rate sinus tachycardia 159 bpm. <1% PVCs.  No A. fib.  Bradycardia was noted during sleeping hours.  Patient had one episode of sensation of fluttering or skipped beats during which she had a short run of sinus rhythm  with PVCs in bigeminy.   COLONOSCOPY  2013   HEMORRHOID SURGERY  2005   INGUINAL HERNIA REPAIR Right 2019   NM MYOVIEW LTD  11/2019   EF 60%..  Medium size, mild severity fixed apical defect with normal wall motion consistent with artifact.  LOW Risk   THROAT SURGERY  childhood   TRANSTHORACIC ECHOCARDIOGRAM  12/04/2019    EF 60 to 65%.  No R WMA.   Mild basal septal hypertrophy.  Normal RV function but mildly increased size.  Normal atrial sizes.  Mild aortic sclerosis, no stenosis.   VASECTOMY     Family History  Problem Relation Age of Onset   Coronary artery disease Father 37       CABG at age 21, MI at 51.   Parkinson's disease Father    Colon polyps Father    Congestive Heart Failure Father    Hypertension Brother    Hypertension Mother    Arthritis Mother    Heart attack Mother 36   Cancer Maternal Grandmother    Cancer Maternal Grandfather    Heart attack Paternal Grandfather 75   Rectal cancer Neg Hx    Stomach cancer Neg Hx    Colon cancer Neg Hx    Esophageal cancer Neg Hx    Social History   Socioeconomic History   Marital status: Married    Spouse name: Not on file   Number of children: Not on file   Years of education: Not on file   Highest education level: Not on file  Occupational History   Not on file  Tobacco Use   Smoking status: Former    Packs/day: 1.00    Types: Cigarettes   Smokeless tobacco: Former    Quit date: 02/19/1998  Substance and Sexual Activity   Alcohol use: Yes    Alcohol/week: 2.0 standard drinks    Types: 2 Glasses of wine per week    Comment: weekly   Drug use: No   Sexual activity: Yes    Birth control/protection: Surgical  Other Topics Concern   Not on file  Social History Narrative   From [place].   Works at [employer] in [employer].   Married.  2 children: 39, 27.    Daughters live in [place] and [place].   Enjoys golfing, visiting vineyards, traveling --> exercises 6 days a week 1 walking and playing golf      Quit smoking in 1999.   Drinks roughly 5 glasses of wine a week.   Social Determinants of Health   Financial Resource Strain: Low Risk    Difficulty of Paying Living Expenses: Not hard at all  Food Insecurity: No Food Insecurity   Worried About Charity fundraiser in the Last Year: Never true   [place] in the Last Year: Never true   Transportation Needs: No Transportation Needs   Lack of Transportation (Medical): No   Lack of Transportation (Non-Medical): No  Physical Activity: Sufficiently Active   Days of Exercise per Week: 7 days   Minutes of Exercise per Session: 60 min  Stress: No Stress Concern Present   Feeling of Stress : Not at all  Social Connections: Moderately Integrated   Frequency of Communication with Friends and Family: More than three times a week   Frequency of Social Gatherings with Friends and Family: More than three times a week   Attends Religious Services: More than 4 times per year   Active Member  of Clubs or Organizations: No   Attends Archivist Meetings: Never   Marital Status: Married    Tobacco Counseling Counseling given: Not Answered   Clinical Intake:  Pre-visit preparation completed: Yes  Pain : No/denies pain     BMI - recorded: 27.52 Nutritional Status: BMI 25 -29 Overweight Nutritional Risks: None Diabetes: No  How often do you need to have someone help you when you read instructions, pamphlets, or other written materials from your doctor or pharmacy?: 1 - Never  Diabetic? No  Interpreter Needed?: No  Information entered by :: Orrin Brigham LPN   Activities of Daily Living In your present state of health, do you have any difficulty performing the following activities: 12/06/2021  Hearing? N  Vision? Y  Difficulty concentrating or making decisions? N  Walking or climbing stairs? N  Dressing or bathing? N  Doing errands, shopping? N  Preparing Food and eating ? N  Using the Toilet? N  In the past six months, have you accidently leaked urine? N  Do you have problems with loss of bowel control? N  Managing your Medications? N  Managing your Finances? N  Housekeeping or managing your Housekeeping? N  Some recent data might be hidden    Patient Care Team: Pleas Koch, NP as PCP - General (Nurse Practitioner) Gatha Mayer, MD  (Gastroenterology) Romeo Apple, MD (Inactive) (Cardiology)  Indicate any recent Medical Services you may have received from other than [facility] providers in the past year (date may be approximate).     Assessment:   This is a routine wellness examination for [place].  Hearing/Vision screen Hearing Screening - Comments:: No issues Vision Screening - Comments:: Last exam 09/2021, [place], wears glasses  Dietary issues and exercise activities discussed: Current Exercise Habits: Home exercise routine, Type of exercise: walking, Time (Minutes): 60, Frequency (Times/Week): 7, Weekly Exercise (Minutes/Week): 420, Intensity: Intense   Goals Addressed             This Visit's Progress    Patient Stated       Would like to lose 5lbs       Depression Screen PHQ 2/9 Scores 12/06/2021 12/04/2020 11/10/2020 10/29/2019 10/01/2018 08/30/2017  PHQ - 2 Score 0 0 0 0 0 0  PHQ- 9 Score - 0 0 0 - -    Fall Risk Fall Risk  12/06/2021 12/04/2020 11/10/2020 06/16/2020 10/29/2019  Falls in the past year? 0 0 0 0 0  Comment - - - Emmi Telephone Survey: data to providers prior to load -  Number falls in past yr: 0 0 0 - 0  Injury with Fall? 0 0 0 - 0  Risk for fall due to : - Medication side effect - - Medication side effect  Follow up Falls prevention discussed Falls evaluation completed;Falls prevention discussed - - Falls evaluation completed;Falls prevention discussed    FALL RISK PREVENTION PERTAINING TO THE HOME:  Any stairs in or around the home? Yes  If so, are there any without handrails? No  Home free of loose throw rugs in walkways, pet beds, electrical cords, etc? Yes  Adequate lighting in your home to reduce risk of falls? Yes   ASSISTIVE DEVICES UTILIZED TO PREVENT FALLS:  Life alert? No  Use of a cane, walker or w/c? No  Grab bars in the bathroom? Yes  Shower chair or bench in shower? No  Elevated toilet seat or a handicapped toilet? No   TIMED UP AND GO:  Was the test  performed? No .     Cognitive Function: Normal cognitive status assessed by this Nurse Health Advisor. No abnormalities found.   MMSE - Mini Mental State Exam 12/04/2020 10/29/2019  Orientation to time 5 5  Orientation to Place 5 5  Registration 3 3  Attention/ Calculation 5 5  Recall 3 3  Language- repeat 1 1        Immunizations Immunization History  Administered Date(s) Administered   Fluad Quad(high Dose 65+) 09/08/2020, 08/24/2021   Influenza Split 12/01/2011   Influenza,inj,Quad PF,6+ Mos 10/07/2016, 08/25/2017, 08/23/2018, 08/29/2019   Influenza-Unspecified 08/30/2014, 08/21/2015   PFIZER(Purple Top)SARS-COV-2 Vaccination 12/27/2019, 01/21/2020, 09/21/2020   Pfizer Covid-19 Vaccine Bivalent Booster 32yrs & up 09/21/2021   Pneumococcal Conjugate-13 10/01/2018   Pneumococcal Polysaccharide-23 11/06/2019   Tdap 10/16/2007   Zoster Recombinat (Shingrix) 05/13/2020, 07/14/2020    TDAP status: Due, Education has been provided regarding the importance of this vaccine. Advised may receive this vaccine at local pharmacy or Health Dept. Aware to provide a copy of the vaccination record if obtained from local pharmacy or Health Dept. Verbalized acceptance and understanding.  Flu Vaccine status: Up to date  Pneumococcal vaccine status: Up to date  Covid-19 vaccine status: Completed vaccines  Qualifies for Shingles Vaccine? Yes   Zostavax completed No   Shingrix Completed?: Yes  Screening Tests Health Maintenance  Topic Date Due   TETANUS/TDAP  10/15/2017   COLONOSCOPY (Pts 45-65yrs Insurance coverage will need to be confirmed)  04/21/2022   Pneumonia Vaccine 61+ Years old  Completed   INFLUENZA VACCINE  Completed   COVID-19 Vaccine  Completed   Hepatitis C Screening  Completed   Zoster Vaccines- Shingrix  Completed   HPV VACCINES  Aged Out    Health Maintenance  Health Maintenance Due  Topic Date Due   TETANUS/TDAP  10/15/2017    Colorectal cancer screening:  Type of screening: Colonoscopy. Completed 04/22/15. Repeat every 7 years  Lung Cancer Screening: (Low Dose CT Chest recommended if Age 43-80 years, 30 pack-year currently smoking OR have quit w/in 15years.) does not qualify.     Additional Screening:  Hepatitis C Screening: does qualify; Completed 11/10/21  Vision Screening: Recommended annual ophthalmology exams for early detection of glaucoma and other disorders of the eye. Is the patient up to date with their annual eye exam?  Yes  Who is the provider or what is the name of the office in which the patient attends annual eye exams? Provider information unavailable   Dental Screening: Recommended annual dental exams for proper oral hygiene  Community Resource Referral / Chronic Care Management: CRR required this visit?  No   CCM required this visit?  No      Plan:     I have personally reviewed and noted the following in the patients chart:   Medical and social history Use of alcohol, tobacco or illicit drugs  Current medications and supplements including opioid prescriptions. Patient is not currently taking opioid prescriptions. Functional ability and status Nutritional status Physical activity Advanced directives List of other physicians Hospitalizations, surgeries, and ER visits in previous 12 months Vitals Screenings to include cognitive, depression, and falls Referrals and appointments  In addition, I have reviewed and discussed with patient certain preventive protocols, quality metrics, and best practice recommendations. A written personalized care plan for preventive services as well as general preventive health recommendations were provided to patient.   Due to this being a telephonic visit, the after visit summary with patients  personalized plan was offered to patient via mail or my-chart. Patient would like to access on my-chart.     Loma Messing, LPN   06/18/210   Nurse Health Advisor  Nurse Notes:  none

## 2021-12-06 ENCOUNTER — Ambulatory Visit (INDEPENDENT_AMBULATORY_CARE_PROVIDER_SITE_OTHER): Payer: PPO

## 2021-12-06 VITALS — Ht 68.0 in | Wt 181.0 lb

## 2021-12-06 DIAGNOSIS — Z0000 Encounter for general adult medical examination without abnormal findings: Secondary | ICD-10-CM

## 2021-12-06 NOTE — Patient Instructions (Addendum)
Alec Wilson , Thank you for taking time to complete your Medicare Wellness Visit. I appreciate your ongoing commitment to your health goals. Please review the following plan we discussed and let me know if I can assist you in the future.   Screening recommendations/referrals: Colonoscopy: up to date, completed 05/02/15, due 05/01/22 Recommended yearly ophthalmology/optometry visit for glaucoma screening and checkup Recommended yearly dental visit for hygiene and checkup  Vaccinations: Influenza vaccine: up to date Pneumococcal vaccine: up to date Tdap vaccine: due, last completed 10/16/07, discuss with your local pharmacy  Shingles vaccine: up to date    Covid-19: up to date  Advanced directives: Please bring a copy of Living Will and/or [place] for your chart.   Conditions/risks identified: see problem list  Next appointment: Follow up in one year for your annual wellness visit. 12/07/22 @ 9:45am, this will be a telephone visit  Preventive Care 69 Years and Older, Male Preventive care refers to lifestyle choices and visits with your health care provider that can promote health and wellness. What does preventive care include? A yearly physical exam. This is also called an annual well check. Dental exams once or twice a year. Routine eye exams. Ask your health care provider how often you should have your eyes checked. Personal lifestyle choices, including: Daily care of your teeth and gums. Regular physical activity. Eating a healthy diet. Avoiding tobacco and drug use. Limiting alcohol use. Practicing safe sex. Taking low doses of aspirin every day. Taking vitamin and mineral supplements as recommended by your health care provider. What happens during an annual well check? The services and screenings done by your health care provider during your annual well check will depend on your age, overall health, lifestyle risk factors, and family history of  disease. Counseling  Your health care provider may ask you questions about your: Alcohol use. Tobacco use. Drug use. Emotional well-being. Home and relationship well-being. Sexual activity. Eating habits. History of falls. Memory and ability to understand (cognition). Work and work Statistician. Screening  You may have the following tests or measurements: Height, weight, and BMI. Blood pressure. Lipid and cholesterol levels. These may be checked every 5 years, or more frequently if you are over 13 years old. Skin check. Lung cancer screening. You may have this screening every year starting at age 63 if you have a 30-pack-year history of smoking and currently smoke or have quit within the past 15 years. Fecal occult blood test (FOBT) of the stool. You may have this test every year starting at age 58. Flexible sigmoidoscopy or colonoscopy. You may have a sigmoidoscopy every 5 years or a colonoscopy every 10 years starting at age 42. Prostate cancer screening. Recommendations will vary depending on your family history and other risks. Hepatitis C blood test. Hepatitis B blood test. Sexually transmitted disease (STD) testing. Diabetes screening. This is done by checking your blood sugar (glucose) after you have not eaten for a while (fasting). You may have this done every 1-3 years. Abdominal aortic aneurysm (AAA) screening. You may need this if you are a current or former smoker. Osteoporosis. You may be screened starting at age 64 if you are at high risk. Talk with your health care provider about your test results, treatment options, and if necessary, the need for more tests. Vaccines  Your health care provider may recommend certain vaccines, such as: Influenza vaccine. This is recommended every year. Tetanus, diphtheria, and acellular pertussis (Tdap, Td) vaccine. You may need a Td  booster every 10 years. Zoster vaccine. You may need this after age 71. Pneumococcal 13-valent  conjugate (PCV13) vaccine. One dose is recommended after age 65. Pneumococcal polysaccharide (PPSV23) vaccine. One dose is recommended after age 28. Talk to your health care provider about which screenings and vaccines you need and how often you need them. This information is not intended to replace advice given to you by your health care provider. Make sure you discuss any questions you have with your health care provider. Document Released: 12/04/2015 Document Revised: 07/27/2016 Document Reviewed: 09/08/2015 Elsevier Interactive Patient Education  2017 [place] Prevention in the Home Falls can cause injuries. They can happen to people of all ages. There are many things you can do to make your home safe and to help prevent falls. What can I do on the outside of my home? Regularly fix the edges of walkways and driveways and fix any cracks. Remove anything that might make you trip as you walk through a door, such as a raised step or threshold. Trim any bushes or trees on the path to your home. Use bright outdoor lighting. Clear any walking paths of anything that might make someone trip, such as rocks or tools. Regularly check to see if handrails are loose or broken. Make sure that both sides of any steps have handrails. Any raised decks and porches should have guardrails on the edges. Have any leaves, snow, or ice cleared regularly. Use sand or salt on walking paths during winter. Clean up any spills in your garage right away. This includes oil or grease spills. What can I do in the bathroom? Use night lights. Install grab bars by the toilet and in the tub and shower. Do not use towel bars as grab bars. Use non-skid mats or decals in the tub or shower. If you need to sit down in the shower, use a plastic, non-slip stool. Keep the floor dry. Clean up any water that spills on the floor as soon as it happens. Remove soap buildup in the tub or shower regularly. Attach bath mats  securely with double-sided non-slip rug tape. Do not have throw rugs and other things on the floor that can make you trip. What can I do in the bedroom? Use night lights. Make sure that you have a light by your bed that is easy to reach. Do not use any sheets or blankets that are too big for your bed. They should not hang down onto the floor. Have a firm chair that has side arms. You can use this for support while you get dressed. Do not have throw rugs and other things on the floor that can make you trip. What can I do in the kitchen? Clean up any spills right away. Avoid walking on wet floors. Keep items that you use a lot in easy-to-reach places. If you need to reach something above you, use a strong step stool that has a grab bar. Keep electrical cords out of the way. Do not use floor polish or wax that makes floors slippery. If you must use wax, use non-skid floor wax. Do not have throw rugs and other things on the floor that can make you trip. What can I do with my stairs? Do not leave any items on the stairs. Make sure that there are handrails on both sides of the stairs and use them. Fix handrails that are broken or loose. Make sure that handrails are as long as the stairways. Check any carpeting  to make sure that it is firmly attached to the stairs. Fix any carpet that is loose or worn. Avoid having throw rugs at the top or bottom of the stairs. If you do have throw rugs, attach them to the floor with carpet tape. Make sure that you have a light switch at the top of the stairs and the bottom of the stairs. If you do not have them, ask someone to add them for you. What else can I do to help prevent falls? Wear shoes that: Do not have high heels. Have rubber bottoms. Are comfortable and fit you well. Are closed at the toe. Do not wear sandals. If you use a stepladder: Make sure that it is fully opened. Do not climb a closed stepladder. Make sure that both sides of the stepladder  are locked into place. Ask someone to hold it for you, if possible. Clearly mark and make sure that you can see: Any grab bars or handrails. First and last steps. Where the edge of each step is. Use tools that help you move around (mobility aids) if they are needed. These include: Canes. Walkers. Scooters. Crutches. Turn on the lights when you go into a dark area. Replace any light bulbs as soon as they burn out. Set up your furniture so you have a clear path. Avoid moving your furniture around. If any of your floors are uneven, fix them. If there are any pets around you, be aware of where they are. Review your medicines with your doctor. Some medicines can make you feel dizzy. This can increase your chance of falling. Ask your doctor what other things that you can do to help prevent falls. This information is not intended to replace advice given to you by your health care provider. Make sure you discuss any questions you have with your health care provider. Document Released: 09/03/2009 Document Revised: 04/14/2016 Document Reviewed: 12/12/2014 Elsevier Interactive Patient Education  2017 [employer].

## 2021-12-07 ENCOUNTER — Other Ambulatory Visit: Payer: Self-pay | Admitting: Primary Care

## 2021-12-07 ENCOUNTER — Ambulatory Visit (INDEPENDENT_AMBULATORY_CARE_PROVIDER_SITE_OTHER): Payer: PPO | Admitting: Primary Care

## 2021-12-07 ENCOUNTER — Encounter: Payer: Self-pay | Admitting: Primary Care

## 2021-12-07 ENCOUNTER — Other Ambulatory Visit: Payer: Self-pay

## 2021-12-07 VITALS — BP 124/68 | HR 64 | Temp 98.6°F | Ht 68.0 in | Wt 183.0 lb

## 2021-12-07 DIAGNOSIS — Z86010 Personal history of colonic polyps: Secondary | ICD-10-CM | POA: Diagnosis not present

## 2021-12-07 DIAGNOSIS — Z0001 Encounter for general adult medical examination with abnormal findings: Secondary | ICD-10-CM

## 2021-12-07 DIAGNOSIS — N529 Male erectile dysfunction, unspecified: Secondary | ICD-10-CM | POA: Diagnosis not present

## 2021-12-07 DIAGNOSIS — Z125 Encounter for screening for malignant neoplasm of prostate: Secondary | ICD-10-CM

## 2021-12-07 DIAGNOSIS — M1990 Unspecified osteoarthritis, unspecified site: Secondary | ICD-10-CM | POA: Diagnosis not present

## 2021-12-07 DIAGNOSIS — E782 Mixed hyperlipidemia: Secondary | ICD-10-CM | POA: Diagnosis not present

## 2021-12-07 DIAGNOSIS — E785 Hyperlipidemia, unspecified: Secondary | ICD-10-CM

## 2021-12-07 DIAGNOSIS — I10 Essential (primary) hypertension: Secondary | ICD-10-CM | POA: Diagnosis not present

## 2021-12-07 LAB — COMPREHENSIVE METABOLIC PANEL
ALT: 22 U/L (ref 0–53)
AST: 16 U/L (ref 0–37)
Albumin: 4.3 g/dL (ref 3.5–5.2)
Alkaline Phosphatase: 93 U/L (ref 39–117)
BUN: 11 mg/dL (ref 6–23)
CO2: 29 mEq/L (ref 19–32)
Calcium: 9.5 mg/dL (ref 8.4–10.5)
Chloride: 101 mEq/L (ref 96–112)
Creatinine, Ser: 0.78 mg/dL (ref 0.40–1.50)
GFR: 91.73 mL/min (ref >60.00)
Glucose, Bld: 94 mg/dL (ref 70–99)
Potassium: 5 mEq/L (ref 3.5–5.1)
Sodium: 138 mEq/L (ref 135–145)
Total Bilirubin: 0.8 mg/dL (ref 0.2–1.2)
Total Protein: 6.9 g/dL (ref 6.0–8.3)

## 2021-12-07 LAB — LIPID PANEL
Cholesterol: 181 mg/dL (ref 0–200)
HDL: 83.9 mg/dL (ref >39.00)
LDL Cholesterol: 75 mg/dL (ref 0–99)
NonHDL: 97.32
Total CHOL/HDL Ratio: 2
Triglycerides: 111 mg/dL (ref 0.0–149.0)
VLDL: 22.2 mg/dL (ref 0.0–40.0)

## 2021-12-07 LAB — CBC
HCT: 46.1 % (ref 39.0–52.0)
Hemoglobin: 15.2 g/dL (ref 13.0–17.0)
MCHC: 33 g/dL (ref 30.0–36.0)
MCV: 94 fl (ref 78.0–100.0)
Platelets: 270 10*3/uL (ref 150.0–400.0)
RBC: 4.9 Mil/uL (ref 4.22–5.81)
RDW: 13.4 % (ref 11.5–15.5)
WBC: 4.8 10*3/uL (ref 4.0–10.5)

## 2021-12-07 LAB — PSA, MEDICARE: PSA: 1.86 ng/ml (ref 0.10–4.00)

## 2021-12-07 MED ORDER — SILDENAFIL CITRATE 20 MG PO TABS: ORAL_TABLET | ORAL | 0 refills | Status: DC

## 2021-12-07 NOTE — Assessment & Plan Note (Signed)
New issue for last 5-6 months.  Discussed options, he would like to try sildenafil. Rx for sildenafil 20 mg sent to pharmacy, instructions provided.  Follow up PRN.

## 2021-12-07 NOTE — Assessment & Plan Note (Signed)
Colonoscopy due June 2023. He will notify us if he does not hear from GI.

## 2021-12-07 NOTE — Assessment & Plan Note (Signed)
Chronic, overall stable. No concerns.

## 2021-12-07 NOTE — Assessment & Plan Note (Signed)
Continue simvastatin 20 mg. Repeat lipid panel pending.

## 2021-12-07 NOTE — Progress Notes (Signed)
Subjective:    Patient ID: Alec Wilson, male    DOB: November 05, 1951, 71 y.o.   MRN: [id number]  HPI  Alec Wilson is a very pleasant 71 y.o. male who presents today for complete physical and follow up of chronic conditions.  He would like to discuss erectile dysfunction. Difficulty maintaining an erection, started around Fall 2022. Occurring less frequently in the beginning, more frequently over the last month. He is ready to try medication. He denies urinary symptoms, chest pain, dizziness. No symptoms with intercourse.   Immunizations: -Tetanus: 2008 -Influenza: Completed this season  -Covid-19: 4 vaccines -Shingles: Shingrix completed -Pneumonia: Prevnar 13 in 2019, Pneumovax in 2020   Diet: [place].  Exercise: Exercises, walks a lot.   Eye exam: Completes annually  Dental exam: Due, he will schedule   Colonoscopy: Completed in 2016, due June 2023 PSA: Due  BP Readings from Last 3 Encounters:  12/07/21 124/68  09/08/21 126/78  11/10/20 120/68       Review of Systems  Constitutional:  Negative for unexpected weight change.  HENT:  Negative for rhinorrhea.   Eyes:  Negative for visual disturbance.  Respiratory:  Negative for cough and shortness of breath.   Cardiovascular:  Negative for chest pain.  Gastrointestinal:  Positive for constipation. Negative for diarrhea.  Genitourinary:  Negative for difficulty urinating.  Musculoskeletal:  Negative for arthralgias and myalgias.  Skin:  Negative for rash.  Allergic/Immunologic: Negative for environmental allergies.  Neurological:  Negative for dizziness and headaches.  Psychiatric/Behavioral:  The patient is not nervous/anxious.         Past Medical History:  Diagnosis Date   Adenomatous colon polyp    last colonoscopy was 3 years ago.   Anemia    Diverticulosis    Hemorrhoids summer 2005   s/p ligation of internal hemorrhoids   HTN (hypertension)    Hyperlipidemia    Inguinal hernia    PAF (paroxysmal  atrial fibrillation) ([place]) 1999   Stress ECHO-Negative (09/22/98) -> no further documented evidence of A. fib   RMSF [facility] spotted fever) July 2005    Social History   Socioeconomic History   Marital status: Married    Spouse name: Not on file   Number of children: Not on file   Years of education: Not on file   Highest education level: Not on file  Occupational History   Not on file  Tobacco Use   Smoking status: Former    Packs/day: 1.00    Types: Cigarettes   Smokeless tobacco: Former    Quit date: 02/19/1998  Substance and Sexual Activity   Alcohol use: Yes    Alcohol/week: 2.0 standard drinks    Types: 2 Glasses of wine per week    Comment: weekly   Drug use: No   Sexual activity: Yes    Birth control/protection: Surgical  Other Topics Concern   Not on file  Social History Narrative   From [place].   Works at [employer] in [employer].   Married.  2 children: 39, 27.    Daughters live in [place] and [place].   Enjoys golfing, visiting vineyards, traveling --> exercises 6 days a week 1 walking and playing golf      Quit smoking in 1999.   Drinks roughly 5 glasses of wine a week.   Social Determinants of Health   Financial Resource Strain: Low Risk    Difficulty of Paying Living Expenses: Not hard at all  Food Insecurity: No Food Insecurity   Worried About Charity fundraiser in the Last Year: Never true   Ran Out of Food in the Last Year: Never true  Transportation Needs: No Transportation Needs   Lack of Transportation (Medical): No   Lack of Transportation (Non-Medical): No  Physical Activity: Sufficiently Active   Days of Exercise per Week: 7 days   Minutes of Exercise per Session: 60 min  Stress: No Stress Concern Present   Feeling of Stress : Not at all  Social Connections: Moderately Integrated   Frequency of Communication with Friends and Family: More than three times a week   Frequency of Social Gatherings with Friends and  Family: More than three times a week   Attends Religious Services: More than 4 times per year   Active Member of [employer] or Organizations: No   Attends Archivist Meetings: Never   Marital Status: Married  Human resources officer Violence: Not At Risk   Fear of Current or Ex-Partner: No   Emotionally Abused: No   Physically Abused: No   Sexually Abused: No    Past Surgical History:  Procedure Laterality Date   CARDIAC EVENT MONITOR  10/2019   Baseline sample showed sinus rhythm with average rate 79 bpm.  Minimum heart rate 48 bpm.  Maximum heart rate sinus tachycardia 159 bpm. <1% PVCs.  No A. fib.  Bradycardia was noted during sleeping hours.  Patient had one episode of sensation of fluttering or skipped beats during which she had a short run of sinus rhythm with PVCs in bigeminy.   COLONOSCOPY  2013   HEMORRHOID SURGERY  2005   INGUINAL HERNIA REPAIR Right 2019   NM MYOVIEW LTD  11/2019   EF 60%..  Medium size, mild severity fixed apical defect with normal wall motion consistent with artifact.  LOW Risk   THROAT SURGERY  childhood   TRANSTHORACIC ECHOCARDIOGRAM  12/04/2019    EF 60 to 65%.  No R WMA.  Mild basal septal hypertrophy.  Normal RV function but mildly increased size.  Normal atrial sizes.  Mild aortic sclerosis, no stenosis.   VASECTOMY      Family History  Problem Relation Age of Onset   Coronary artery disease Father 32       CABG at age 41, MI at 67.   Parkinson's disease Father    Colon polyps Father    Congestive Heart Failure Father    Hypertension Brother    Hypertension Mother    Arthritis Mother    Heart attack Mother 27   Cancer Maternal Grandmother    Cancer Maternal Grandfather    Heart attack Paternal Grandfather 75   Rectal cancer Neg Hx    Stomach cancer Neg Hx    Colon cancer Neg Hx    Esophageal cancer Neg Hx     No Known Allergies  Current Outpatient Medications on File Prior to Visit  Medication Sig Dispense Refill   aspirin 81 MG  tablet Take 81 mg by mouth daily.     fish oil-omega-3 fatty acids 1000 MG capsule Take 1 g by mouth daily.     losartan (COZAAR) 25 MG tablet TAKE 1 TABLET BY MOUTH DAILY FOR BLOOD PRESSURE OFFICE VISIT REQUIRED FOR FURTHER REFILLS. 30 tablet 0   Multiple Vitamins-Minerals (MULTIVITAMIN PO) Take 1 Centrum Silver multivitamin by mouth daily     simvastatin (ZOCOR) 20 MG tablet TAKE 1 TABLET DAILY IN EVENING FOR CHOLESTEROL NEED OFFICE VISIT FOR REFILLS  30 tablet 0   No current facility-administered medications on file prior to visit.    BP 124/68    Pulse 64    Temp 98.6 F (37 C) (Temporal)    Ht 5\' 8"  (1.727 m)    Wt 183 lb (83 kg)    SpO2 98%    BMI 27.83 kg/m  Objective:   Physical Exam HENT:     Right Ear: Tympanic membrane and ear canal normal.     Left Ear: Tympanic membrane and ear canal normal.     Nose: Nose normal.     Right Sinus: No maxillary sinus tenderness or frontal sinus tenderness.     Left Sinus: No maxillary sinus tenderness or frontal sinus tenderness.  Eyes:     Conjunctiva/sclera: Conjunctivae normal.  Neck:     Thyroid: No thyromegaly.     Vascular: No carotid bruit.  Cardiovascular:     Rate and Rhythm: Normal rate and regular rhythm.     Heart sounds: Normal heart sounds.  Pulmonary:     Effort: Pulmonary effort is normal.     Breath sounds: Normal breath sounds. No wheezing or rales.  Abdominal:     General: Bowel sounds are normal.     Palpations: Abdomen is soft.     Tenderness: There is no abdominal tenderness.  Musculoskeletal:        General: Normal range of motion.     Cervical back: Neck supple.  Skin:    General: Skin is warm and dry.  Neurological:     Mental Status: He is alert and oriented to person, place, and time.     Cranial Nerves: No cranial nerve deficit.     Deep Tendon Reflexes: Reflexes are normal and symmetric.  Psychiatric:        Mood and Affect: Mood normal.          Assessment & Plan:      This visit  occurred during the SARS-CoV-2 public health emergency.  Safety protocols were in place, including screening questions prior to the visit, additional usage of staff PPE, and extensive cleaning of exam room while observing appropriate contact time as indicated for disinfecting solutions.

## 2021-12-07 NOTE — Assessment & Plan Note (Signed)
Immunizations UTD. PSA due and pending. Colonoscopy due in June 2023, he will notify if he does not hear from GI.  Discussed the importance of a healthy diet and regular exercise in order for weight loss, and to reduce the risk of further co-morbidity.  Exam today stable. Labs pending.

## 2021-12-07 NOTE — Patient Instructions (Signed)
Stop by the lab prior to leaving today. I will notify you of your results once received.   You may take sildenafil 20 mg for erectile dysfunction. Take 2-5 tablets by mouth 30-60 min prior to intercourse.   You are due June 2023 for your colonoscopy.   It was a pleasure to see you today!  Preventive Care 85 Years and Older, Male Preventive care refers to lifestyle choices and visits with your health care provider that can promote health and wellness. Preventive care visits are also called wellness exams. What can I expect for my preventive care visit? Counseling During your preventive care visit, your health care provider may ask about your: Medical history, including: Past medical problems. Family medical history. History of falls. Current health, including: Emotional well-being. Home life and relationship well-being. Sexual activity. Memory and ability to understand (cognition). Lifestyle, including: Alcohol, nicotine or tobacco, and drug use. Access to firearms. Diet, exercise, and sleep habits. Work and work Statistician. Sunscreen use. Safety issues such as seatbelt and bike helmet use. Physical exam Your health care provider will check your: Height and weight. These may be used to calculate your BMI (body mass index). BMI is a measurement that tells if you are at a healthy weight. Waist circumference. This measures the distance around your waistline. This measurement also tells if you are at a healthy weight and may help predict your risk of certain diseases, such as type 2 diabetes and high blood pressure. Heart rate and blood pressure. Body temperature. Skin for abnormal spots. What immunizations do I need? Vaccines are usually given at various ages, according to a schedule. Your health care provider will recommend vaccines for you based on your age, medical history, and lifestyle or other factors, such as travel or where you work. What tests do I need? Screening Your  health care provider may recommend screening tests for certain conditions. This may include: Lipid and cholesterol levels. Diabetes screening. This is done by checking your blood sugar (glucose) after you have not eaten for a while (fasting). Hepatitis C test. Hepatitis B test. HIV (human immunodeficiency virus) test. STI (sexually transmitted infection) testing, if you are at risk. Lung cancer screening. Colorectal cancer screening. Prostate cancer screening. Abdominal aortic aneurysm (AAA) screening. You may need this if you are a current or former smoker. Talk with your health care provider about your test results, treatment options, and if necessary, the need for more tests. Follow these instructions at home: Eating and drinking  Eat a diet that includes fresh fruits and vegetables, whole grains, lean protein, and low-fat dairy products. Limit your intake of foods with high amounts of sugar, saturated fats, and salt. Take vitamin and mineral supplements as recommended by your health care provider. Do not drink alcohol if your health care provider tells you not to drink. If you drink alcohol: Limit how much you have to 0-2 drinks a day. Know how much alcohol is in your drink. In the [place] one drink equals one 12 oz bottle of beer (355 mL), one 5 oz glass of wine (148 mL), or one 1 oz glass of hard liquor (44 mL). Lifestyle Brush your teeth every morning and night with fluoride toothpaste. Floss one time each day. Exercise for at least 30 minutes 5 or more days each week. Do not use any products that contain nicotine or tobacco. These products include cigarettes, chewing tobacco, and vaping devices, such as e-cigarettes. If you need help quitting, ask your health care provider. Do not  use drugs. If you are sexually active, practice safe sex. Use a condom or other form of protection to prevent STIs. Take aspirin only as told by your health care provider. Make sure that you understand how  much to take and what form to take. Work with your health care provider to find out whether it is safe and beneficial for you to take aspirin daily. Ask your health care provider if you need to take a cholesterol-lowering medicine (statin). Find healthy ways to manage stress, such as: Meditation, yoga, or listening to music. Journaling. Talking to a trusted person. Spending time with friends and family. Safety Always wear your seat belt while driving or riding in a vehicle. Do not drive: If you have been drinking alcohol. Do not ride with someone who has been drinking. When you are tired or distracted. While texting. If you have been using any mind-altering substances or drugs. Wear a helmet and other protective equipment during sports activities. If you have firearms in your house, make sure you follow all gun safety procedures. Minimize exposure to UV radiation to reduce your risk of skin cancer. What's next? Visit your health care provider once a year for an annual wellness visit. Ask your health care provider how often you should have your eyes and teeth checked. Stay up to date on all vaccines. This information is not intended to replace advice given to you by your health care provider. Make sure you discuss any questions you have with your health care provider. Document Revised: 05/05/2021 Document Reviewed: 05/05/2021 Elsevier Patient Education  [employer].

## 2021-12-07 NOTE — Assessment & Plan Note (Signed)
Controlled in the office today, continue losartan 25 mg.  CMP pending.

## 2021-12-09 DIAGNOSIS — N529 Male erectile dysfunction, unspecified: Secondary | ICD-10-CM

## 2021-12-09 DIAGNOSIS — E785 Hyperlipidemia, unspecified: Secondary | ICD-10-CM

## 2021-12-09 NOTE — Telephone Encounter (Signed)
°  Message sent to patient to let know started.

## 2021-12-14 NOTE — Telephone Encounter (Signed)
Received fax placed in your box for review. Denied per message below.

## 2021-12-16 MED ORDER — SILDENAFIL CITRATE 20 MG PO TABS: ORAL_TABLET | ORAL | 0 refills | Status: DC

## 2021-12-16 NOTE — Addendum Note (Signed)
Addended by: Pleas Koch on: 12/16/2021 05:52 PM   Modules accepted: Orders

## 2022-01-05 ENCOUNTER — Other Ambulatory Visit: Payer: Self-pay | Admitting: Primary Care

## 2022-01-05 DIAGNOSIS — N529 Male erectile dysfunction, unspecified: Secondary | ICD-10-CM

## 2022-01-14 ENCOUNTER — Telehealth: Payer: Self-pay

## 2022-02-23 ENCOUNTER — Ambulatory Visit: Payer: PPO | Admitting: Dermatology

## 2022-04-01 DIAGNOSIS — Z1211 Encounter for screening for malignant neoplasm of colon: Secondary | ICD-10-CM

## 2022-04-07 ENCOUNTER — Encounter: Payer: Self-pay | Admitting: Physician Assistant

## 2022-04-11 ENCOUNTER — Ambulatory Visit (INDEPENDENT_AMBULATORY_CARE_PROVIDER_SITE_OTHER): Payer: PPO | Admitting: Nurse Practitioner

## 2022-04-11 ENCOUNTER — Encounter: Payer: Self-pay | Admitting: Nurse Practitioner

## 2022-04-11 VITALS — BP 106/62 | HR 70 | Temp 97.4°F | Ht 68.0 in | Wt 179.5 lb

## 2022-04-11 DIAGNOSIS — Z8719 Personal history of other diseases of the digestive system: Secondary | ICD-10-CM | POA: Diagnosis not present

## 2022-04-11 DIAGNOSIS — Z9889 Other specified postprocedural states: Secondary | ICD-10-CM

## 2022-04-11 DIAGNOSIS — K5792 Diverticulitis of intestine, part unspecified, without perforation or abscess without bleeding: Secondary | ICD-10-CM

## 2022-04-11 DIAGNOSIS — R1032 Left lower quadrant pain: Secondary | ICD-10-CM

## 2022-04-11 HISTORY — DX: Personal history of other diseases of the digestive system: Z87.19

## 2022-04-11 MED ORDER — AMOXICILLIN-POT CLAVULANATE 875-125 MG PO TABS: 1.0000 | ORAL_TABLET | Freq: Two times a day (BID) | ORAL | 0 refills | Status: DC

## 2022-04-11 NOTE — Progress Notes (Signed)
Acute Office Visit  Subjective:     Patient ID: Alec Wilson, male    DOB: May 29, 1951, 71 y.o.   MRN: [id number]  Chief Complaint  Patient presents with   Abdominal Pain    Lower left side, history of diverticulitis     Patient is in today for Abdominal pain  States it started hurting a little at the beginning of last week. States that  last weekend he had brussel sprouts that felt like it sat on his stomach and his left lower quadrant.  States that the pain was worse over the past weekend weeknd, but feels better today but still present.  Patient describes a dull aching sensation to the left lower quadrant and then kind of a sharp cramping stabbing pain that is intermittent.  No fever or chills, n/v. No B&B involvement Hernai 2019 and then 2020 on thelft side was the last one. Has consult for colonoscopy on 04/28/2022  Review of Systems  Constitutional:  Negative for chills and fever.  Gastrointestinal:  Positive for abdominal pain. Negative for blood in stool, constipation, diarrhea, nausea and vomiting.  Genitourinary:  Negative for dysuria and hematuria.       Objective:    BP 106/62 (BP Location: Left Arm, Patient Position: Sitting)   Pulse 70   Temp (!) 97.4 F (36.3 C) (Skin)   Ht '5\' 8"'$  (1.727 m)   Wt 179 lb 8 oz (81.4 kg)   SpO2 97%   BMI 27.29 kg/m    Physical Exam Vitals and nursing note reviewed. Exam conducted with a chaperone present (Civil Service fast streamer, LPN).  Constitutional:      Appearance: He is well-developed.  Cardiovascular:     Rate and Rhythm: Normal rate and regular rhythm.     Heart sounds: Normal heart sounds.  Pulmonary:     Effort: Pulmonary effort is normal.     Breath sounds: Normal breath sounds.  Abdominal:     General: Bowel sounds are normal. There is no distension.     Palpations: Abdomen is soft. There is no mass.     Tenderness: There is abdominal tenderness.     Hernia: No hernia is present. There is no hernia in the left  inguinal area or right inguinal area.       Comments: TTP  Lymphadenopathy:     Lower Body: No right inguinal adenopathy. No left inguinal adenopathy.  Neurological:     Mental Status: He is alert.    No results found for any visits on 04/11/22.      Assessment & Plan:   Problem List Items Addressed This Visit       Other   Diverticulitis    Empirically treat with amoxicillin 875-125 twice daily for 10 days.  Did review signs and symptoms when patient is to seek urgent or emergent health care.  Did go over strict signs and symptoms when to return to clinic that we can pursue a CT scan at that time.  Patient stable nontoxic in office will defer CT scan at this time patient is agreement with plan       Relevant Medications   amoxicillin-clavulanate (AUGMENTIN) 875-125 MG tablet   Other Relevant Orders   Comprehensive metabolic panel   CBC with Differential/Platelet   History of bilateral inguinal hernia repair    Patient has history of bilateral inguinal repair right side in 2019 left-sided 2020.  Patient was concerned.  Did do chaperoned exam and was unable to appreciate any  hernias on exam.  Patient not having any testicle or penis pain       Left lower quadrant abdominal pain - Primary    Approximately 1 week.  Patient does have a history of diverticulitis.        Meds ordered this encounter  Medications   amoxicillin-clavulanate (AUGMENTIN) 875-125 MG tablet    Sig: Take 1 tablet by mouth 2 (two) times daily.    Dispense:  20 tablet    Refill:  0    Order Specific Question:   Supervising Provider    Answer:   TOWER, MARNE A [[id number]    Return if symptoms worsen or fail to improve.  Romilda Garret, NP

## 2022-04-11 NOTE — Patient Instructions (Signed)
Nice to see you today I sent in antibiotics for the suspected diverticulitis. If your symptoms get worse or you start having a fever, chills, increased pain or any other worrying symptoms let me know or go to the emergency department

## 2022-04-11 NOTE — Assessment & Plan Note (Signed)
Empirically treat with amoxicillin 875-125 twice daily for 10 days.  Did review signs and symptoms when patient is to seek urgent or emergent health care.  Did go over strict signs and symptoms when to return to clinic that we can pursue a CT scan at that time.  Patient stable nontoxic in office will defer CT scan at this time patient is agreement with plan

## 2022-04-11 NOTE — Assessment & Plan Note (Signed)
Patient has history of bilateral inguinal repair right side in 2019 left-sided 2020.  Patient was concerned.  Did do chaperoned exam and was unable to appreciate any hernias on exam.  Patient not having any testicle or penis pain

## 2022-04-11 NOTE — Assessment & Plan Note (Signed)
Approximately 1 week.  Patient does have a history of diverticulitis.

## 2022-04-12 LAB — COMPREHENSIVE METABOLIC PANEL
ALT: 15 U/L (ref 0–53)
AST: 16 U/L (ref 0–37)
Albumin: 4 g/dL (ref 3.5–5.2)
Alkaline Phosphatase: 94 U/L (ref 39–117)
BUN: 15 mg/dL (ref 6–23)
CO2: 30 mEq/L (ref 19–32)
Calcium: 9.3 mg/dL (ref 8.4–10.5)
Chloride: 99 mEq/L (ref 96–112)
Creatinine, Ser: 0.81 mg/dL (ref 0.40–1.50)
GFR: 90.47 mL/min (ref >60.00)
Glucose, Bld: 130 mg/dL — ABNORMAL HIGH (ref 70–99)
Potassium: 4.7 mEq/L (ref 3.5–5.1)
Sodium: 135 mEq/L (ref 135–145)
Total Bilirubin: 0.4 mg/dL (ref 0.2–1.2)
Total Protein: 6.8 g/dL (ref 6.0–8.3)

## 2022-04-12 LAB — CBC WITH DIFFERENTIAL/PLATELET
Basophils Absolute: 0 10*3/uL (ref 0.0–0.1)
Basophils Relative: 1 % (ref 0.0–3.0)
Eosinophils Absolute: 0.5 10*3/uL (ref 0.0–0.7)
Eosinophils Relative: 11.2 % — ABNORMAL HIGH (ref 0.0–5.0)
HCT: 40.4 % (ref 39.0–52.0)
Hemoglobin: 13.9 g/dL (ref 13.0–17.0)
Lymphocytes Relative: 19.1 % (ref 12.0–46.0)
Lymphs Abs: 0.9 10*3/uL (ref 0.7–4.0)
MCHC: 34.5 g/dL (ref 30.0–36.0)
MCV: 93.2 fl (ref 78.0–100.0)
Monocytes Absolute: 0.4 10*3/uL (ref 0.1–1.0)
Monocytes Relative: 8.5 % (ref 3.0–12.0)
Neutro Abs: 2.9 10*3/uL (ref 1.4–7.7)
Neutrophils Relative %: 60.2 % (ref 43.0–77.0)
Platelets: 253 10*3/uL (ref 150.0–400.0)
RBC: 4.33 Mil/uL (ref 4.22–5.81)
RDW: 13.5 % (ref 11.5–15.5)
WBC: 4.8 10*3/uL (ref 4.0–10.5)

## 2022-04-28 ENCOUNTER — Ambulatory Visit: Payer: PPO | Admitting: Physician Assistant

## 2022-04-28 ENCOUNTER — Encounter: Payer: Self-pay | Admitting: *Deleted

## 2022-04-28 VITALS — BP 116/80 | HR 66 | Ht 68.0 in | Wt 183.0 lb

## 2022-04-28 DIAGNOSIS — Z86010 Personal history of colonic polyps: Secondary | ICD-10-CM

## 2022-04-28 DIAGNOSIS — Z8719 Personal history of other diseases of the digestive system: Secondary | ICD-10-CM | POA: Diagnosis not present

## 2022-04-28 NOTE — Patient Instructions (Signed)
You have been scheduled for a colonoscopy. Please follow written instructions given to you at your visit today.  Please pick up your prep supplies at the pharmacy within the next 1-3 days. If you use inhalers (even only as needed), please bring them with you on the day of your procedure.  If you are age 69 or older, your body mass index should be between 23-30. Your Body mass index is 27.83 kg/m. If this is out of the aforementioned range listed, please consider follow up with your Primary Care Provider.  If you are age 51 or younger, your body mass index should be between 19-25. Your Body mass index is 27.83 kg/m. If this is out of the aformentioned range listed, please consider follow up with your Primary Care Provider.   ________________________________________________________  The [facility] providers would like to encourage you to use [facility] to communicate with providers for non-urgent requests or questions.  Due to long hold times on the telephone, sending your provider a message by [facility] may be a faster and more efficient way to get a response.  Please allow 48 business hours for a response.  Please remember that this is for non-urgent requests.  _______________________________________________________  Due to recent changes in healthcare laws, you may see the results of your imaging and laboratory studies on MyChart before your provider has had a chance to review them.  We understand that in some cases there may be results that are confusing or concerning to you. Not all laboratory results come back in the same time frame and the provider may be waiting for multiple results in order to interpret others.  Please give [place] 48 hours in order for your provider to thoroughly review all the results before contacting the office for clarification of your results.

## 2022-04-28 NOTE — Progress Notes (Signed)
Chief Complaint: Abdominal pain and history of colon polyps  HPI:    Alec Wilson is a 71 year old male with a past medical history as listed below including A-fib on aspirin, known to Dr. Carlean Purl, who was referred to me by Pleas Koch, NP for a complaint of abdominal pain and history of colon polyps.      04/22/2015 colonoscopy done for history of adenomatous polyps with a sessile polyp in the descending colon and moderate diverticulosis in the sigmoid colon.  Pathology showed diminutive adenoma and repeat was recommended in 5 years.    04/11/2022 office visit PCP and discuss left lower quadrant pain.  He was empirically treated for diverticulitis with Augmentin 875-125 twice daily for 10 days.    Today, the patient reports his history as above of left lower quadrant pain for which he saw his PCP.  Tells me 2 days after taking the Augmentin it went away.  He has had no further pain since.  Tells me now he is overdue for a colonoscopy.  Does describe some occasional constipation for which he uses Metamucil or MiraLAX as needed.    Does want to make [place] aware that he has had 2 inguinal hernias and had surgery for both over the past few years.    Denies fever, chills, weight loss or blood in his stool.  Past Medical History:  Diagnosis Date   Acute viral sinusitis 12/14/2018   Adenomatous colon polyp    last colonoscopy was 3 years ago.   Anemia    Diverticulosis    Hemorrhoids summer 2005   s/p ligation of internal hemorrhoids   HTN (hypertension)    Hyperlipidemia    Inguinal hernia    PAF (paroxysmal atrial fibrillation) ([place]) 1999   Stress ECHO-Negative (09/22/98) -> no further documented evidence of A. fib   RMSF [facility] spotted fever) 05/2004   Sore throat 09/08/2021   Tubular adenoma of colon     Past Surgical History:  Procedure Laterality Date   CARDIAC EVENT MONITOR  10/2019   Baseline sample showed sinus rhythm with average rate 79 bpm.  Minimum heart rate 48 bpm.   Maximum heart rate sinus tachycardia 159 bpm. <1% PVCs.  No A. fib.  Bradycardia was noted during sleeping hours.  Patient had one episode of sensation of fluttering or skipped beats during which she had a short run of sinus rhythm with PVCs in bigeminy.   COLONOSCOPY  2013   HEMORRHOID SURGERY  2005   INGUINAL HERNIA REPAIR Right 2019   NM MYOVIEW LTD  11/2019   EF 60%..  Medium size, mild severity fixed apical defect with normal wall motion consistent with artifact.  LOW Risk   THROAT SURGERY  childhood   TRANSTHORACIC ECHOCARDIOGRAM  12/04/2019    EF 60 to 65%.  No R WMA.  Mild basal septal hypertrophy.  Normal RV function but mildly increased size.  Normal atrial sizes.  Mild aortic sclerosis, no stenosis.   VASECTOMY      Current Outpatient Medications  Medication Sig Dispense Refill   amoxicillin-clavulanate (AUGMENTIN) 875-125 MG tablet Take 1 tablet by mouth 2 (two) times daily. 20 tablet 0   aspirin 81 MG tablet Take 81 mg by mouth daily.     fish oil-omega-3 fatty acids 1000 MG capsule Take 1 g by mouth daily.     losartan (COZAAR) 25 MG tablet Take 1 tablet (25 mg total) by mouth daily. For blood pressure. 90 tablet 3  Multiple Vitamins-Minerals (MULTIVITAMIN PO) Take 1 Centrum Silver multivitamin by mouth daily     sildenafil (REVATIO) 20 MG tablet TAKE 2-5 TABLETS BY MOUTH 30-60 MIN PRIOR TO INTERCOURSE. 25 tablet 0   simvastatin (ZOCOR) 20 MG tablet TAKE 1 TABLET DAILY IN EVENING FOR CHOLESTEROL 90 tablet 3   No current facility-administered medications for this visit.    Allergies as of 04/28/2022   (No Known Allergies)    Family History  Problem Relation Age of Onset   Coronary artery disease Father 38       CABG at age 24, MI at 70.   Parkinson's disease Father    Colon polyps Father    Congestive Heart Failure Father    Hypertension Brother    Hypertension Mother    Arthritis Mother    Heart attack Mother 20   Cancer Maternal Grandmother    Cancer Maternal  Grandfather    Heart attack Paternal Grandfather 25   Rectal cancer Neg Hx    Stomach cancer Neg Hx    Colon cancer Neg Hx    Esophageal cancer Neg Hx     Social History   Socioeconomic History   Marital status: Married    Spouse name: Not on file   Number of children: Not on file   Years of education: Not on file   Highest education level: Not on file  Occupational History   Not on file  Tobacco Use   Smoking status: Former    Packs/day: 1.00    Types: Cigarettes   Smokeless tobacco: Former    Quit date: 02/19/1998  Substance and Sexual Activity   Alcohol use: Yes    Alcohol/week: 2.0 standard drinks of alcohol    Types: 2 Glasses of wine per week    Comment: weekly   Drug use: No   Sexual activity: Yes    Birth control/protection: Surgical  Other Topics Concern   Not on file  Social History Narrative   From [place].   Works at [employer] in [employer].   Married.  2 children: 39, 27.    Daughters live in [place] and [place].   Enjoys golfing, visiting vineyards, traveling --> exercises 6 days a week 1 walking and playing golf      Quit smoking in 1999.   Drinks roughly 5 glasses of wine a week.   Social Determinants of Health   Financial Resource Strain: Low Risk  (12/06/2021)   Overall Financial Resource Strain (CARDIA)    Difficulty of Paying Living Expenses: Not hard at all  Food Insecurity: No Food Insecurity (12/06/2021)   Hunger Vital Sign    Worried About Running Out of Food in the Last Year: Never true    Ran Out of Food in the Last Year: Never true  Transportation Needs: No Transportation Needs (12/06/2021)   PRAPARE - Hydrologist (Medical): No    Lack of Transportation (Non-Medical): No  Physical Activity: Sufficiently Active (12/06/2021)   Exercise Vital Sign    Days of Exercise per Week: 7 days    Minutes of Exercise per Session: 60 min  Stress: No Stress Concern Present (12/06/2021)   [place]    Feeling of Stress : Not at all  Social Connections: Moderately Integrated (12/06/2021)   Social Connection and Isolation Panel [NHANES]    Frequency of Communication with Friends and Family: More than three times a week  Frequency of Social Gatherings with Friends and Family: More than three times a week    Attends Religious Services: More than 4 times per year    Active Member of [employer] or Organizations: No    Attends Archivist Meetings: Never    Marital Status: Married  Human resources officer Violence: Not At Risk (12/06/2021)   Humiliation, Afraid, Rape, and Kick questionnaire    Fear of Current or Ex-Partner: No    Emotionally Abused: No    Physically Abused: No    Sexually Abused: No    Review of Systems:    Constitutional: No weight loss, fever or chills Skin: No rash Cardiovascular: No chest pain   Respiratory: No SOB  Gastrointestinal: See HPI and otherwise negative Genitourinary: No dysuria Neurological: No headache, dizziness or syncope Musculoskeletal: No new muscle or joint pain Hematologic: No bleeding Psychiatric: No history of depression or anxiety   Physical Exam:  Vital signs: BP 116/80   Pulse 66   Ht '5\' 8"'$  (1.727 m)   Wt 183 lb (83 kg)   BMI 27.83 kg/m    Constitutional:   Pleasant Caucasian male appears to be in NAD, Well developed, Well nourished, alert and cooperative Head:  Normocephalic and atraumatic. Eyes:   PEERL, EOMI. No icterus. Conjunctiva pink. Ears:  Normal auditory acuity. Neck:  Supple Throat: Oral cavity and pharynx without inflammation, swelling or lesion.  Respiratory: Respirations even and unlabored. Lungs clear to auscultation bilaterally.   No wheezes, crackles, or rhonchi.  Cardiovascular: Normal S1, S2. No MRG. Regular rate and rhythm. No peripheral edema, cyanosis or pallor.  Gastrointestinal:  Soft, nondistended, nontender. No rebound or guarding. Normal bowel  sounds. No appreciable masses or hepatomegaly. Rectal:  Not performed.  Msk:  Symmetrical without gross deformities. Without edema, no deformity or joint abnormality.  Neurologic:  Alert and  oriented x4;  grossly normal neurologically.  Skin:   Dry and intact without significant lesions or rashes. Psychiatric: Demonstrates good judgement and reason without abnormal affect or behaviors.  RELEVANT LABS AND IMAGING: CBC    Component Value Date/Time   WBC 4.8 04/11/2022 1513   RBC 4.33 04/11/2022 1513   HGB 13.9 04/11/2022 1513   HCT 40.4 04/11/2022 1513   PLT 253.0 04/11/2022 1513   MCV 93.2 04/11/2022 1513   MCH 32.0 04/29/2014 1316   MCHC 34.5 04/11/2022 1513   RDW 13.5 04/11/2022 1513   LYMPHSABS 0.9 04/11/2022 1513   MONOABS 0.4 04/11/2022 1513   EOSABS 0.5 04/11/2022 1513   BASOSABS 0.0 04/11/2022 1513    CMP     Component Value Date/Time   NA 135 04/11/2022 1513   K 4.7 04/11/2022 1513   CL 99 04/11/2022 1513   CO2 30 04/11/2022 1513   GLUCOSE 130 (H) 04/11/2022 1513   BUN 15 04/11/2022 1513   CREATININE 0.81 04/11/2022 1513   CREATININE 0.87 04/29/2014 1316   CALCIUM 9.3 04/11/2022 1513   PROT 6.8 04/11/2022 1513   ALBUMIN 4.0 04/11/2022 1513   AST 16 04/11/2022 1513   ALT 15 04/11/2022 1513   ALKPHOS 94 04/11/2022 1513   BILITOT 0.4 04/11/2022 1513    Assessment: 1.  History of adenomatous polyps: Last colonoscopy in 2016 with recommendation for repeat in 5 years, patient is overdue 2.  History of diverticulitis: Recent episode treated with Augmentin by his PCP, patient finished antibiotics and has no further pain  Plan: 1.  Patient's diverticulitis is now resolved.  We will wait 4 to 6  weeks from when he finished his antibiotics to schedule his colonoscopy. 2.  Scheduled patient for surveillance colonoscopy in the [place] given his history of adenomatous polyps with Dr. Carlean Purl.  Did provide the patient a detailed list of risks for the procedure and he agrees to  proceed. Patient is appropriate for endoscopic procedure(s) in the ambulatory ([place]) setting.  3.  Discussed using a fiber supplement on a daily basis to avoid times of constipation.  Patient tells me that the Metamucil makes him bloated, we discussed other ways to supplement his fiber 4.  Patient follow in clinic per recommendations from Dr. Carlean Purl after time of colonoscopy.  Ellouise Newer, PA-C [facility] 04/28/2022, 10:37 AM  Cc: Pleas Koch, NP

## 2022-06-21 ENCOUNTER — Encounter: Payer: Self-pay | Admitting: Internal Medicine

## 2022-06-21 ENCOUNTER — Ambulatory Visit (AMBULATORY_SURGERY_CENTER): Payer: PPO | Admitting: Internal Medicine

## 2022-06-21 VITALS — BP 124/68 | HR 50 | Temp 96.8°F | Resp 18 | Ht 68.0 in | Wt 183.0 lb

## 2022-06-21 DIAGNOSIS — Z09 Encounter for follow-up examination after completed treatment for conditions other than malignant neoplasm: Secondary | ICD-10-CM

## 2022-06-21 DIAGNOSIS — I4891 Unspecified atrial fibrillation: Secondary | ICD-10-CM | POA: Diagnosis not present

## 2022-06-21 DIAGNOSIS — Z86010 Personal history of colonic polyps: Secondary | ICD-10-CM | POA: Diagnosis not present

## 2022-06-21 DIAGNOSIS — D128 Benign neoplasm of rectum: Secondary | ICD-10-CM

## 2022-06-21 DIAGNOSIS — I10 Essential (primary) hypertension: Secondary | ICD-10-CM | POA: Diagnosis not present

## 2022-06-21 MED ORDER — SODIUM CHLORIDE 0.9 % IV SOLN: 500.0000 mL | Freq: Once | INTRAVENOUS | Status: DC

## 2022-06-21 NOTE — Patient Instructions (Addendum)
I found and removed one tiny polyp. I will let you know pathology results and when to have another routine colonoscopy by mail and/or My Chart.  Also saw diverticulosis again.  I appreciate the opportunity to care for you. Gatha Mayer, MD, [facility]  Information on polyps and diverticulosis given to you today.  Await pathology results.  Resume previous diet and medications.  YOU HAD AN ENDOSCOPIC PROCEDURE TODAY AT [facility]:   Refer to the procedure report that was given to you for any specific questions about what was found during the examination.  If the procedure report does not answer your questions, please call your gastroenterologist to clarify.  If you requested that your care partner not be given the details of your procedure findings, then the procedure report has been included in a sealed envelope for you to review at your convenience later.  YOU SHOULD EXPECT: Some feelings of bloating in the abdomen. Passage of more gas than usual.  Walking can help get rid of the air that was put into your GI tract during the procedure and reduce the bloating. If you had a lower endoscopy (such as a colonoscopy or flexible sigmoidoscopy) you may notice spotting of blood in your stool or on the toilet paper. If you underwent a bowel prep for your procedure, you may not have a normal bowel movement for a few days.  Please Note:  You might notice some irritation and congestion in your nose or some drainage.  This is from the oxygen used during your procedure.  There is no need for concern and it should clear up in a day or so.  SYMPTOMS TO REPORT IMMEDIATELY:  Following lower endoscopy (colonoscopy or flexible sigmoidoscopy):  Excessive amounts of blood in the stool  Significant tenderness or worsening of abdominal pains  Swelling of the abdomen that is new, acute  Fever of 100F or higher  For urgent or emergent issues, a gastroenterologist can be reached at any hour by  calling [phone]. Do not use MyChart messaging for urgent concerns.    DIET:  We do recommend a small meal at first, but then you may proceed to your regular diet.  Drink plenty of fluids but you should avoid alcoholic beverages for 24 hours.  ACTIVITY:  You should plan to take it easy for the rest of today and you should NOT DRIVE or use heavy machinery until tomorrow (because of the sedation medicines used during the test).    FOLLOW UP: Our staff will call the number listed on your records the next business day following your procedure.  We will call around 7:15- 8:00 am to check on you and address any questions or concerns that you may have regarding the information given to you following your procedure. If we do not reach you, we will leave a message.  If you develop any symptoms (ie: fever, flu-like symptoms, shortness of breath, cough etc.) before then, please call [phone].  If you test positive for Covid 19 in the 2 weeks post procedure, please call and report this information to [place].    If any biopsies were taken you will be contacted by phone or by letter within the next 1-3 weeks.  Please call us at [phone] if you have not heard about the biopsies in 3 weeks.    SIGNATURES/CONFIDENTIALITY: You and/or your care partner have signed paperwork which will be entered into your electronic medical record.  These signatures attest to the fact  that that the information above on your After Visit Summary has been reviewed and is understood.  Full responsibility of the confidentiality of this discharge information lies with you and/or your care-partner.

## 2022-06-21 NOTE — Progress Notes (Signed)
[facility] History and Physical   Primary Care Physician:  Pleas Koch, NP   Reason for Procedure:   Hx adenomatous polyp  Plan:    colonoscopy     HPI: Alec Wilson is a 71 y.o. male w/ hx diminutive adenoma 2016.   Past Medical History:  Diagnosis Date   Acute viral sinusitis 12/14/2018   Adenomatous colon polyp    last colonoscopy was 3 years ago.   Anemia    Diverticulosis    Hemorrhoids summer 2005   s/p ligation of internal hemorrhoids   HTN (hypertension)    Hyperlipidemia    Inguinal hernia    PAF (paroxysmal atrial fibrillation) ([place]) 1999   Stress ECHO-Negative (09/22/98) -> no further documented evidence of A. fib   RMSF [facility] spotted fever) 05/2004   Sore throat 09/08/2021   Tubular adenoma of colon     Past Surgical History:  Procedure Laterality Date   CARDIAC EVENT MONITOR  10/2019   Baseline sample showed sinus rhythm with average rate 79 bpm.  Minimum heart rate 48 bpm.  Maximum heart rate sinus tachycardia 159 bpm. <1% PVCs.  No A. fib.  Bradycardia was noted during sleeping hours.  Patient had one episode of sensation of fluttering or skipped beats during which she had a short run of sinus rhythm with PVCs in bigeminy.   COLONOSCOPY  2013   HEMORRHOID SURGERY  2005   INGUINAL HERNIA REPAIR Right 2019   NM MYOVIEW LTD  11/2019   EF 60%..  Medium size, mild severity fixed apical defect with normal wall motion consistent with artifact.  LOW Risk   THROAT SURGERY  childhood   TRANSTHORACIC ECHOCARDIOGRAM  12/04/2019    EF 60 to 65%.  No R WMA.  Mild basal septal hypertrophy.  Normal RV function but mildly increased size.  Normal atrial sizes.  Mild aortic sclerosis, no stenosis.   VASECTOMY      Prior to Admission medications   Medication Sig Start Date End Date Taking? Authorizing Provider  aspirin 81 MG tablet Take 81 mg by mouth daily.   Yes [provider]  fish oil-omega-3 fatty acids 1000 MG capsule  Take 1 g by mouth daily.   Yes [provider]  losartan (COZAAR) 25 MG tablet Take 1 tablet (25 mg total) by mouth daily. For blood pressure. 12/08/21  Yes Pleas Koch, NP  simvastatin (ZOCOR) 20 MG tablet TAKE 1 TABLET DAILY IN EVENING FOR CHOLESTEROL 12/08/21  Yes Pleas Koch, NP  Multiple Vitamins-Minerals (MULTIVITAMIN PO) Take 1 Centrum Silver multivitamin by mouth daily    [provider]    Current Outpatient Medications  Medication Sig Dispense Refill   aspirin 81 MG tablet Take 81 mg by mouth daily.     fish oil-omega-3 fatty acids 1000 MG capsule Take 1 g by mouth daily.     losartan (COZAAR) 25 MG tablet Take 1 tablet (25 mg total) by mouth daily. For blood pressure. 90 tablet 3   simvastatin (ZOCOR) 20 MG tablet TAKE 1 TABLET DAILY IN EVENING FOR CHOLESTEROL 90 tablet 3   Multiple Vitamins-Minerals (MULTIVITAMIN PO) Take 1 Centrum Silver multivitamin by mouth daily     Current Facility-Administered Medications  Medication Dose Route Frequency Provider Last Rate Last Admin   0.9 %  sodium chloride infusion  500 mL Intravenous Once Gatha Mayer, MD        Allergies as of 06/21/2022   (No Known Allergies)  Family History  Problem Relation Age of Onset   Coronary artery disease Father 93       CABG at age 65, MI at 47.   Parkinson's disease Father    Colon polyps Father    Congestive Heart Failure Father    Hypertension Brother    Hypertension Mother    Arthritis Mother    Heart attack Mother 18   Cancer Maternal Grandmother    Cancer Maternal Grandfather    Heart attack Paternal Grandfather 41   Rectal cancer Neg Hx    Stomach cancer Neg Hx    Colon cancer Neg Hx    Esophageal cancer Neg Hx     Social History   Socioeconomic History   Marital status: Married    Spouse name: Not on file   Number of children: 2   Years of education: Not on file   Highest education level: Not on file  Occupational History   Occupation:  retired  Tobacco Use   Smoking status: Former    Packs/day: 1.00    Types: Cigarettes   Smokeless tobacco: Former    Quit date: 02/19/1998  Vaping Use   Vaping Use: Never used  Substance and Sexual Activity   Alcohol use: Yes    Alcohol/week: 2.0 standard drinks of alcohol    Types: 2 Glasses of wine per week    Comment: a glass of wine weekly   Drug use: No   Sexual activity: Yes    Birth control/protection: Surgical  Other Topics Concern   Not on file  Social History Narrative   From [place].   Works at [employer] in [employer].   Married.  2 children: 39, 27.    Daughters live in [place] and [place].   Enjoys golfing, visiting vineyards, traveling --> exercises 6 days a week 1 walking and playing golf      Quit smoking in 1999.   Drinks roughly 5 glasses of wine a week.   Social Determinants of Health   Financial Resource Strain: Low Risk  (12/06/2021)   Overall Financial Resource Strain (CARDIA)    Difficulty of Paying Living Expenses: Not hard at all  Food Insecurity: No Food Insecurity (12/06/2021)   Hunger Vital Sign    Worried About Running Out of Food in the Last Year: Never true    Ran Out of Food in the Last Year: Never true  Transportation Needs: No Transportation Needs (12/06/2021)   PRAPARE - Hydrologist (Medical): No    Lack of Transportation (Non-Medical): No  Physical Activity: Sufficiently Active (12/06/2021)   Exercise Vital Sign    Days of Exercise per Week: 7 days    Minutes of Exercise per Session: 60 min  Stress: No Stress Concern Present (12/06/2021)   [place]    Feeling of Stress : Not at all  Social Connections: Moderately Integrated (12/06/2021)   Social Connection and Isolation Panel [NHANES]    Frequency of Communication with Friends and Family: More than three times a week    Frequency of Social Gatherings with Friends and Family:  More than three times a week    Attends Religious Services: More than 4 times per year    Active Member of [employer] or Organizations: No    Attends Archivist Meetings: Never    Marital Status: Married  Human resources officer Violence: Not At Risk (12/06/2021)   Humiliation, Afraid,  Rape, and Kick questionnaire    Fear of Current or Ex-Partner: No    Emotionally Abused: No    Physically Abused: No    Sexually Abused: No    Review of Systems:  All other review of systems negative except as mentioned in the HPI.  Physical Exam: Vital signs BP (!) 163/87   Pulse 60   Temp (!) 96.8 F (36 C)   Ht '5\' 8"'$  (1.727 m)   Wt 183 lb (83 kg)   SpO2 100%   BMI 27.83 kg/m   General:   Alert,  Well-developed, well-nourished, pleasant and cooperative in NAD Lungs:  Clear throughout to auscultation.   Heart:  Regular rate and rhythm; no murmurs, clicks, rubs,  or gallops. Abdomen:  Soft, nontender and nondistended. Normal bowel sounds.   Neuro/Psych:  Alert and cooperative. Normal mood and affect. A and O x 3   '@Yaremi Stahlman'$  Simonne Maffucci, MD, [facility] [phone] (pager) 06/21/2022 8:37 AM@

## 2022-06-21 NOTE — Progress Notes (Signed)
Vital signs checked by:CW ? ?The medical and surgical history was reviewed and verified with the patient. ? ?

## 2022-06-21 NOTE — Op Note (Signed)
[facility] Patient Name: Alec Wilson Procedure Date: 06/21/2022 8:35 AM MRN: [id number] Endoscopist: Gatha Mayer , MD Age: 71 Referring MD:  Date of Birth: [birth year] Gender: Male Account #: [id number] Procedure:                Colonoscopy Indications:              Surveillance: Personal history of adenomatous                            polyps on last colonoscopy > 5 years ago, Last                            colonoscopy: 2016 Medicines:                Monitored Anesthesia Care Procedure:                Pre-Anesthesia Assessment:                           - Prior to the procedure, a History and Physical                            was performed, and patient medications and                            allergies were reviewed. The patient's tolerance of                            previous anesthesia was also reviewed. The risks                            and benefits of the procedure and the sedation                            options and risks were discussed with the patient.                            All questions were answered, and informed consent                            was obtained. Prior Anticoagulants: The patient has                            taken no previous anticoagulant or antiplatelet                            agents. ASA Grade Assessment: II - A patient with                            mild systemic disease. After reviewing the risks                            and benefits, the patient was deemed in  satisfactory condition to undergo the procedure.                           After obtaining informed consent, the colonoscope                            was passed under direct vision. Throughout the                            procedure, the patient's blood pressure, pulse, and                            oxygen saturations were monitored continuously. The                            Olympus CF-[id number] ([id number]) Colonoscope was                             introduced through the anus and advanced to the the                            cecum, identified by appendiceal orifice and                            ileocecal valve. The colonoscopy was performed                            without difficulty. The patient tolerated the                            procedure well. The quality of the bowel                            preparation was good. The bowel preparation used                            was Miralax via split dose instruction. The                            ileocecal valve, appendiceal orifice, and rectum                            were photographed. Scope In: 8:44:21 AM Scope Out: 8:59:13 AM Scope Withdrawal Time: 0 hours 11 minutes 31 seconds  Total Procedure Duration: 0 hours 14 minutes 52 seconds  Findings:                 The perianal and digital rectal examinations were                            normal.                           A diminutive polyp was found in the rectum. The  polyp was sessile. The polyp was removed with a                            cold snare. Resection and retrieval were complete.                            Verification of patient identification for the                            specimen was done. Estimated blood loss was minimal.                           Multiple diverticula were found in the sigmoid                            colon and descending colon.                           The exam was otherwise without abnormality on                            direct and retroflexion views. Complications:            No immediate complications. Estimated Blood Loss:     Estimated blood loss was minimal. Impression:               - One diminutive polyp in the rectum, removed with                            a cold snare. Resected and retrieved.                           - Diverticulosis in the sigmoid colon and in the                            descending colon. Small  hypertrophied anal papilla                            vs tag seen also.                           - The examination was otherwise normal on direct                            and retroflexion views.                           - Personal history of colonic polyp diminutive                            adenoma 2016. Recommendation:           - Patient has a contact number available for                            emergencies. The signs and symptoms  of potential                            delayed complications were discussed with the                            patient. Return to normal activities tomorrow.                            Written discharge instructions were provided to the                            patient.                           - Resume previous diet.                           - Continue present medications.                           - Repeat colonoscopy is recommended for                            surveillance. The colonoscopy date will be                            determined after pathology results from today's                            exam become available for review. Gatha Mayer, MD 06/21/2022 9:06:29 AM This report has been signed electronically.

## 2022-06-21 NOTE — Progress Notes (Signed)
Called to room to assist during endoscopic procedure.  Patient ID and intended procedure confirmed with present staff. Received instructions for my participation in the procedure from the performing physician.  

## 2022-06-21 NOTE — Progress Notes (Signed)
Sedate, gd SR, tolerated procedure well, VSS, report to RN 

## 2022-06-22 ENCOUNTER — Telehealth: Payer: Self-pay | Admitting: *Deleted

## 2022-06-22 NOTE — Telephone Encounter (Signed)
  Follow up Call-     06/21/2022    8:14 AM  Call back number  Post procedure Call Back phone  # [phone]  Permission to leave phone message Yes     Patient questions:  Do you have a fever, pain , or abdominal swelling? No. Pain Score  0 *  Have you tolerated food without any problems? Yes.    Have you been able to return to your normal activities? Yes.    Do you have any questions about your discharge instructions: Diet   No. Medications  No. Follow up visit  No.  Do you have questions or concerns about your Care? No.  Actions: * If pain score is 4 or above: No action needed, pain <4.

## 2022-06-28 ENCOUNTER — Encounter: Payer: Self-pay | Admitting: Internal Medicine

## 2022-08-04 ENCOUNTER — Encounter: Payer: Self-pay | Admitting: Nurse Practitioner

## 2022-08-04 NOTE — Telephone Encounter (Signed)
Please triage. He has an appointment with me in the morning

## 2022-08-04 NOTE — Telephone Encounter (Signed)
I spoke with pt; pt said starting on 07/31/22 pt began with symptoms; lower lt dull abd pain on and off; having abd cramping as well. Watery diarrhea on and off. In last 24 hrs had 8 watery stools; pt has seen a lot of mucus with diarrhea; last time had diarrhea was 3 - 4 AM this morning. No vomiting, no blood noted and no fever. Pt has slight nausea. Pt said right now pt feels the best he has felt all day. Abd pain level now is 1 - 2. Pt has been eating nuts recently; advised pt not to eat nuts or raw fruits or vegetables, or foods with seeds. Pt said when seen 03/2022 Augmentin really helped the diverticulitis. UC & ED precautions given and pt voiced understanding. Sending note to Romilda Garret NP.

## 2022-08-04 NOTE — Telephone Encounter (Signed)
noted 

## 2022-08-05 ENCOUNTER — Ambulatory Visit (INDEPENDENT_AMBULATORY_CARE_PROVIDER_SITE_OTHER): Payer: PPO | Admitting: Nurse Practitioner

## 2022-08-05 VITALS — BP 118/72 | HR 85 | Temp 96.4°F | Resp 14 | Ht 68.0 in | Wt 180.0 lb

## 2022-08-05 DIAGNOSIS — R1032 Left lower quadrant pain: Secondary | ICD-10-CM

## 2022-08-05 DIAGNOSIS — R197 Diarrhea, unspecified: Secondary | ICD-10-CM

## 2022-08-05 LAB — COMPREHENSIVE METABOLIC PANEL WITH GFR
ALT: 17 U/L (ref 0–53)
AST: 15 U/L (ref 0–37)
Albumin: 3.8 g/dL (ref 3.5–5.2)
Alkaline Phosphatase: 101 U/L (ref 39–117)
BUN: 13 mg/dL (ref 6–23)
CO2: 29 meq/L (ref 19–32)
Calcium: 9.2 mg/dL (ref 8.4–10.5)
Chloride: 101 meq/L (ref 96–112)
Creatinine, Ser: 0.74 mg/dL (ref 0.40–1.50)
GFR: 92.77 mL/min
Glucose, Bld: 82 mg/dL (ref 70–99)
Potassium: 4.9 meq/L (ref 3.5–5.1)
Sodium: 137 meq/L (ref 135–145)
Total Bilirubin: 0.7 mg/dL (ref 0.2–1.2)
Total Protein: 6.5 g/dL (ref 6.0–8.3)

## 2022-08-05 LAB — CBC
HCT: 41.7 % (ref 39.0–52.0)
Hemoglobin: 14.3 g/dL (ref 13.0–17.0)
MCHC: 34.4 g/dL (ref 30.0–36.0)
MCV: 93.1 fl (ref 78.0–100.0)
Platelets: 336 10*3/uL (ref 150.0–400.0)
RBC: 4.48 Mil/uL (ref 4.22–5.81)
RDW: 13.2 % (ref 11.5–15.5)
WBC: 4.5 10*3/uL (ref 4.0–10.5)

## 2022-08-05 MED ORDER — AMOXICILLIN-POT CLAVULANATE 875-125 MG PO TABS: 1.0000 | ORAL_TABLET | Freq: Two times a day (BID) | ORAL | 0 refills | Status: AC

## 2022-08-05 NOTE — Progress Notes (Signed)
Acute Office Visit  Subjective:     Patient ID: Alec Wilson, male    DOB: [birth year], 71 y.o.   MRN: [id number]  Chief Complaint  Patient presents with   Abdominal Pain    07/31/22 pt began with symptoms; lower lt dull abd pain on and off; having abd cramping as well. Watery diarrhea on and off. Nausea.      Patient is in today for Abdominal pain  Started on 07/31/2022. Started approx 9-930 out of nowhere. States that he had severe cramps and diarrhea. It kept happening. States later on Monday he felt better and some of the symptoms eased off. Tuesday night it hit again with diarrhea and lasted all night long. States that he is experiencing a soreness/pain in the lower left side of his abdomen. States that yesterday was some better. Feeling better today  States that he has changed his diet since Wednesday.  Does have a history of diverticulitis and recently had a colonoscopy preformed on 06/21/2022. It did demonstrate diverticula but no inflammation   Review of Systems  Constitutional:  Positive for chills (slight). Negative for fever.  Respiratory:  Negative for shortness of breath.   Cardiovascular:  Negative for chest pain.  Gastrointestinal:  Positive for abdominal pain, diarrhea and nausea. Negative for vomiting.  Neurological:  Negative for headaches.        Objective:    BP 118/72   Pulse 85   Temp (!) 96.4 F (35.8 C)   Resp 14   Ht '5\' 8"'$  (1.727 m)   Wt 180 lb (81.6 kg)   SpO2 100%   BMI 27.37 kg/m    Physical Exam Vitals and nursing note reviewed.  Constitutional:      Appearance: He is well-developed.  Cardiovascular:     Rate and Rhythm: Normal rate and regular rhythm.     Heart sounds: Normal heart sounds.  Pulmonary:     Effort: Pulmonary effort is normal.     Breath sounds: Normal breath sounds.  Abdominal:     General: Bowel sounds are normal. There is no distension.     Palpations: There is no mass.     Tenderness: There is abdominal  tenderness.     Hernia: No hernia is present.       Comments: Slight discomfort described as soreness  Neurological:     Mental Status: He is alert.     No results found for any visits on 08/05/22.      Assessment & Plan:   Problem List Items Addressed This Visit       Digestive   Diarrhea of presumed infectious origin - Primary    Patient's been having at times severe diarrhea and intermittent diarrhea thereafter.  States he is unsure if is to do with food choices as he did have a chicken salad at 1 point.  His spouse was in the same home and has a fairly similar diet and she has not experienced any of the diarrhea.  We will check basic electrolytes and kidney function to make sure he is not causing acute kidney injury nor electrolyte disturbance.      Relevant Orders   CBC   Comprehensive metabolic panel     Other   Left lower quadrant abdominal pain    Not a classic presentation of diverticulitis.  Patient states it does not feel similar to his previous diverticular flare.  We did have a joint discussion with the weekend coming up I will  WASP him a prescription for Augmentin for 7 days.  This was printed on the office visit given to patient prior to dismissal.  Did discuss signs and symptoms when to get this filled.  He can always follow-up next week with primary care colleague if symptoms do not improve.      Relevant Medications   amoxicillin-clavulanate (AUGMENTIN) 875-125 MG tablet   Other Relevant Orders   CBC   Comprehensive metabolic panel    Meds ordered this encounter  Medications   amoxicillin-clavulanate (AUGMENTIN) 875-125 MG tablet    Sig: Take 1 tablet by mouth 2 (two) times daily for 7 days.    Dispense:  14 tablet    Refill:  0    Order Specific Question:   Supervising Provider    Answer:   TOWER, MARNE A [[id number]]    Return if symptoms worsen or fail to improve.  Romilda Garret, NP

## 2022-08-05 NOTE — Assessment & Plan Note (Signed)
Patient's been having at times severe diarrhea and intermittent diarrhea thereafter.  States he is unsure if is to do with food choices as he did have a chicken salad at 1 point.  His spouse was in the same home and has a fairly similar diet and she has not experienced any of the diarrhea.  We will check basic electrolytes and kidney function to make sure he is not causing acute kidney injury nor electrolyte disturbance.

## 2022-08-05 NOTE — Patient Instructions (Signed)
Nice to see you today Fill the prescription if you get worse like; fever, increased abdominal pain. Follow up if no improvement I will be in touch with the labs once I have the results

## 2022-08-05 NOTE — Assessment & Plan Note (Signed)
Not a classic presentation of diverticulitis.  Patient states it does not feel similar to his previous diverticular flare.  We did have a joint discussion with the weekend coming up I will WASP him a prescription for Augmentin for 7 days.  This was printed on the office visit given to patient prior to dismissal.  Did discuss signs and symptoms when to get this filled.  He can always follow-up next week with primary care colleague if symptoms do not improve.

## 2022-10-26 DIAGNOSIS — H35361 Drusen (degenerative) of macula, right eye: Secondary | ICD-10-CM | POA: Diagnosis not present

## 2022-11-16 DIAGNOSIS — H25042 Posterior subcapsular polar age-related cataract, left eye: Secondary | ICD-10-CM | POA: Diagnosis not present

## 2022-11-24 ENCOUNTER — Encounter: Payer: Self-pay | Admitting: Ophthalmology

## 2022-12-02 NOTE — Discharge Instructions (Signed)
   Cataract Surgery, Care After ? ?This sheet gives you information about how to care for yourself after your surgery.  Your ophthalmologist may also give you more specific instructions.  If you have problems or questions, contact your doctor at  [facility], [phone]. ? ?What can I expect after the surgery? ?It is common to have: ?Itching ?Foreign body sensation (feels like a grain of sand in the eye) ?Watery discharge (excess tearing) ?Sensitivity to light and touch ?Bruising in or around the eye ?Mild blurred vision ? ?Follow these instructions at home: ?Do not touch or rub your eyes. ?You may be told to wear a protective shield or sunglasses to protect your eyes. ?Do not put a contact lens in the operative eye unless your doctor approves. ?Keep the lids and face clean and dry. ?Do not allow water to hit you directly in the face while showering. ?Keep soap and shampoo out of your eyes. ?Do not use eye makeup for 1 week. ? ?Check your eye every day for signs of infection.  Watch for: ?Redness, swelling, or pain. ?Fluid, blood or pus. ?Worsening vision. ?Worsening sensitivity to light or touch. ? ?Activity: ?During the first day, avoid bending over and reading.  You may resume reading and bending the next day. ?Do not drive or use heavy machinery for at least 24 hours. ?Avoid strenuous activities for 1 week.  Activities such as walking, treadmill, exercise bike, and climbing stairs are okay. ?Do not lift heavy (>20 pound) objects for 1 week. ?Do not do yardwork, gardening, or dirty housework (mopping, cleaning bathrooms, vacuuming, etc.) for 1 week. ?Do not swim or use a hot tub for 2 weeks. ?Ask your doctor when you can return to work. ? ?General Instructions: ?Take or apply prescription and over-the-counter medicines as directed by your doctor, including eyedrops and ointments. ?Resume medications discontinued prior to surgery, unless told otherwise by your doctor. ?Keep all follow up appointments as  scheduled. ? ?Contact a health care provider if: ?You have increased bruising around your eye. ?You have pain that is not helped with medication. ?You have a fever. ?You have fluid, pus, or blood coming from your eye or incision. ?Your sensitivity to light gets worse. ?You have spots (floaters) of flashing lights in your vision. ?You have nausea or vomiting. ? ?Go to the nearest emergency room or call 911 if: ?You have sudden loss of vision. ?You have severe, worsening eye pain. ? ?

## 2022-12-03 ENCOUNTER — Other Ambulatory Visit: Payer: Self-pay | Admitting: Primary Care

## 2022-12-03 DIAGNOSIS — E785 Hyperlipidemia, unspecified: Secondary | ICD-10-CM

## 2022-12-03 DIAGNOSIS — I10 Essential (primary) hypertension: Secondary | ICD-10-CM

## 2022-12-03 NOTE — Telephone Encounter (Signed)
Patient is due for CPE/follow up, this will be required prior to any further refills.  Please schedule.

## 2022-12-05 NOTE — Telephone Encounter (Signed)
Spoke to pt, scheduled cpe for 01/05/23

## 2022-12-06 ENCOUNTER — Ambulatory Visit
Admission: RE | Admit: 2022-12-06 | Discharge: 2022-12-06 | Disposition: A | Discharge status: Home / Self Care | Payer: PPO | Source: Ambulatory Visit | Attending: Ophthalmology | Admitting: Ophthalmology

## 2022-12-06 ENCOUNTER — Other Ambulatory Visit: Payer: Self-pay

## 2022-12-06 ENCOUNTER — Encounter
Admission: RE | Disposition: A | Discharge status: Home / Self Care | Payer: Self-pay | Source: Ambulatory Visit | Attending: Ophthalmology

## 2022-12-06 ENCOUNTER — Ambulatory Visit: Payer: PPO | Admitting: Anesthesiology

## 2022-12-06 ENCOUNTER — Encounter: Payer: Self-pay | Admitting: Ophthalmology

## 2022-12-06 DIAGNOSIS — I10 Essential (primary) hypertension: Secondary | ICD-10-CM | POA: Diagnosis not present

## 2022-12-06 DIAGNOSIS — H2512 Age-related nuclear cataract, left eye: Secondary | ICD-10-CM | POA: Insufficient documentation

## 2022-12-06 DIAGNOSIS — E785 Hyperlipidemia, unspecified: Secondary | ICD-10-CM | POA: Diagnosis not present

## 2022-12-06 DIAGNOSIS — Z87891 Personal history of nicotine dependence: Secondary | ICD-10-CM | POA: Insufficient documentation

## 2022-12-06 DIAGNOSIS — H25042 Posterior subcapsular polar age-related cataract, left eye: Secondary | ICD-10-CM | POA: Diagnosis not present

## 2022-12-06 HISTORY — PX: CATARACT EXTRACTION W/PHACO: SHX586

## 2022-12-06 SURGERY — PHACOEMULSIFICATION, CATARACT, WITH IOL INSERTION
Anesthesia: Monitor Anesthesia Care | Site: Eye | Laterality: Left

## 2022-12-06 MED ORDER — FENTANYL CITRATE (PF) 100 MCG/2ML IJ SOLN
INTRAMUSCULAR | Status: DC | PRN
  Administered 2022-12-06 (×2): 50 ug via INTRAVENOUS

## 2022-12-06 MED ORDER — SIGHTPATH DOSE#1 BSS IO SOLN
INTRAOCULAR | Status: DC | PRN
  Administered 2022-12-06: 15 mL

## 2022-12-06 MED ORDER — MIDAZOLAM HCL 2 MG/2ML IJ SOLN
INTRAMUSCULAR | Status: DC | PRN
  Administered 2022-12-06 (×2): 1 mg via INTRAVENOUS

## 2022-12-06 MED ORDER — MOXIFLOXACIN HCL 0.5 % OP SOLN
OPHTHALMIC | Status: DC | PRN
  Administered 2022-12-06: .2 mL via OPHTHALMIC

## 2022-12-06 MED ORDER — BRIMONIDINE TARTRATE-TIMOLOL 0.2-0.5 % OP SOLN
OPHTHALMIC | Status: DC | PRN
  Administered 2022-12-06: 1 [drp] via OPHTHALMIC

## 2022-12-06 MED ORDER — SIGHTPATH DOSE#1 BSS IO SOLN
INTRAOCULAR | Status: DC | PRN
  Administered 2022-12-06: 1 mL via INTRAMUSCULAR

## 2022-12-06 MED ORDER — ARMC OPHTHALMIC DILATING DROPS
1.0000 | OPHTHALMIC | Status: DC | PRN
  Administered 2022-12-06 (×3): 1 via OPHTHALMIC

## 2022-12-06 MED ORDER — TETRACAINE HCL 0.5 % OP SOLN
1.0000 [drp] | OPHTHALMIC | Status: DC | PRN
  Administered 2022-12-06 (×3): 1 [drp] via OPHTHALMIC

## 2022-12-06 MED ORDER — SIGHTPATH DOSE#1 NA CHONDROIT SULF-NA HYALURON 40-17 MG/ML IO SOLN
INTRAOCULAR | Status: DC | PRN
  Administered 2022-12-06: 1 mL via INTRAOCULAR

## 2022-12-06 MED ORDER — SIGHTPATH DOSE#1 BSS IO SOLN
INTRAOCULAR | Status: DC | PRN
  Administered 2022-12-06: 70 mL via OPHTHALMIC

## 2022-12-06 SURGICAL SUPPLY — 9 items
CATARACT SUITE SIGHTPATH (MISCELLANEOUS) ×1 IMPLANT
FEE CATARACT SUITE SIGHTPATH (MISCELLANEOUS) ×1 IMPLANT
GLOVE BIOGEL PI IND STRL 8 (GLOVE) ×1 IMPLANT
GLOVE SURG ENC TEXT LTX SZ8 (GLOVE) ×1 IMPLANT
LENS IOL TECNIS EYHANCE 19.5 (Intraocular Lens) IMPLANT
NDL FILTER BLUNT 18X1 1/2 (NEEDLE) ×1 IMPLANT
NEEDLE FILTER BLUNT 18X1 1/2 (NEEDLE) ×1 IMPLANT
SYR 3ML LL SCALE MARK (SYRINGE) ×1 IMPLANT
WATER STERILE IRR 250ML POUR (IV SOLUTION) ×1 IMPLANT

## 2022-12-06 NOTE — Anesthesia Postprocedure Evaluation (Signed)
Anesthesia Post Note  Patient: Alec Wilson  Procedure(s) Performed: CATARACT EXTRACTION PHACO AND INTRAOCULAR LENS PLACEMENT (IOC) LEFT  4.92  00:43.0 (Left: Eye)  Patient location during evaluation: PACU Anesthesia Type: MAC Level of consciousness: awake and alert Pain management: pain level controlled Vital Signs Assessment: post-procedure vital signs reviewed and stable Respiratory status: spontaneous breathing, nonlabored ventilation, respiratory function stable and patient connected to nasal cannula oxygen Cardiovascular status: stable and blood pressure returned to baseline Postop Assessment: no apparent nausea or vomiting Anesthetic complications: no   No notable events documented.   Last Vitals:  Vitals:   12/06/22 1121 12/06/22 1122  BP:  133/84  Pulse:  74  Resp:  10  Temp: 36.5 C   SpO2:  98%    Last Pain:  Vitals:   12/06/22 1121  TempSrc:   PainSc: 0-No pain                 Ilene Qua

## 2022-12-06 NOTE — H&P (Signed)
[facility]   Primary Care Physician:  Pleas Koch, NP Ophthalmologist: Dr. George Ina  Pre-Procedure History & Physical: HPI:  Alec Wilson is a 72 y.o. male here for cataract surgery.   Past Medical History:  Diagnosis Date   Acute viral sinusitis 12/14/2018   Adenomatous colon polyp    last colonoscopy was 3 years ago.   Diverticulosis    Hemorrhoids summer 2005   s/p ligation of internal hemorrhoids   HTN (hypertension)    Hyperlipidemia    Inguinal hernia    PAF (paroxysmal atrial fibrillation) ([place]) 1999   Stress ECHO-Negative (09/22/98) -> no further documented evidence of A. fib   RMSF [facility] spotted fever) 05/2004   Sore throat 09/08/2021   Tubular adenoma of colon     Past Surgical History:  Procedure Laterality Date   CARDIAC EVENT MONITOR  10/2019   Baseline sample showed sinus rhythm with average rate 79 bpm.  Minimum heart rate 48 bpm.  Maximum heart rate sinus tachycardia 159 bpm. <1% PVCs.  No A. fib.  Bradycardia was noted during sleeping hours.  Patient had one episode of sensation of fluttering or skipped beats during which she had a short run of sinus rhythm with PVCs in bigeminy.   COLONOSCOPY  2013   HEMORRHOID SURGERY  2005   INGUINAL HERNIA REPAIR Right 2019   NM MYOVIEW LTD  11/2019   EF 60%..  Medium size, mild severity fixed apical defect with normal wall motion consistent with artifact.  LOW Risk   THROAT SURGERY  childhood   TRANSTHORACIC ECHOCARDIOGRAM  12/04/2019    EF 60 to 65%.  No R WMA.  Mild basal septal hypertrophy.  Normal RV function but mildly increased size.  Normal atrial sizes.  Mild aortic sclerosis, no stenosis.   VASECTOMY      Prior to Admission medications   Medication Sig Start Date End Date Taking? Authorizing Provider  aspirin 81 MG tablet Take 81 mg by mouth daily.   Yes [provider]  fish oil-omega-3 fatty acids 1000 MG capsule Take 1 g by mouth daily.   Yes [provider]   losartan (COZAAR) 25 MG tablet TAKE 1 TABLET BY MOUTH EVERY DAY FOR BLOOD PRESSURE 12/03/22  Yes Pleas Koch, NP  Multiple Vitamins-Minerals (MULTIVITAMIN PO) Take 1 Centrum Silver multivitamin by mouth daily   Yes [provider]  simvastatin (ZOCOR) 20 MG tablet TAKE 1 TABLET DAILY IN EVENING FOR CHOLESTEROL 12/03/22  Yes Pleas Koch, NP    Allergies as of 10/27/2022   (No Known Allergies)    Family History  Problem Relation Age of Onset   Coronary artery disease Father 6       CABG at age 109, MI at 53.   Parkinson's disease Father    Colon polyps Father    Congestive Heart Failure Father    Hypertension Brother    Hypertension Mother    Arthritis Mother    Heart attack Mother 62   Cancer Maternal Grandmother    Cancer Maternal Grandfather    Heart attack Paternal Grandfather 72   Rectal cancer Neg Hx    Stomach cancer Neg Hx    Colon cancer Neg Hx    Esophageal cancer Neg Hx     Social History   Socioeconomic History   Marital status: Married    Spouse name: Not on file   Number of children: 2   Years of education: Not on file  Highest education level: Not on file  Occupational History   Occupation: retired  Tobacco Use   Smoking status: Former    Packs/day: 1.00    Types: Cigarettes   Smokeless tobacco: Former    Quit date: 02/19/1998  Vaping Use   Vaping Use: Never used  Substance and Sexual Activity   Alcohol use: Yes    Alcohol/week: 2.0 standard drinks of alcohol    Types: 2 Glasses of wine per week    Comment: a glass of wine weekly   Drug use: No   Sexual activity: Yes    Birth control/protection: Surgical  Other Topics Concern   Not on file  Social History Narrative   From [place].   Works at [employer] in [employer].   Married.  2 children: 39, 27.    Daughters live in [place] and [place].   Enjoys golfing, visiting vineyards, traveling --> exercises 6 days a week 1 walking and playing golf      Quit  smoking in 1999.   Drinks roughly 5 glasses of wine a week.   Social Determinants of Health   Financial Resource Strain: Low Risk  (12/06/2021)   Overall Financial Resource Strain (CARDIA)    Difficulty of Paying Living Expenses: Not hard at all  Food Insecurity: No Food Insecurity (12/06/2021)   Hunger Vital Sign    Worried About Running Out of Food in the Last Year: Never true    Ran Out of Food in the Last Year: Never true  Transportation Needs: No Transportation Needs (12/06/2021)   PRAPARE - Hydrologist (Medical): No    Lack of Transportation (Non-Medical): No  Physical Activity: Sufficiently Active (12/06/2021)   Exercise Vital Sign    Days of Exercise per Week: 7 days    Minutes of Exercise per Session: 60 min  Stress: No Stress Concern Present (12/06/2021)   [place]    Feeling of Stress : Not at all  Social Connections: Moderately Integrated (12/06/2021)   Social Connection and Isolation Panel [NHANES]    Frequency of Communication with Friends and Family: More than three times a week    Frequency of Social Gatherings with Friends and Family: More than three times a week    Attends Religious Services: More than 4 times per year    Active Member of [employer] or Organizations: No    Attends Archivist Meetings: Never    Marital Status: Married  Human resources officer Violence: Not At Risk (12/06/2021)   Humiliation, Afraid, Rape, and Kick questionnaire    Fear of Current or Ex-Partner: No    Emotionally Abused: No    Physically Abused: No    Sexually Abused: No    Review of Systems: See HPI, otherwise negative ROS  Physical Exam: BP 136/69   Temp 98 F (36.7 C) (Tympanic)   Ht 5' 7.99" (1.727 m)   Wt 82.5 kg   SpO2 98%   BMI 27.66 kg/m  General:   Alert, cooperative in NAD Head:  Normocephalic and atraumatic. Respiratory:  Normal work of breathing. Cardiovascular:   RRR  Impression/Plan: [name] is here for cataract surgery.  Risks, benefits, limitations, and alternatives regarding cataract surgery have been reviewed with the patient.  Questions have been answered.  All parties agreeable.   Birder Robson, MD  12/06/2022, 10:53 AM

## 2022-12-06 NOTE — Op Note (Signed)
PREOPERATIVE DIAGNOSIS:  Nuclear sclerotic cataract of the left eye.   POSTOPERATIVE DIAGNOSIS:  Nuclear sclerotic cataract of the left eye.   OPERATIVE PROCEDURE:ORPROCALL@   SURGEON:  Birder Robson, MD.   ANESTHESIA:  Anesthesiologist: Ilene Qua, MD CRNA: Moises Blood, CRNA  1.      Managed anesthesia care. 2.     0.64m of Shugarcaine was instilled following the paracentesis   COMPLICATIONS:  None.   TECHNIQUE:   Stop and chop   DESCRIPTION OF PROCEDURE:  The patient was examined and consented in the preoperative holding area where the aforementioned topical anesthesia was applied to the left eye and then brought back to the Operating Room where the left eye was prepped and draped in the usual sterile ophthalmic fashion and a lid speculum was placed. A paracentesis was created with the side port blade and the anterior chamber was filled with viscoelastic. A near clear corneal incision was performed with the steel keratome. A continuous curvilinear capsulorrhexis was performed with a cystotome followed by the capsulorrhexis forceps. Hydrodissection and hydrodelineation were carried out with BSS on a blunt cannula. The lens was removed in a stop and chop  technique and the remaining cortical material was removed with the irrigation-aspiration handpiece. The capsular bag was inflated with viscoelastic and the Technis ZCB00 lens was placed in the capsular bag without complication. The remaining viscoelastic was removed from the eye with the irrigation-aspiration handpiece. The wounds were hydrated. The anterior chamber was flushed with BSS and the eye was inflated to physiologic pressure. 0.113mVigamox was placed in the anterior chamber. The wounds were found to be water tight. The eye was dressed with Combigan. The patient was given protective glasses to wear throughout the day and a shield with which to sleep tonight. The patient was also given drops with which to begin a drop regimen  today and will follow-up with me in one day. Implant Name Type Inv. Item Serial No. Manufacturer Lot No. LRB No. Used Action  LENS IOL TECNIS EYHANCE 19.5 - [id number] Lens LENS IOL TECNIS EYHANCE 19.5 [id number]  Left 1 Implanted    Procedure(s): CATARACT EXTRACTION PHACO AND INTRAOCULAR LENS PLACEMENT (IOC) LEFT  4.92  00:43.0 (Left)  Electronically signed: WiBirder Robson/16/2024 11:20 AM

## 2022-12-06 NOTE — Transfer of Care (Signed)
Immediate Anesthesia Transfer of Care Note  Patient: Alec Wilson  Procedure(s) Performed: CATARACT EXTRACTION PHACO AND INTRAOCULAR LENS PLACEMENT (IOC) LEFT  4.92  00:43.0 (Left: Eye)  Patient Location: PACU  Anesthesia Type: MAC  Level of Consciousness: awake, alert  and patient cooperative  Airway and Oxygen Therapy: Patient Spontanous Breathing and Patient connected to supplemental oxygen  Post-op Assessment: Post-op Vital signs reviewed, Patient's Cardiovascular Status Stable, Respiratory Function Stable, Patent Airway and No signs of Nausea or vomiting  Post-op Vital Signs: Reviewed and stable  Complications: No notable events documented.

## 2022-12-06 NOTE — Anesthesia Preprocedure Evaluation (Signed)
Anesthesia Evaluation  Patient identified by MRN, date of birth, ID band Patient awake    Reviewed: Allergy & Precautions, NPO status , Patient's Chart, lab work & pertinent test results  Airway Mallampati: III  TM Distance: >3 FB Neck ROM: full    Dental no notable dental hx.    Pulmonary neg pulmonary ROS, former smoker   Pulmonary exam normal        Cardiovascular hypertension, + dysrhythmias Atrial Fibrillation      Neuro/Psych negative neurological ROS  negative psych ROS   GI/Hepatic negative GI ROS, Neg liver ROS,,,  Endo/Other  negative endocrine ROS    Renal/GU negative Renal ROS  negative genitourinary   Musculoskeletal   Abdominal   Peds  Hematology negative hematology ROS (+)   Anesthesia Other Findings Past Medical History: 12/14/2018: Acute viral sinusitis No date: Adenomatous colon polyp     Comment:  last colonoscopy was 3 years ago. No date: Diverticulosis summer 2005: Hemorrhoids     Comment:  s/p ligation of internal hemorrhoids No date: HTN (hypertension) No date: Hyperlipidemia No date: Inguinal hernia 1999: PAF (paroxysmal atrial fibrillation) ([place])     Comment:  Stress ECHO-Negative (09/22/98) -> no further documented              evidence of A. fib 05/2004: [facility] spotted fever) 09/08/2021: Sore throat No date: Tubular adenoma of colon  Past Surgical History: 10/2019: CARDIAC EVENT MONITOR     Comment:  Baseline sample showed sinus rhythm with average rate 79              bpm.  Minimum heart rate 48 bpm.  Maximum heart rate               sinus tachycardia 159 bpm. <1% PVCs.  No A. fib.                Bradycardia was noted during sleeping hours.  Patient had              one episode of sensation of fluttering or skipped beats               during which she had a short run of sinus rhythm with               PVCs in bigeminy. 2013: COLONOSCOPY 2005: [place] 2019: INGUINAL HERNIA REPAIR; Right 11/2019: [facility]     Comment:  EF 60%..  Medium size, mild severity fixed apical defect              with normal wall motion consistent with artifact.  LOW               Risk childhood: THROAT SURGERY 12/04/2019: TRANSTHORACIC ECHOCARDIOGRAM     Comment:   EF 60 to 65%.  No R WMA.  Mild basal septal               hypertrophy.  Normal RV function but mildly increased               size.  Normal atrial sizes.  Mild aortic sclerosis, no               stenosis. No date: VASECTOMY  BMI    Body Mass Index: 27.66 kg/m      Reproductive/Obstetrics negative OB ROS  Anesthesia Physical Anesthesia Plan  ASA: 2  Anesthesia Plan: MAC   Post-op Pain Management: Minimal or no pain anticipated   Induction: Intravenous  PONV Risk Score and Plan:   Airway Management Planned: Natural Airway and Nasal Cannula  Additional Equipment:   Intra-op Plan:   Post-operative Plan:   Informed Consent: I have reviewed the patients History and Physical, chart, labs and discussed the procedure including the risks, benefits and alternatives for the proposed anesthesia with the patient or authorized representative who has indicated his/her understanding and acceptance.     Dental Advisory Given  Plan Discussed with: Anesthesiologist, CRNA and Surgeon  Anesthesia Plan Comments: (Patient consented for risks of anesthesia including but not limited to:  - adverse reactions to medications - damage to eyes, teeth, lips or other oral mucosa - nerve damage due to positioning  - sore throat or hoarseness - Damage to heart, brain, nerves, lungs, other parts of body or loss of life  Patient voiced understanding.)        Anesthesia Quick Evaluation

## 2022-12-07 ENCOUNTER — Ambulatory Visit (INDEPENDENT_AMBULATORY_CARE_PROVIDER_SITE_OTHER): Payer: PPO

## 2022-12-07 ENCOUNTER — Encounter: Payer: Self-pay | Admitting: Ophthalmology

## 2022-12-07 VITALS — Wt 179.0 lb

## 2022-12-07 DIAGNOSIS — Z0000 Encounter for general adult medical examination without abnormal findings: Secondary | ICD-10-CM

## 2022-12-07 NOTE — Patient Instructions (Signed)
Alec Wilson , Thank you for taking time to come for your Medicare Wellness Visit. I appreciate your ongoing commitment to your health goals. Please review the following plan we discussed and let me know if I can assist you in the future.   These are the goals we discussed:  Goals      Patient Stated     10/29/2019, I will maintain and continue medications as prescribed.      Patient Stated     12/04/2020, I will continue to walk everyday for about 5 miles.      Patient Stated     Would like to lose 5lbs     Patient Stated     Lose 5 lbs         This is a list of the screening recommended for you and due dates:  Health Maintenance  Topic Date Due   DTaP/Tdap/Td vaccine (2 - Td or Tdap) 10/15/2017   COVID-19 Vaccine (7 - 2023-24 season) 10/24/2022   Medicare Annual Wellness Visit  12/08/2023   Colon Cancer Screening  06/21/2029   Pneumonia Vaccine  Completed   Flu Shot  Completed   Hepatitis C Screening: USPSTF Recommendation to screen - Ages 18-79 yo.  Completed   Zoster (Shingles) Vaccine  Completed   HPV Vaccine  Aged Out    Advanced directives: Advance directive discussed with you today. Even though you declined this today please call our office should you change your mind and we can give you the proper paperwork for you to fill out.  Conditions/risks identified: lose 5 lbs   Next appointment: Follow up in one year for your annual wellness visit.   Preventive Care 35 Years and Older, Male  Preventive care refers to lifestyle choices and visits with your health care provider that can promote health and wellness. What does preventive care include? A yearly physical exam. This is also called an annual well check. Dental exams once or twice a year. Routine eye exams. Ask your health care provider how often you should have your eyes checked. Personal lifestyle choices, including: Daily care of your teeth and gums. Regular physical activity. Eating a healthy diet. Avoiding  tobacco and drug use. Limiting alcohol use. Practicing safe sex. Taking low doses of aspirin every day. Taking vitamin and mineral supplements as recommended by your health care provider. What happens during an annual well check? The services and screenings done by your health care provider during your annual well check will depend on your age, overall health, lifestyle risk factors, and family history of disease. Counseling  Your health care provider may ask you questions about your: Alcohol use. Tobacco use. Drug use. Emotional well-being. Home and relationship well-being. Sexual activity. Eating habits. History of falls. Memory and ability to understand (cognition). Work and work Statistician. Screening  You may have the following tests or measurements: Height, weight, and BMI. Blood pressure. Lipid and cholesterol levels. These may be checked every 5 years, or more frequently if you are over 68 years old. Skin check. Lung cancer screening. You may have this screening every year starting at age 80 if you have a 30-pack-year history of smoking and currently smoke or have quit within the past 15 years. Fecal occult blood test (FOBT) of the stool. You may have this test every year starting at age 20. Flexible sigmoidoscopy or colonoscopy. You may have a sigmoidoscopy every 5 years or a colonoscopy every 10 years starting at age 93. Prostate cancer screening. Recommendations will  vary depending on your family history and other risks. Hepatitis C blood test. Hepatitis B blood test. Sexually transmitted disease (STD) testing. Diabetes screening. This is done by checking your blood sugar (glucose) after you have not eaten for a while (fasting). You may have this done every 1-3 years. Abdominal aortic aneurysm (AAA) screening. You may need this if you are a current or former smoker. Osteoporosis. You may be screened starting at age 64 if you are at high risk. Talk with your health care  provider about your test results, treatment options, and if necessary, the need for more tests. Vaccines  Your health care provider may recommend certain vaccines, such as: Influenza vaccine. This is recommended every year. Tetanus, diphtheria, and acellular pertussis (Tdap, Td) vaccine. You may need a Td booster every 10 years. Zoster vaccine. You may need this after age 22. Pneumococcal 13-valent conjugate (PCV13) vaccine. One dose is recommended after age 7. Pneumococcal polysaccharide (PPSV23) vaccine. One dose is recommended after age 27. Talk to your health care provider about which screenings and vaccines you need and how often you need them. This information is not intended to replace advice given to you by your health care provider. Make sure you discuss any questions you have with your health care provider. Document Released: 12/04/2015 Document Revised: 07/27/2016 Document Reviewed: 09/08/2015 Elsevier Interactive Patient Education  2017 [employer] Prevention in the Home Falls can cause injuries. They can happen to people of all ages. There are many things you can do to make your home safe and to help prevent falls. What can I do on the outside of my home? Regularly fix the edges of walkways and driveways and fix any cracks. Remove anything that might make you trip as you walk through a door, such as a raised step or threshold. Trim any bushes or trees on the path to your home. Use bright outdoor lighting. Clear any walking paths of anything that might make someone trip, such as rocks or tools. Regularly check to see if handrails are loose or broken. Make sure that both sides of any steps have handrails. Any raised decks and porches should have guardrails on the edges. Have any leaves, snow, or ice cleared regularly. Use sand or salt on walking paths during winter. Clean up any spills in your garage right away. This includes oil or grease spills. What can I do in the  bathroom? Use night lights. Install grab bars by the toilet and in the tub and shower. Do not use towel bars as grab bars. Use non-skid mats or decals in the tub or shower. If you need to sit down in the shower, use a plastic, non-slip stool. Keep the floor dry. Clean up any water that spills on the floor as soon as it happens. Remove soap buildup in the tub or shower regularly. Attach bath mats securely with double-sided non-slip rug tape. Do not have throw rugs and other things on the floor that can make you trip. What can I do in the bedroom? Use night lights. Make sure that you have a light by your bed that is easy to reach. Do not use any sheets or blankets that are too big for your bed. They should not hang down onto the floor. Have a firm chair that has side arms. You can use this for support while you get dressed. Do not have throw rugs and other things on the floor that can make you trip. What can I do in  the kitchen? Clean up any spills right away. Avoid walking on wet floors. Keep items that you use a lot in easy-to-reach places. If you need to reach something above you, use a strong step stool that has a grab bar. Keep electrical cords out of the way. Do not use floor polish or wax that makes floors slippery. If you must use wax, use non-skid floor wax. Do not have throw rugs and other things on the floor that can make you trip. What can I do with my stairs? Do not leave any items on the stairs. Make sure that there are handrails on both sides of the stairs and use them. Fix handrails that are broken or loose. Make sure that handrails are as long as the stairways. Check any carpeting to make sure that it is firmly attached to the stairs. Fix any carpet that is loose or worn. Avoid having throw rugs at the top or bottom of the stairs. If you do have throw rugs, attach them to the floor with carpet tape. Make sure that you have a light switch at the top of the stairs and the  bottom of the stairs. If you do not have them, ask someone to add them for you. What else can I do to help prevent falls? Wear shoes that: Do not have high heels. Have rubber bottoms. Are comfortable and fit you well. Are closed at the toe. Do not wear sandals. If you use a stepladder: Make sure that it is fully opened. Do not climb a closed stepladder. Make sure that both sides of the stepladder are locked into place. Ask someone to hold it for you, if possible. Clearly mark and make sure that you can see: Any grab bars or handrails. First and last steps. Where the edge of each step is. Use tools that help you move around (mobility aids) if they are needed. These include: Canes. Walkers. Scooters. Crutches. Turn on the lights when you go into a dark area. Replace any light bulbs as soon as they burn out. Set up your furniture so you have a clear path. Avoid moving your furniture around. If any of your floors are uneven, fix them. If there are any pets around you, be aware of where they are. Review your medicines with your doctor. Some medicines can make you feel dizzy. This can increase your chance of falling. Ask your doctor what other things that you can do to help prevent falls. This information is not intended to replace advice given to you by your health care provider. Make sure you discuss any questions you have with your health care provider. Document Released: 09/03/2009 Document Revised: 04/14/2016 Document Reviewed: 12/12/2014 Elsevier Interactive Patient Education  2017 [employer].

## 2022-12-07 NOTE — Progress Notes (Signed)
I connected with  Alec Wilson on 12/07/22 by a audio enabled telemedicine application and verified that I am speaking with the correct person using two identifiers.  Patient Location: Home  Provider Location: Home Office  I discussed the limitations of evaluation and management by telemedicine. The patient expressed understanding and agreed to proceed.  Subjective:   Alec Wilson is a 72 y.o. male who presents for Medicare Annual/Subsequent preventive examination.  Review of Systems     Cardiac Risk Factors include: advanced age (>68mn, >>30women);hypertension;dyslipidemia;male gender     Objective:    Today's Vitals   12/07/22 0956  Weight: 179 lb (81.2 kg)   Body mass index is 27.22 kg/m.     12/07/2022   10:00 AM 12/06/2022   10:35 AM 12/06/2021    8:17 AM 12/04/2020    8:13 AM 10/29/2019   10:02 AM 04/22/2015    8:22 AM 04/08/2015    7:51 AM  Advanced Directives  Does Patient Have a Medical Advance Directive? No No Yes Yes Yes Yes Yes  Type of AComptrollerLiving will HMcCool JunctionLiving will HMcIntyreLiving will Healthcare Power of A[place]adaLiving will  Does patient want to make changes to medical advance directive?   Yes (MAU/Ambulatory/Procedural Areas - Information given)      Copy of HRebeccain Chart?    No - copy requested No - copy requested    Would patient like information on creating a medical advance directive? No - Patient declined Yes (MAU/Ambulatory/Procedural Areas - Information given)         Current Medications (verified) Outpatient Encounter Medications as of 12/07/2022  Medication Sig   aspirin 81 MG tablet Take 81 mg by mouth daily.   fish oil-omega-3 fatty acids 1000 MG capsule Take 1 g by mouth daily.   losartan (COZAAR) 25 MG tablet TAKE 1 TABLET BY MOUTH EVERY DAY FOR BLOOD PRESSURE   Multiple Vitamins-Minerals (MULTIVITAMIN  PO) Take 1 Centrum Silver multivitamin by mouth daily   simvastatin (ZOCOR) 20 MG tablet TAKE 1 TABLET DAILY IN EVENING FOR CHOLESTEROL   No facility-administered encounter medications on file as of 12/07/2022.    Allergies (verified) Patient has no known allergies.   History: Past Medical History:  Diagnosis Date   Acute viral sinusitis 12/14/2018   Adenomatous colon polyp    last colonoscopy was 3 years ago.   Diverticulosis    Hemorrhoids summer 2005   s/p ligation of internal hemorrhoids   HTN (hypertension)    Hyperlipidemia    Inguinal hernia    [place]   Stress ECHO-Negative (09/22/98) -> no further documented evidence of A. fib   [facility]   Sore throat 09/08/2021   Tubular adenoma of colon    Past Surgical History:  Procedure Laterality Date   CARDIAC EVENT MONITOR  10/2019   Baseline sample showed sinus rhythm with average rate 79 bpm.  Minimum heart rate 48 bpm.  Maximum heart rate sinus tachycardia 159 bpm. <1% PVCs.  No A. fib.  Bradycardia was noted during sleeping hours.  Patient had one episode of sensation of fluttering or skipped beats during which she had a short run of sinus rhythm with PVCs in bigeminy.   COLONOSCOPY  2013   HEMORRHOID SURGERY  2005   INGUINAL HERNIA REPAIR Right 2019   NM MYOVIEW LTD  11/2019  EF 60%..  Medium size, mild severity fixed apical defect with normal wall motion consistent with artifact.  LOW Risk   THROAT SURGERY  childhood   TRANSTHORACIC ECHOCARDIOGRAM  12/04/2019    EF 60 to 65%.  No R WMA.  Mild basal septal hypertrophy.  Normal RV function but mildly increased size.  Normal atrial sizes.  Mild aortic sclerosis, no stenosis.   VASECTOMY     Family History  Problem Relation Age of Onset   Coronary artery disease Father 28       CABG at age 20, MI at 26.   Parkinson's disease Father    Colon polyps Father    Congestive Heart Failure Father     Hypertension Brother    Hypertension Mother    Arthritis Mother    Heart attack Mother 5   Cancer Maternal Grandmother    Cancer Maternal Grandfather    Heart attack Paternal Grandfather 35   Rectal cancer Neg Hx    Stomach cancer Neg Hx    Colon cancer Neg Hx    Esophageal cancer Neg Hx    Social History   Socioeconomic History   Marital status: Married    Spouse name: Not on file   Number of children: 2   Years of education: Not on file   Highest education level: Not on file  Occupational History   Occupation: retired  Tobacco Use   Smoking status: Former    Packs/day: 1.00    Types: Cigarettes   Smokeless tobacco: Former    Quit date: 02/19/1998  Vaping Use   Vaping Use: Never used  Substance and Sexual Activity   Alcohol use: Yes    Alcohol/week: 2.0 standard drinks of alcohol    Types: 2 Glasses of wine per week    Comment: a glass of wine weekly   Drug use: No   Sexual activity: Yes    Birth control/protection: Surgical  Other Topics Concern   Not on file  Social History Narrative   From [place].   Works at [employer] in [employer].   Married.  2 children: 39, 27.    Daughters live in [place] and [place].   Enjoys golfing, visiting vineyards, traveling --> exercises 6 days a week 1 walking and playing golf      Quit smoking in 1999.   Drinks roughly 5 glasses of wine a week.   Social Determinants of Health   Financial Resource Strain: Low Risk  (12/06/2021)   Overall Financial Resource Strain (CARDIA)    Difficulty of Paying Living Expenses: Not hard at all  Food Insecurity: No Food Insecurity (12/06/2021)   Hunger Vital Sign    Worried About Running Out of Food in the Last Year: Never true    Ran Out of Food in the Last Year: Never true  Transportation Needs: No Transportation Needs (12/06/2021)   PRAPARE - Hydrologist (Medical): No    Lack of Transportation (Non-Medical): No  Physical Activity: Sufficiently  Active (12/06/2021)   Exercise Vital Sign    Days of Exercise per Week: 7 days    Minutes of Exercise per Session: 60 min  Stress: No Stress Concern Present (12/06/2021)   [place]    Feeling of Stress : Not at all  Social Connections: Unknown (12/03/2022)   Social Connection and Isolation Panel [NHANES]    Frequency of Communication with Friends and Family: Not  on file    Frequency of Social Gatherings with Friends and Family: Not on file    Attends Religious Services: More than 4 times per year    Active Member of [employer] or Organizations: Not on file    Attends Archivist Meetings: Not on file    Marital Status: Not on file    Tobacco Counseling Counseling given: Not Answered   Clinical Intake:  Pre-visit preparation completed: Yes  Pain : No/denies pain     BMI - recorded: 27.22 Nutritional Status: BMI 25 -29 Overweight Nutritional Risks: None Diabetes: No  How often do you need to have someone help you when you read instructions, pamphlets, or other written materials from your doctor or pharmacy?: 1 - Never  Diabetic?no  Interpreter Needed?: No  Information entered by :: Charlott Rakes, LPN   Activities of Daily Living    12/06/2022   10:34 AM 12/03/2022    9:32 AM  In your present state of health, do you have any difficulty performing the following activities:  Hearing? 0 0  Vision? 0 0  Difficulty concentrating or making decisions? 0 0  Walking or climbing stairs? 0 0  Dressing or bathing? 0 0  Doing errands, shopping?  0  Preparing Food and eating ?  N  Using the Toilet?  N  In the past six months, have you accidently leaked urine?  N  Do you have problems with loss of bowel control?  N  Managing your Medications?  N  Managing your Finances?  N  Housekeeping or managing your Housekeeping?  N    Patient Care Team: Pleas Koch, NP as PCP - General (Nurse  Practitioner) Gatha Mayer, MD (Gastroenterology) Romeo Apple, MD (Inactive) (Cardiology)  Indicate any recent Medical Services you may have received from other than [facility] providers in the past year (date may be approximate).     Assessment:   This is a routine wellness examination for [place].  Hearing/Vision screen Hearing Screening - Comments:: Pt denies any hearing issues  Vision Screening - Comments:: Pt follows up with [facility] for annual eye exams   Dietary issues and exercise activities discussed: Current Exercise Habits: Home exercise routine, Type of exercise: Other - see comments, Time (Minutes): 50, Frequency (Times/Week): 6, Weekly Exercise (Minutes/Week): 300   Goals Addressed             This Visit's Progress    Patient Stated       Lose 5 lbs        Depression Screen    12/07/2022    9:59 AM 12/07/2021    8:37 AM 12/06/2021    8:20 AM 12/04/2020    8:15 AM 11/10/2020   10:28 AM 10/29/2019   10:03 AM 10/01/2018    2:44 PM  PHQ 2/9 Scores  PHQ - 2 Score 0 0 0 0 0 0 0  PHQ- 9 Score  0  0 0 0     Fall Risk    12/03/2022    9:32 AM 12/07/2021    8:39 AM 12/06/2021    8:19 AM 12/04/2020    8:14 AM 11/10/2020   10:28 AM  Fall Risk   Falls in the past year? 0 0 0 0 0  Number falls in past yr: 0 0 0 0 0  Injury with Fall? 0 0 0 0 0  Risk for fall due to : Impaired vision   Medication side effect   Follow up Falls prevention  discussed  Falls prevention discussed Falls evaluation completed;Falls prevention discussed     FALL RISK PREVENTION PERTAINING TO THE HOME:  Any stairs in or around the home? Yes  If so, are there any without handrails? No  Home free of loose throw rugs in walkways, pet beds, electrical cords, etc? Yes  Adequate lighting in your home to reduce risk of falls? Yes   ASSISTIVE DEVICES UTILIZED TO PREVENT FALLS:  Life alert? No  Use of a cane, walker or w/c? No  Grab bars in the bathroom? Yes  Shower chair or bench in  shower? No  Elevated toilet seat or a handicapped toilet? No   TIMED UP AND GO:  Was the test performed? No .  Cognitive Function:    12/04/2020    8:17 AM 10/29/2019   10:05 AM  MMSE - Mini Mental State Exam  Orientation to time 5 5  Orientation to Place 5 5  Registration 3 3  Attention/ Calculation 5 5  Recall 3 3  Language- repeat 1 1        12/07/2022   10:02 AM  6CIT Screen  What Year? 0 points  What month? 0 points  What time? 0 points  Count back from 20 0 points  Months in reverse 0 points  Repeat phrase 0 points  Total Score 0 points    Immunizations Immunization History  Administered Date(s) Administered   COVID-19, mRNA, vaccine(Comirnaty)12 years and older 08/29/2022   Fluad Quad(high Dose 65+) 09/08/2020, 08/24/2021   Influenza Split 12/01/2011   Influenza,inj,Quad PF,6+ Mos 10/07/2016, 08/25/2017, 08/23/2018, 08/29/2019   Influenza-Unspecified 08/30/2014, 08/21/2015, 08/23/2021, 08/29/2022   PFIZER Comirnaty(Gray Top)Covid-19 Tri-Sucrose Vaccine 05/25/2021   PFIZER(Purple Top)SARS-COV-2 Vaccination 12/27/2019, 01/21/2020, 09/21/2020   Pfizer Covid-19 Vaccine Bivalent Booster 83yr & up 09/21/2021   Pneumococcal Conjugate-13 10/01/2018   Pneumococcal Polysaccharide-23 11/06/2019   Tdap 10/16/2007   Zoster Recombinat (Shingrix) 05/13/2020, 07/14/2020    TDAP status: Due, Education has been provided regarding the importance of this vaccine. Advised may receive this vaccine at local pharmacy or Health Dept. Aware to provide a copy of the vaccination record if obtained from local pharmacy or Health Dept. Verbalized acceptance and understanding.  Flu Vaccine status: Up to date  Pneumococcal vaccine status: Up to date  Covid-19 vaccine status: Completed vaccines  Qualifies for Shingles Vaccine? Yes   Zostavax completed Yes   Shingrix Completed?: Yes  Screening Tests Health Maintenance  Topic Date Due   DTaP/Tdap/Td (2 - Td or Tdap) 10/15/2017    COVID-19 Vaccine (7 - 2023-24 season) 10/24/2022   Medicare Annual Wellness (AWV)  12/08/2023   COLONOSCOPY (Pts 45-470yrInsurance coverage will need to be confirmed)  06/21/2029   Pneumonia Vaccine 6528Years old  Completed   INFLUENZA VACCINE  Completed   Hepatitis C Screening  Completed   Zoster Vaccines- Shingrix  Completed   HPV VACCINES  Aged Out    Health Maintenance  Health Maintenance Due  Topic Date Due   DTaP/Tdap/Td (2 - Td or Tdap) 10/15/2017   COVID-19 Vaccine (7 - 2023-24 season) 10/24/2022    Colorectal cancer screening: Type of screening: Colonoscopy. Completed 06/21/22. Repeat every 7 years  Additional Screening:  Hepatitis C Screening:  Completed 11/10/20  Vision Screening: Recommended annual ophthalmology exams for early detection of glaucoma and other disorders of the eye. Is the patient up to date with their annual eye exam?  Yes  Who is the provider or what is the name of the office in  which the patient attends annual eye exams? [facility]  If pt is not established with a provider, would they like to be referred to a provider to establish care? No .   Dental Screening: Recommended annual dental exams for proper oral hygiene  Community Resource Referral / Chronic Care Management: CRR required this visit?  No   CCM required this visit?  No      Plan:     I have personally reviewed and noted the following in the patient's chart:   Medical and social history Use of alcohol, tobacco or illicit drugs  Current medications and supplements including opioid prescriptions. Patient is not currently taking opioid prescriptions. Functional ability and status Nutritional status Physical activity Advanced directives List of other physicians Hospitalizations, surgeries, and ER visits in previous 12 months Vitals Screenings to include cognitive, depression, and falls Referrals and appointments  In addition, I have reviewed and discussed with patient  certain preventive protocols, quality metrics, and best practice recommendations. A written personalized care plan for preventive services as well as general preventive health recommendations were provided to patient.     Willette Brace, LPN   [id number]/59/[id number]   Nurse Notes: none

## 2022-12-12 DIAGNOSIS — H2512 Age-related nuclear cataract, left eye: Secondary | ICD-10-CM | POA: Diagnosis not present

## 2022-12-12 DIAGNOSIS — H2511 Age-related nuclear cataract, right eye: Secondary | ICD-10-CM | POA: Diagnosis not present

## 2022-12-15 NOTE — Discharge Instructions (Signed)
   Cataract Surgery, Care After ? ?This sheet gives you information about how to care for yourself after your surgery.  Your ophthalmologist may also give you more specific instructions.  If you have problems or questions, contact your doctor at [facility], [phone]. ? ?What can I expect after the surgery? ?It is common to have: ?Itching ?Foreign body sensation (feels like a grain of sand in the eye) ?Watery discharge (excess tearing) ?Sensitivity to light and touch ?Bruising in or around the eye ?Mild blurred vision ? ?Follow these instructions at home: ?Do not touch or rub your eyes. ?You may be told to wear a protective shield or sunglasses to protect your eyes. ?Do not put a contact lens in the operative eye unless your doctor approves. ?Keep the lids and face clean and dry. ?Do not allow water to hit you directly in the face while showering. ?Keep soap and shampoo out of your eyes. ?Do not use eye makeup for 1 week. ? ?Check your eye every day for signs of infection.  Watch for: ?Redness, swelling, or pain. ?Fluid, blood or pus. ?Worsening vision. ?Worsening sensitivity to light or touch. ? ?Activity: ?During the first day, avoid bending over and reading.  You may resume reading and bending the next day. ?Do not drive or use heavy machinery for at least 24 hours. ?Avoid strenuous activities for 1 week.  Activities such as walking, treadmill, exercise bike, and climbing stairs are okay. ?Do not lift heavy (>20 pound) objects for 1 week. ?Do not do yardwork, gardening, or dirty housework (mopping, cleaning bathrooms, vacuuming, etc.) for 1 week. ?Do not swim or use a hot tub for 2 weeks. ?Ask your doctor when you can return to work. ? ?General Instructions: ?Take or apply prescription and over-the-counter medicines as directed by your doctor, including eyedrops and ointments. ?Resume medications discontinued prior to surgery, unless told otherwise by your doctor. ?Keep all follow up appointments as  scheduled. ? ?Contact a health care provider if: ?You have increased bruising around your eye. ?You have pain that is not helped with medication. ?You have a fever. ?You have fluid, pus, or blood coming from your eye or incision. ?Your sensitivity to light gets worse. ?You have spots (floaters) of flashing lights in your vision. ?You have nausea or vomiting. ? ?Go to the nearest emergency room or call 911 if: ?You have sudden loss of vision. ?You have severe, worsening eye pain. ? ?

## 2022-12-20 ENCOUNTER — Encounter: Payer: Self-pay | Admitting: Ophthalmology

## 2022-12-20 ENCOUNTER — Ambulatory Visit: Payer: PPO | Admitting: Anesthesiology

## 2022-12-20 ENCOUNTER — Other Ambulatory Visit: Payer: Self-pay

## 2022-12-20 ENCOUNTER — Ambulatory Visit
Admission: RE | Admit: 2022-12-20 | Discharge: 2022-12-20 | Disposition: A | Discharge status: Home / Self Care | Payer: PPO | Source: Ambulatory Visit | Attending: Ophthalmology | Admitting: Ophthalmology

## 2022-12-20 ENCOUNTER — Encounter
Admission: RE | Disposition: A | Discharge status: Home / Self Care | Payer: Self-pay | Source: Ambulatory Visit | Attending: Ophthalmology

## 2022-12-20 DIAGNOSIS — Z87891 Personal history of nicotine dependence: Secondary | ICD-10-CM | POA: Diagnosis not present

## 2022-12-20 DIAGNOSIS — H2511 Age-related nuclear cataract, right eye: Secondary | ICD-10-CM | POA: Diagnosis not present

## 2022-12-20 DIAGNOSIS — E785 Hyperlipidemia, unspecified: Secondary | ICD-10-CM | POA: Insufficient documentation

## 2022-12-20 DIAGNOSIS — I480 Paroxysmal atrial fibrillation: Secondary | ICD-10-CM | POA: Diagnosis not present

## 2022-12-20 DIAGNOSIS — I10 Essential (primary) hypertension: Secondary | ICD-10-CM | POA: Diagnosis not present

## 2022-12-20 DIAGNOSIS — Z8249 Family history of ischemic heart disease and other diseases of the circulatory system: Secondary | ICD-10-CM | POA: Insufficient documentation

## 2022-12-20 DIAGNOSIS — Z9852 Vasectomy status: Secondary | ICD-10-CM | POA: Diagnosis not present

## 2022-12-20 DIAGNOSIS — I4891 Unspecified atrial fibrillation: Secondary | ICD-10-CM | POA: Diagnosis not present

## 2022-12-20 DIAGNOSIS — H25041 Posterior subcapsular polar age-related cataract, right eye: Secondary | ICD-10-CM | POA: Diagnosis not present

## 2022-12-20 HISTORY — PX: CATARACT EXTRACTION W/PHACO: SHX586

## 2022-12-20 SURGERY — PHACOEMULSIFICATION, CATARACT, WITH IOL INSERTION
Anesthesia: Monitor Anesthesia Care | Site: Eye | Laterality: Right

## 2022-12-20 MED ORDER — SIGHTPATH DOSE#1 BSS IO SOLN
INTRAOCULAR | Status: DC | PRN
  Administered 2022-12-20: 1 mL via INTRAMUSCULAR

## 2022-12-20 MED ORDER — MIDAZOLAM HCL 2 MG/2ML IJ SOLN
INTRAMUSCULAR | Status: DC | PRN
  Administered 2022-12-20: 2 mg via INTRAVENOUS

## 2022-12-20 MED ORDER — ARMC OPHTHALMIC DILATING DROPS
1.0000 | OPHTHALMIC | Status: DC | PRN
  Administered 2022-12-20 (×3): 1 via OPHTHALMIC

## 2022-12-20 MED ORDER — SIGHTPATH DOSE#1 BSS IO SOLN
INTRAOCULAR | Status: DC | PRN
  Administered 2022-12-20: 75 mL via OPHTHALMIC

## 2022-12-20 MED ORDER — TETRACAINE HCL 0.5 % OP SOLN
1.0000 [drp] | OPHTHALMIC | Status: DC | PRN
  Administered 2022-12-20 (×3): 1 [drp] via OPHTHALMIC

## 2022-12-20 MED ORDER — SIGHTPATH DOSE#1 NA CHONDROIT SULF-NA HYALURON 40-17 MG/ML IO SOLN
INTRAOCULAR | Status: DC | PRN
  Administered 2022-12-20: 1 mL via INTRAOCULAR

## 2022-12-20 MED ORDER — FENTANYL CITRATE (PF) 100 MCG/2ML IJ SOLN
INTRAMUSCULAR | Status: DC | PRN
  Administered 2022-12-20 (×2): 50 ug via INTRAVENOUS

## 2022-12-20 MED ORDER — MOXIFLOXACIN HCL 0.5 % OP SOLN
OPHTHALMIC | Status: DC | PRN
  Administered 2022-12-20: .2 mL via OPHTHALMIC

## 2022-12-20 MED ORDER — SIGHTPATH DOSE#1 BSS IO SOLN
INTRAOCULAR | Status: DC | PRN
  Administered 2022-12-20: 15 mL

## 2022-12-20 MED ORDER — BRIMONIDINE TARTRATE-TIMOLOL 0.2-0.5 % OP SOLN
OPHTHALMIC | Status: DC | PRN
  Administered 2022-12-20: 1 [drp] via OPHTHALMIC

## 2022-12-20 SURGICAL SUPPLY — 9 items
CATARACT SUITE SIGHTPATH (MISCELLANEOUS) ×1 IMPLANT
FEE CATARACT SUITE SIGHTPATH (MISCELLANEOUS) ×1 IMPLANT
GLOVE BIOGEL PI IND STRL 8 (GLOVE) ×1 IMPLANT
GLOVE SURG ENC TEXT LTX SZ8 (GLOVE) ×1 IMPLANT
LENS IOL TECNIS EYHANCE 19.5 (Intraocular Lens) IMPLANT
NDL FILTER BLUNT 18X1 1/2 (NEEDLE) ×1 IMPLANT
NEEDLE FILTER BLUNT 18X1 1/2 (NEEDLE) ×1 IMPLANT
SYR 3ML LL SCALE MARK (SYRINGE) ×1 IMPLANT
WATER STERILE IRR 250ML POUR (IV SOLUTION) ×1 IMPLANT

## 2022-12-20 NOTE — H&P (Signed)
[facility]   Primary Care Physician:  Pleas Koch, NP Ophthalmologist: Dr. George Ina  Pre-Procedure History & Physical: HPI:  Alec Wilson is a 72 y.o. male here for cataract surgery.   Past Medical History:  Diagnosis Date   Acute viral sinusitis 12/14/2018   Adenomatous colon polyp    last colonoscopy was 3 years ago.   Diverticulosis    Hemorrhoids summer 2005   s/p ligation of internal hemorrhoids   HTN (hypertension)    Hyperlipidemia    Inguinal hernia    PAF (paroxysmal atrial fibrillation) ([place]) 1999   Stress ECHO-Negative (09/22/98) -> no further documented evidence of A. 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  Minimum heart rate 48 bpm.  Maximum heart rate sinus tachycardia 159 bpm. <1% PVCs.  No A. fib.  Bradycardia was noted during sleeping hours.  Patient had one episode of sensation of fluttering or skipped beats during which she had a short run of sinus rhythm with PVCs in bigeminy.   CATARACT EXTRACTION W/PHACO Left 12/06/2022   Procedure: CATARACT EXTRACTION PHACO AND INTRAOCULAR LENS PLACEMENT (IOC) LEFT  4.92  00:43.0;  Surgeon: Birder Robson, MD;  Location: [facility];  Service: Ophthalmology;  Laterality: Left;   COLONOSCOPY  2013   HEMORRHOID SURGERY  2005   INGUINAL HERNIA REPAIR Right 2019   NM MYOVIEW LTD  11/2019   EF 60%..  Medium size, mild severity fixed apical defect with normal wall motion consistent with artifact.  LOW Risk   THROAT SURGERY  childhood   TRANSTHORACIC ECHOCARDIOGRAM  12/04/2019    EF 60 to 65%.  No R WMA.  Mild basal septal hypertrophy.  Normal RV function but mildly increased size.  Normal atrial sizes.  Mild aortic sclerosis, no stenosis.   VASECTOMY      Prior to Admission medications    Medication Sig Start Date End Date Taking? Authorizing Provider  aspirin 81 MG tablet Take 81 mg by mouth daily.   Yes [provider]  fish oil-omega-3 fatty acids 1000 MG capsule Take 1 g by mouth daily.   Yes [provider]  losartan (COZAAR) 25 MG tablet TAKE 1 TABLET BY MOUTH EVERY DAY FOR BLOOD PRESSURE 12/03/22  Yes Pleas Koch, NP  Multiple Vitamins-Minerals (MULTIVITAMIN PO) Take 1 Centrum Silver multivitamin by mouth daily   Yes [provider]  simvastatin (ZOCOR) 20 MG tablet TAKE 1 TABLET DAILY IN EVENING FOR CHOLESTEROL 12/03/22  Yes Pleas Koch, NP    Allergies as of 10/27/2022   (No Known Allergies)    Family History  Problem Relation Age of Onset   Coronary artery disease Father 26       CABG at age 39, MI at 76.   Parkinson's disease Father    Colon polyps Father    Congestive Heart Failure Father    Hypertension Brother    Hypertension Mother    Arthritis Mother    Heart attack Mother 44   Cancer Maternal Grandmother    Cancer Maternal Grandfather    Heart attack Paternal Grandfather 69   Rectal cancer Neg Hx    Stomach cancer Neg Hx    Colon cancer Neg Hx    Esophageal cancer Neg  Hx     Social History   Socioeconomic History   Marital status: Married    Spouse name: Not on file   Number of children: 2   Years of education: Not on file   Highest education level: Not on file  Occupational History   Occupation: retired  Tobacco Use   Smoking status: Former    Packs/day: 1.00    Types: Cigarettes   Smokeless tobacco: Former    Quit date: 02/19/1998  Vaping Use   Vaping Use: Never used  Substance and Sexual Activity   Alcohol use: Yes    Alcohol/week: 2.0 standard drinks of alcohol    Types: 2 Glasses of wine per week    Comment: a glass of wine weekly   Drug use: No   Sexual activity: Yes    Birth control/protection: Surgical  Other Topics Concern   Not on file  Social History Narrative   From [place].   Works at [employer] in [employer].   Married.  2 children: 39, 27.    Daughters live in [place] and [place].   Enjoys golfing, visiting vineyards, traveling --> exercises 6 days a week 1 walking and playing golf      Quit smoking in 1999.   Drinks roughly 5 glasses of wine a week.   Social Determinants of Health   Financial Resource Strain: Low Risk  (12/06/2021)   Overall Financial Resource Strain (CARDIA)    Difficulty of Paying Living Expenses: Not hard at all  Food Insecurity: No Food Insecurity (12/06/2021)   Hunger Vital Sign    Worried About Running Out of Food in the Last Year: Never true    Ran Out of Food in the Last Year: Never true  Transportation Needs: No Transportation Needs (12/06/2021)   PRAPARE - Hydrologist (Medical): No    Lack of Transportation (Non-Medical): No  Physical Activity: Sufficiently Active (12/06/2021)   Exercise Vital Sign    Days of Exercise per Week: 7 days    Minutes of Exercise per Session: 60 min  Stress: No Stress Concern Present (12/06/2021)   [place]    Feeling of Stress : Not at all  Social Connections: Unknown (12/03/2022)   Social Connection and Isolation Panel [NHANES]    Frequency of Communication with Friends and Family: Not on file    Frequency of Social Gatherings with Friends and Family: Not on file    Attends Religious Services: More than 4 times per year    Active Member of [employer] or Organizations: Not on file    Attends Archivist Meetings: Not on file    Marital Status: Not on file  Intimate Partner Violence: Not At Risk (12/07/2022)   Humiliation, Afraid, Rape, and Kick questionnaire    Fear of Current or Ex-Partner: No    Emotionally Abused: No    Physically Abused: No    Sexually Abused: No    Review of Systems: See HPI, otherwise negative ROS  Physical Exam: BP 139/85   Pulse 63   Temp 98.4  F (36.9 C) (Temporal)   Resp 18   Ht '5\' 8"'$  (1.727 m)   Wt 82.3 kg   SpO2 99%   BMI 27.60 kg/m  General:   Alert, cooperative in NAD Head:  Normocephalic and atraumatic. Respiratory:  Normal work of breathing. Cardiovascular:  RRR  Impression/Plan: Alec Wilson is here for  cataract surgery.  Risks, benefits, limitations, and alternatives regarding cataract surgery have been reviewed with the patient.  Questions have been answered.  All parties agreeable.   Birder Robson, MD  12/20/2022, 9:04 AM

## 2022-12-20 NOTE — Op Note (Signed)
PREOPERATIVE DIAGNOSIS:  Nuclear sclerotic cataract of the right eye.   POSTOPERATIVE DIAGNOSIS:  H25.041 Cataract   OPERATIVE PROCEDURE:ORPROCALL@   SURGEON:  Birder Robson, MD.   ANESTHESIA:  Anesthesiologist: Iran Ouch, MD CRNA: Londell Moh, CRNA; Moises Blood, CRNA  1.      Managed anesthesia care. 2.      0.43m of Shugarcaine was instilled in the eye following the paracentesis.   COMPLICATIONS:  None.   TECHNIQUE:   Stop and chop   DESCRIPTION OF PROCEDURE:  The patient was examined and consented in the preoperative holding area where the aforementioned topical anesthesia was applied to the right eye and then brought back to the Operating Room where the right eye was prepped and draped in the usual sterile ophthalmic fashion and a lid speculum was placed. A paracentesis was created with the side port blade and the anterior chamber was filled with viscoelastic. A near clear corneal incision was performed with the steel keratome. A continuous curvilinear capsulorrhexis was performed with a cystotome followed by the capsulorrhexis forceps. Hydrodissection and hydrodelineation were carried out with BSS on a blunt cannula. The lens was removed in a stop and chop  technique and the remaining cortical material was removed with the irrigation-aspiration handpiece. The capsular bag was inflated with viscoelastic and the Technis [id number]  lens was placed in the capsular bag without complication. The remaining viscoelastic was removed from the eye with the irrigation-aspiration handpiece. The wounds were hydrated. The anterior chamber was flushed with BSS and the eye was inflated to physiologic pressure. 0.163mof Vigamox was placed in the anterior chamber. The wounds were found to be water tight. The eye was dressed with Combigan. The patient was given protective glasses to wear throughout the day and a shield with which to sleep tonight. The patient was also given drops with which to  begin a drop regimen today and will follow-up with me in one day. Implant Name Type Inv. Item Serial No. Manufacturer Lot No. LRB No. Used Action  LENS IOL TECNIS EYHANCE 19.5 - [id number] Lens LENS IOL TECNIS EYHANCE 19.5 [id number]  Right 1 Implanted   Procedure(s): CATARACT EXTRACTION PHACO AND INTRAOCULAR LENS PLACEMENT (IOC) RIGHT 4.05 00:26.7 (Right)  Electronically signed: WiBirder Robson/30/2024 9:29 AM

## 2022-12-20 NOTE — Anesthesia Preprocedure Evaluation (Addendum)
Anesthesia Evaluation  Patient identified by MRN, date of birth, ID band Patient awake    Reviewed: Allergy & Precautions, NPO status , Patient's Chart, lab work & pertinent test results  Airway Mallampati: III  TM Distance: >3 FB Neck ROM: full    Dental no notable dental hx.    Pulmonary former smoker   Pulmonary exam normal        Cardiovascular hypertension, + dysrhythmias Atrial Fibrillation  Rhythm:Regular     Neuro/Psych negative neurological ROS  negative psych ROS   GI/Hepatic negative GI ROS, Neg liver ROS,,,  Endo/Other  negative endocrine ROS    Renal/GU negative Renal ROS  negative genitourinary   Musculoskeletal   Abdominal Normal abdominal exam  (+)   Peds  Hematology negative hematology ROS (+)   Anesthesia Other Findings Past Medical History: 12/14/2018: Acute viral sinusitis No date: Adenomatous colon polyp     Comment:  last colonoscopy was 3 years ago. No date: Diverticulosis summer 2005: Hemorrhoids     Comment:  s/p ligation of internal hemorrhoids No date: HTN (hypertension) No date: Hyperlipidemia No date: Inguinal hernia 1999: PAF (paroxysmal atrial fibrillation) ([place])     Comment:  Stress ECHO-Negative (09/22/98) -> no further documented              evidence of A. fib 05/2004: RMSF [facility] spotted fever) 09/08/2021: Sore throat No date: Tubular adenoma of colon  Past Surgical History: 10/2019: CARDIAC EVENT MONITOR     Comment:  Baseline sample showed sinus rhythm with average rate 79              bpm.  Minimum heart rate 48 bpm.  Maximum heart rate               sinus tachycardia 159 bpm. <1% PVCs.  No A. fib.                Bradycardia was noted during sleeping hours.  Patient had              one episode of sensation of fluttering or skipped beats               during which she had a short run of sinus rhythm with               PVCs in bigeminy. 2013:  COLONOSCOPY 2005: [place] 2019: INGUINAL HERNIA REPAIR; Right 11/2019: [facility] MYOVIEW LTD     Comment:  EF 60%..  Medium size, mild severity fixed apical defect              with normal wall motion consistent with artifact.  LOW               Risk childhood: THROAT SURGERY 12/04/2019: TRANSTHORACIC ECHOCARDIOGRAM     Comment:   EF 60 to 65%.  No R WMA.  Mild basal septal               hypertrophy.  Normal RV function but mildly increased               size.  Normal atrial sizes.  Mild aortic sclerosis, no               stenosis. No date: VASECTOMY  BMI    Body Mass Index: 27.66 kg/m      Reproductive/Obstetrics negative OB ROS  Anesthesia Physical Anesthesia Plan  ASA: 2  Anesthesia Plan: MAC   Post-op Pain Management: Minimal or no pain anticipated   Induction: Intravenous  PONV Risk Score and Plan:   Airway Management Planned: Natural Airway and Nasal Cannula  Additional Equipment:   Intra-op Plan:   Post-operative Plan:   Informed Consent: I have reviewed the patients History and Physical, chart, labs and discussed the procedure including the risks, benefits and alternatives for the proposed anesthesia with the patient or authorized representative who has indicated his/her understanding and acceptance.     Dental Advisory Given  Plan Discussed with: Anesthesiologist, CRNA and Surgeon  Anesthesia Plan Comments:          Anesthesia Quick Evaluation

## 2022-12-20 NOTE — Anesthesia Postprocedure Evaluation (Signed)
Anesthesia Post Note  Patient: Sebastyan Snodgrass [place]  Procedure(s) Performed: CATARACT EXTRACTION PHACO AND INTRAOCULAR LENS PLACEMENT (IOC) RIGHT 4.05 00:26.7 (Right: Eye)  Patient location during evaluation: PACU Anesthesia Type: MAC Level of consciousness: awake and alert Pain management: pain level controlled Vital Signs Assessment: post-procedure vital signs reviewed and stable Respiratory status: spontaneous breathing, nonlabored ventilation and respiratory function stable Cardiovascular status: blood pressure returned to baseline and stable Postop Assessment: no apparent nausea or vomiting Anesthetic complications: no   No notable events documented.   Last Vitals:  Vitals:   12/20/22 0931 12/20/22 0934  BP: 110/79 122/75  Pulse: 67 68  Resp:    Temp:    SpO2: 98% 98%    Last Pain:  Vitals:   12/20/22 0931  TempSrc:   PainSc: 0-No pain                 Iran Ouch

## 2022-12-20 NOTE — Transfer of Care (Signed)
Immediate Anesthesia Transfer of Care Note  Patient: Alec Wilson  Procedure(s) Performed: CATARACT EXTRACTION PHACO AND INTRAOCULAR LENS PLACEMENT (IOC) RIGHT 4.05 00:26.7 (Right: Eye)  Patient Location: PACU  Anesthesia Type: MAC  Level of Consciousness: awake, alert  and patient cooperative  Airway and Oxygen Therapy: Patient Spontanous Breathing and Patient connected to supplemental oxygen  Post-op Assessment: Post-op Vital signs reviewed, Patient's Cardiovascular Status Stable, Respiratory Function Stable, Patent Airway and No signs of Nausea or vomiting  Post-op Vital Signs: Reviewed and stable  Complications: No notable events documented.

## 2022-12-21 ENCOUNTER — Encounter: Payer: Self-pay | Admitting: Ophthalmology

## 2023-01-02 ENCOUNTER — Other Ambulatory Visit: Payer: Self-pay | Admitting: Primary Care

## 2023-01-02 DIAGNOSIS — E785 Hyperlipidemia, unspecified: Secondary | ICD-10-CM

## 2023-01-02 DIAGNOSIS — I10 Essential (primary) hypertension: Secondary | ICD-10-CM

## 2023-01-05 ENCOUNTER — Encounter: Payer: Self-pay | Admitting: Primary Care

## 2023-01-05 ENCOUNTER — Ambulatory Visit (INDEPENDENT_AMBULATORY_CARE_PROVIDER_SITE_OTHER): Payer: PPO | Admitting: Primary Care

## 2023-01-05 VITALS — BP 110/66 | HR 59 | Temp 97.1°F | Ht 69.0 in | Wt 183.0 lb

## 2023-01-05 DIAGNOSIS — Z125 Encounter for screening for malignant neoplasm of prostate: Secondary | ICD-10-CM | POA: Diagnosis not present

## 2023-01-05 DIAGNOSIS — I10 Essential (primary) hypertension: Secondary | ICD-10-CM

## 2023-01-05 DIAGNOSIS — M1990 Unspecified osteoarthritis, unspecified site: Secondary | ICD-10-CM | POA: Diagnosis not present

## 2023-01-05 DIAGNOSIS — E782 Mixed hyperlipidemia: Secondary | ICD-10-CM | POA: Diagnosis not present

## 2023-01-05 DIAGNOSIS — Z0000 Encounter for general adult medical examination without abnormal findings: Secondary | ICD-10-CM

## 2023-01-05 LAB — COMPREHENSIVE METABOLIC PANEL
ALT: 17 U/L (ref 0–53)
AST: 14 U/L (ref 0–37)
Albumin: 4 g/dL (ref 3.5–5.2)
Alkaline Phosphatase: 97 U/L (ref 39–117)
BUN: 16 mg/dL (ref 6–23)
CO2: 29 mEq/L (ref 19–32)
Calcium: 9.1 mg/dL (ref 8.4–10.5)
Chloride: 103 mEq/L (ref 96–112)
Creatinine, Ser: 0.75 mg/dL (ref 0.40–1.50)
GFR: 92.12 mL/min (ref >60.00)
Glucose, Bld: 93 mg/dL (ref 70–99)
Potassium: 4.4 mEq/L (ref 3.5–5.1)
Sodium: 138 mEq/L (ref 135–145)
Total Bilirubin: 0.7 mg/dL (ref 0.2–1.2)
Total Protein: 6.4 g/dL (ref 6.0–8.3)

## 2023-01-05 LAB — LIPID PANEL
Cholesterol: 168 mg/dL (ref 0–200)
HDL: 76 mg/dL (ref >39.00)
LDL Cholesterol: 77 mg/dL (ref 0–99)
NonHDL: 92
Total CHOL/HDL Ratio: 2
Triglycerides: 74 mg/dL (ref 0.0–149.0)
VLDL: 14.8 mg/dL (ref 0.0–40.0)

## 2023-01-05 LAB — HEMOGLOBIN A1C: Hgb A1c MFr Bld: 5.5 % (ref 4.6–6.5)

## 2023-01-05 LAB — PSA, MEDICARE: PSA: 2.16 ng/ml (ref 0.10–4.00)

## 2023-01-05 NOTE — Assessment & Plan Note (Signed)
Immunizations UTD. PSA due and pending. Colonoscopy UTD, due 2030.  Discussed the importance of a healthy diet and regular exercise in order for weight loss, and to reduce the risk of further co-morbidity.  Exam stable. Labs pending.  Follow up in 1 year for repeat physical.

## 2023-01-05 NOTE — Progress Notes (Signed)
Subjective:    Patient ID: Alec Wilson, male    DOB: [birth year], 72 y.o.   MRN: [id number]  HPI  Alec Wilson is a very pleasant 72 y.o. male who presents today for complete physical and follow up of chronic conditions.  Immunizations: -Tetanus: Completed in 2008 -Influenza: Completed this season -Shingles: Completed Shingrix series -Pneumonia: Completed Prevnar 13 in 2019 and pneumovax in 2020  Diet: [place]ett.  Exercise: No regular exercise.  Eye exam: Completes annually  Dental exam: Completes semi-annually   Colonoscopy: Completed in 2023, due 2030 PSA: Due   BP Readings from Last 3 Encounters:  01/05/23 110/66  12/20/22 122/75  12/06/22 119/86        Review of Systems  Constitutional:  Negative for unexpected weight change.  HENT:  Negative for rhinorrhea.   Respiratory:  Negative for cough and shortness of breath.   Cardiovascular:  Negative for chest pain.  Gastrointestinal:  Negative for constipation and diarrhea.  Genitourinary:  Negative for difficulty urinating.  Musculoskeletal:  Positive for arthralgias and myalgias.  Skin:  Negative for rash.  Allergic/Immunologic: Negative for environmental allergies.  Neurological:  Negative for dizziness and headaches.  Psychiatric/Behavioral:  The patient is not nervous/anxious.          Past Medical History:  Diagnosis Date   Acute viral sinusitis 12/14/2018   Adenomatous colon polyp    last colonoscopy was 3 years ago.   Chest pressure 10/07/2019   Diverticulitis    Diverticulosis    Hemorrhoids summer 2005   s/p ligation of internal hemorrhoids   Hernia, inguinal, left 10/01/2018   Hernia, inguinal-right 01/22/2013   History of bilateral inguinal hernia repair 04/11/2022   HTN (hypertension)    Hyperlipidemia    Inguinal hernia    Internal hemorrhoids with bleeding and prolapse 02/15/2013   Irregular heart rhythm    Stress ECHO-Negative (09/22/98)   PAF (paroxysmal atrial fibrillation)  ([place]) 1999   Stress ECHO-Negative (09/22/98) -> no further documented evidence of A. fib   RMSF [facility] spotted fever) 05/2004   Sore throat 09/08/2021   Tubular adenoma of colon     Social History   Socioeconomic History   Marital status: Married    Spouse name: Not on file   Number of children: 2   Years of education: Not on file   Highest education level: Not on file  Occupational History   Occupation: retired  Tobacco Use   Smoking status: Former    Packs/day: 1.00    Types: Cigarettes   Smokeless tobacco: Former    Quit date: 02/19/1998  Vaping Use   Vaping Use: Never used  Substance and Sexual Activity   Alcohol use: Yes    Alcohol/week: 2.0 standard drinks of alcohol    Types: 2 Glasses of wine per week    Comment: a glass of wine weekly   Drug use: No   Sexual activity: Yes    Birth control/protection: Surgical  Other Topics Concern   Not on file  Social History Narrative   From [place].   Works at [employer] in [employer].   Married.  2 children: 39, 27.    Daughters live in [place] and [place].   Enjoys golfing, visiting vineyards, traveling --> exercises 6 days a week 1 walking and playing golf      Quit smoking in 1999.   Drinks roughly 5 glasses of wine a week.   Social Determinants of Health  Financial Resource Strain: Low Risk  (12/06/2021)   Overall Financial Resource Strain (CARDIA)    Difficulty of Paying Living Expenses: Not hard at all  Food Insecurity: No Food Insecurity (12/06/2021)   Hunger Vital Sign    Worried About Running Out of Food in the Last Year: Never true    Ran Out of Food in the Last Year: Never true  Transportation Needs: No Transportation Needs (12/06/2021)   PRAPARE - Hydrologist (Medical): No    Lack of Transportation (Non-Medical): No  Physical Activity: Sufficiently Active (12/06/2021)   Exercise Vital Sign    Days of Exercise per Week: 7 days    Minutes of Exercise per  Session: 60 min  Stress: No Stress Concern Present (12/06/2021)   [place]    Feeling of Stress : Not at all  Social Connections: Unknown (12/03/2022)   Social Connection and Isolation Panel [NHANES]    Frequency of Communication with Friends and Family: Not on file    Frequency of Social Gatherings with Friends and Family: Not on file    Attends Religious Services: More than 4 times per year    Active Member of [employer] or Organizations: Not on file    Attends Archivist Meetings: Not on file    Marital Status: Not on file  Intimate Partner Violence: Not At Risk (12/07/2022)   Humiliation, Afraid, Rape, and Kick questionnaire    Fear of Current or Ex-Partner: No    Emotionally Abused: No    Physically Abused: No    Sexually Abused: No    Past Surgical History:  Procedure Laterality Date   CARDIAC EVENT MONITOR  10/2019   Baseline sample showed sinus rhythm with average rate 79 bpm.  Minimum heart rate 48 bpm.  Maximum heart rate sinus tachycardia 159 bpm. <1% PVCs.  No A. fib.  Bradycardia was noted during sleeping hours.  Patient had one episode of sensation of fluttering or skipped beats during which she had a short run of sinus rhythm with PVCs in bigeminy.   CATARACT EXTRACTION W/PHACO Left 12/06/2022   Procedure: CATARACT EXTRACTION PHACO AND INTRAOCULAR LENS PLACEMENT (IOC) LEFT  4.92  00:43.0;  Surgeon: Birder Robson, MD;  Location: [place];  Service: Ophthalmology;  Laterality: Left;   CATARACT EXTRACTION W/PHACO Right 12/20/2022   Procedure: CATARACT EXTRACTION PHACO AND INTRAOCULAR LENS PLACEMENT (IOC) RIGHT 4.05 00:26.7;  Surgeon: Birder Robson, MD;  Location: [place];  Service: Ophthalmology;  Laterality: Right;   COLONOSCOPY  2013   HEMORRHOID SURGERY  2005   INGUINAL HERNIA REPAIR Right 2019   NM MYOVIEW LTD  11/2019   EF 60%..  Medium size, mild severity fixed apical  defect with normal wall motion consistent with artifact.  LOW Risk   THROAT SURGERY  childhood   TRANSTHORACIC ECHOCARDIOGRAM  12/04/2019    EF 60 to 65%.  No R WMA.  Mild basal septal hypertrophy.  Normal RV function but mildly increased size.  Normal atrial sizes.  Mild aortic sclerosis, no stenosis.   VASECTOMY      Family History  Problem Relation Age of Onset   Coronary artery disease Father 31       CABG at age 71, MI at 17.   Parkinson's disease Father    Colon polyps Father    Congestive Heart Failure Father    Hypertension Brother    Hypertension Mother    Arthritis  Mother    Heart attack Mother 54   Cancer Maternal Grandmother    Cancer Maternal Grandfather    Heart attack Paternal Grandfather 10   Rectal cancer Neg Hx    Stomach cancer Neg Hx    Colon cancer Neg Hx    Esophageal cancer Neg Hx     No Known Allergies  Current Outpatient Medications on File Prior to Visit  Medication Sig Dispense Refill   aspirin 81 MG tablet Take 81 mg by mouth daily.     fish oil-omega-3 fatty acids 1000 MG capsule Take 1 g by mouth daily.     losartan (COZAAR) 25 MG tablet TAKE 1 TABLET BY MOUTH EVERY DAY FOR BLOOD PRESSURE 90 tablet 0   Multiple Vitamins-Minerals (MULTIVITAMIN PO) Take 1 Centrum Silver multivitamin by mouth daily     simvastatin (ZOCOR) 20 MG tablet TAKE 1 TABLET DAILY IN EVENING FOR CHOLESTEROL 90 tablet 0   No current facility-administered medications on file prior to visit.    BP 110/66   Pulse (!) 59   Temp (!) 97.1 F (36.2 C) (Temporal)   Ht 5' 9"$  (1.753 m)   Wt 183 lb (83 kg)   SpO2 99%   BMI 27.02 kg/m  Objective:   Physical Exam HENT:     Right Ear: Tympanic membrane and ear canal normal.     Left Ear: Tympanic membrane and ear canal normal.     Nose: Nose normal.     Right Sinus: No maxillary sinus tenderness or frontal sinus tenderness.     Left Sinus: No maxillary sinus tenderness or frontal sinus tenderness.  Eyes:      Conjunctiva/sclera: Conjunctivae normal.  Neck:     Thyroid: No thyromegaly.     Vascular: No carotid bruit.  Cardiovascular:     Rate and Rhythm: Normal rate and regular rhythm.     Heart sounds: Normal heart sounds.  Pulmonary:     Effort: Pulmonary effort is normal.     Breath sounds: Normal breath sounds. No wheezing or rales.  Abdominal:     General: Bowel sounds are normal.     Palpations: Abdomen is soft.     Tenderness: There is no abdominal tenderness.  Musculoskeletal:        General: Normal range of motion.     Cervical back: Neck supple.  Skin:    General: Skin is warm and dry.  Neurological:     Mental Status: He is alert and oriented to person, place, and time.     Cranial Nerves: No cranial nerve deficit.     Deep Tendon Reflexes: Reflexes are normal and symmetric.  Psychiatric:        Mood and Affect: Mood normal.           Assessment & Plan:  Preventative health care Assessment & Plan: Immunizations UTD. PSA due and pending. Colonoscopy UTD, due 2030.  Discussed the importance of a healthy diet and regular exercise in order for weight loss, and to reduce the risk of further co-morbidity.  Exam stable. Labs pending.  Follow up in 1 year for repeat physical.    Essential hypertension Assessment & Plan: Controlled.   Continue losartan 25 mg daily. CMP pending.  Orders: -     Hemoglobin A1c  Arthritis Assessment & Plan: Chronic, stable.  Continue Tylenol and Ibuprofen PRN.   Mixed hyperlipidemia Assessment & Plan: Repeat lipid panel pending. Continue simvastatin 20 mg daily.  Consider holding statin x 2 weeks  for joint aches/myalgias. He will update if he does.   Orders: -     Lipid panel -     Comprehensive metabolic panel -     Hemoglobin A1c  Screening for prostate cancer -     PSA, Medicare        Pleas Koch, NP

## 2023-01-05 NOTE — Patient Instructions (Signed)
Consider holding your simvastatin for 2 weeks to see if this helps with your joint aches/cramping.  Stop by the lab prior to leaving today. I will notify you of your results once received.   It was a pleasure to see you today!

## 2023-01-05 NOTE — Assessment & Plan Note (Signed)
Chronic, stable.  Continue Tylenol and Ibuprofen PRN.

## 2023-01-05 NOTE — Assessment & Plan Note (Signed)
Repeat lipid panel pending. Continue simvastatin 20 mg daily.  Consider holding statin x 2 weeks for joint aches/myalgias. He will update if he does.

## 2023-01-05 NOTE — Assessment & Plan Note (Signed)
Controlled. ? ?Continue losartan 25 mg daily. ? ?CMP pending. ?

## 2023-01-12 DIAGNOSIS — Z961 Presence of intraocular lens: Secondary | ICD-10-CM | POA: Diagnosis not present

## 2023-03-31 ENCOUNTER — Other Ambulatory Visit: Payer: Self-pay | Admitting: Primary Care

## 2023-03-31 DIAGNOSIS — I10 Essential (primary) hypertension: Secondary | ICD-10-CM

## 2023-03-31 DIAGNOSIS — E785 Hyperlipidemia, unspecified: Secondary | ICD-10-CM

## 2023-07-13 DIAGNOSIS — H26492 Other secondary cataract, left eye: Secondary | ICD-10-CM | POA: Diagnosis not present

## 2023-11-28 ENCOUNTER — Ambulatory Visit: Payer: PPO

## 2023-11-28 VITALS — BP 118/66 | Ht 69.0 in | Wt 184.4 lb

## 2023-11-28 DIAGNOSIS — Z0000 Encounter for general adult medical examination without abnormal findings: Secondary | ICD-10-CM | POA: Diagnosis not present

## 2023-11-28 NOTE — Patient Instructions (Signed)
 Mr. Handshoe , Thank you for taking time to come for your Medicare Wellness Visit. I appreciate your ongoing commitment to your health goals. Please review the following plan we discussed and let me know if I can assist you in the future.   Referrals/Orders/Follow-Ups/Clinician Recommendations: none  This is a list of the screening recommended for you and due dates:  Health Maintenance  Topic Date Due   COVID-19 Vaccine (8 - 2024-25 season) 09/29/2023   Medicare Annual Wellness Visit  11/27/2024   Colon Cancer Screening  06/21/2029   Pneumonia Vaccine  Completed   Flu Shot  Completed   Hepatitis C Screening  Completed   Zoster (Shingles) Vaccine  Completed   HPV Vaccine  Aged Out   DTaP/Tdap/Td vaccine  Discontinued    Advanced directives: (Copy Requested) Please bring a copy of your health care power of attorney and living will to the office to be added to your chart at your convenience.  Next Medicare Annual Wellness Visit scheduled for next year: Yes 11/29/2023 @ 10:10AM in person

## 2023-11-28 NOTE — Progress Notes (Signed)
 Subjective:   Alec Wilson is a 73 y.o. male who presents for Medicare Annual/Subsequent preventive examination.  Visit Complete: In person  Patient Medicare AWV questionnaire was completed by the patient on 11/24/2023; I have confirmed that all information answered by patient is correct and no changes since this date.  Cardiac Risk Factors include: advanced age (>74men, >80 women);dyslipidemia;hypertension;male gender    Objective:    Today's Vitals   11/28/23 1005  Weight: 184 lb 6.4 oz (83.6 kg)  Height: 5' 9 (1.753 m)  PainSc: 0-No pain   Body mass index is 27.23 kg/m.     11/28/2023   10:15 AM 12/20/2022    8:28 AM 12/07/2022   10:00 AM 12/06/2022   10:35 AM 12/06/2021    8:17 AM 12/04/2020    8:13 AM 10/29/2019   10:02 AM  Advanced Directives  Does Patient Have a Medical Advance Directive? Yes No No No Yes Yes Yes  Type of Estate Agent of [place];Living will    Healthcare Power of [place];Living will Healthcare Power of [place];Living will Healthcare Power of [place];Living will  Does patient want to make changes to medical advance directive?     Yes (MAU/Ambulatory/Procedural Areas - Information given)    Copy of Healthcare Power of Attorney in Chart? No - copy requested     No - copy requested No - copy requested  Would patient like information on creating a medical advance directive?  No - Patient declined No - Patient declined Yes (MAU/Ambulatory/Procedural Areas - Information given)       Current Medications (verified) Outpatient Encounter Medications as of 11/28/2023  Medication Sig   aspirin 81 MG tablet Take 81 mg by mouth daily.   fish oil-omega-3 fatty acids 1000 MG capsule Take 1 g by mouth daily.   losartan  (COZAAR ) 25 MG tablet TAKE 1 TABLET BY MOUTH EVERY DAY FOR BLOOD PRESSURE   Multiple Vitamins-Minerals (MULTIVITAMIN PO) Take 1 Centrum Silver multivitamin by mouth daily   simvastatin  (ZOCOR ) 20 MG tablet TAKE 1 TABLET DAILY IN  EVENING FOR CHOLESTEROL   No facility-administered encounter medications on file as of 11/28/2023.    Allergies (verified) Patient has no known allergies.   History: Past Medical History:  Diagnosis Date   Acute viral sinusitis 12/14/2018   Adenomatous colon polyp    last colonoscopy was 3 years ago.   Chest pressure 10/07/2019   Diverticulitis    Diverticulosis    Hemorrhoids summer 2005   s/p ligation of internal hemorrhoids   Hernia, inguinal, left 10/01/2018   Hernia, inguinal-right 01/22/2013   History of bilateral inguinal hernia repair 04/11/2022   HTN (hypertension)    Hyperlipidemia    Inguinal hernia    Internal hemorrhoids with bleeding and prolapse 02/15/2013   Irregular heart rhythm    Stress ECHO-Negative (09/22/98)   PAF (paroxysmal atrial fibrillation) (HCC) 1999   Stress ECHO-Negative (09/22/98) -> no further documented evidence of A. fib   RMSF [facility] spotted fever) 05/2004   Sore throat 09/08/2021   Tubular adenoma of colon    Past Surgical History:  Procedure Laterality Date   CARDIAC EVENT MONITOR  10/2019   Baseline sample showed sinus rhythm with average rate 79 bpm.  Minimum heart rate 48 bpm.  Maximum heart rate sinus tachycardia 159 bpm. <1% PVCs.  No A. fib.  Bradycardia was noted during sleeping hours.  Patient had one episode of sensation of fluttering or skipped beats during which she had a short  run of sinus rhythm with PVCs in bigeminy.   CATARACT EXTRACTION W/PHACO Left 12/06/2022   Procedure: CATARACT EXTRACTION PHACO AND INTRAOCULAR LENS PLACEMENT (IOC) LEFT  4.92  00:43.0;  Surgeon: Jaye Fallow, MD;  Location: [facility];  Service: Ophthalmology;  Laterality: Left;   CATARACT EXTRACTION W/PHACO Right 12/20/2022   Procedure: CATARACT EXTRACTION PHACO AND INTRAOCULAR LENS PLACEMENT (IOC) RIGHT 4.05 00:26.7;  Surgeon: Jaye Fallow, MD;  Location: [facility];  Service: Ophthalmology;  Laterality: Right;    COLONOSCOPY  2013   HEMORRHOID SURGERY  2005   INGUINAL HERNIA REPAIR Right 2019   NM MYOVIEW  LTD  11/2019   EF 60%..  Medium size, mild severity fixed apical defect with normal wall motion consistent with artifact.  LOW Risk   THROAT SURGERY  childhood   TRANSTHORACIC ECHOCARDIOGRAM  12/04/2019    EF 60 to 65%.  No R WMA.  Mild basal septal hypertrophy.  Normal RV function but mildly increased size.  Normal atrial sizes.  Mild aortic sclerosis, no stenosis.   VASECTOMY     Family History  Problem Relation Age of Onset   Coronary artery disease Father 15       CABG at age 53, MI at 55.   Parkinson's disease Father    Colon polyps Father    Congestive Heart Failure Father    Hypertension Brother    Hypertension Mother    Arthritis Mother    Heart attack Mother 53   Cancer Maternal Grandmother    Cancer Maternal Grandfather    Heart attack Paternal Grandfather 64   Rectal cancer Neg Hx    Stomach cancer Neg Hx    Colon cancer Neg Hx    Esophageal cancer Neg Hx    Social History   Socioeconomic History   Marital status: Married    Spouse name: Not on file   Number of children: 2   Years of education: Not on file   Highest education level: Not on file  Occupational History   Occupation: retired  Tobacco Use   Smoking status: Former    Current packs/day: 1.00    Types: Cigarettes   Smokeless tobacco: Former    Quit date: 02/19/1998  Vaping Use   Vaping status: Never Used  Substance and Sexual Activity   Alcohol use: Yes    Alcohol/week: 2.0 standard drinks of alcohol    Types: 2 Glasses of wine per week    Comment: a glass of wine weekly   Drug use: No   Sexual activity: Yes    Birth control/protection: Surgical  Other Topics Concern   Not on file  Social History Narrative   From [place] .   Works at [employer] in [place].   Married.  2 children: 39, 27.    Daughters live in [place]  and [place].   Enjoys golfing, visiting vineyards, traveling -->  exercises 6 days a week 1 walking and playing golf      Quit smoking in 1999.   Drinks roughly 5 glasses of wine a week.   Social Drivers of Corporate Investment Banker Strain: Low Risk  (11/28/2023)   Overall Financial Resource Strain (CARDIA)    Difficulty of Paying Living Expenses: Not hard at all  Food Insecurity: No Food Insecurity (11/28/2023)   Hunger Vital Sign    Worried About Running Out of Food in the Last Year: Never true    Ran Out of Food in the Last Year: Never  true  Transportation Needs: No Transportation Needs (11/28/2023)   PRAPARE - Administrator, Civil Service (Medical): No    Lack of Transportation (Non-Medical): No  Physical Activity: Sufficiently Active (11/28/2023)   Exercise Vital Sign    Days of Exercise per Week: 7 days    Minutes of Exercise per Session: 30 min  Stress: No Stress Concern Present (11/28/2023)   [employer] of Occupational Health - Occupational Stress Questionnaire    Feeling of Stress : Not at all  Social Connections: Moderately Integrated (11/28/2023)   Social Connection and Isolation Panel [NHANES]    Frequency of Communication with Friends and Family: More than three times a week    Frequency of Social Gatherings with Friends and Family: More than three times a week    Attends Religious Services: More than 4 times per year    Active Member of [employer] or Organizations: No    Attends Engineer, Structural: Never    Marital Status: Married    Tobacco Counseling Counseling given: Not Answered   Clinical Intake:  Pre-visit preparation completed: Yes  Pain : No/denies pain Pain Score: 0-No pain   BMI - recorded: 27.23 Nutritional Status: BMI 25 -29 Overweight Nutritional Risks: None Diabetes: No  How often do you need to have someone help you when you read instructions, pamphlets, or other written materials from your doctor or pharmacy?: 1 - Never  Interpreter Needed?: No  Comments: lives with  wife Information entered by :: B.Evan Osburn,LPN   Activities of Daily Living    11/24/2023   11:57 AM 01/05/2023    8:32 AM  In your present state of health, do you have any difficulty performing the following activities:  Hearing? 0 0  Vision? 0 0  Difficulty concentrating or making decisions? 0 0  Walking or climbing stairs? 0 0  Dressing or bathing? 0 0  Doing errands, shopping? 0 0  Preparing Food and eating ? N   Using the Toilet? N   In the past six months, have you accidently leaked urine? N   Do you have problems with loss of bowel control? N   Managing your Medications? N   Managing your Finances? N   Housekeeping or managing your Housekeeping? N     Patient Care Team: Gretta Comer POUR, NP as PCP - General (Nurse Practitioner) Avram Lupita BRAVO, MD (Gastroenterology) Tisa Bars, MD (Inactive) (Cardiology) Jaye Fallow, MD as Referring Physician (Ophthalmology)  Indicate any recent Medical Services you may have received from other than [facility] providers in the past year (date may be approximate).     Assessment:   This is a routine wellness examination for [place].  Hearing/Vision screen Hearing Screening - Comments:: Pt says his hearing is good Vision Screening - Comments:: Pt says his vision is good with glasses (mainly for reading) Dr Jaye    Goals Addressed             This Visit's Progress    COMPLETED: Patient Stated   On track    10/29/2019, I will maintain and continue medications as prescribed.      Patient Stated   Not on track    Would like to lose 5lbs     Patient Stated   Not on track    Lose 5 lbs        Depression Screen    11/28/2023   10:12 AM 01/05/2023    8:32 AM 12/07/2022    9:59 AM 12/07/2021  8:37 AM 12/06/2021    8:20 AM 12/04/2020    8:15 AM 11/10/2020   10:28 AM  PHQ 2/9 Scores  PHQ - 2 Score 0 0 0 0 0 0 0  PHQ- 9 Score    0  0 0    Fall Risk    11/24/2023   11:57 AM 01/05/2023    8:32 AM 12/03/2022     9:32 AM 12/07/2021    8:39 AM 12/06/2021    8:19 AM  Fall Risk   Falls in the past year? 0 0 0 0 0  Number falls in past yr: 0 0 0 0 0  Injury with Fall? 0 0 0 0 0  Risk for fall due to : No Fall Risks No Fall Risks Impaired vision    Follow up Education provided;Falls prevention discussed Falls evaluation completed Falls prevention discussed  Falls prevention discussed    MEDICARE RISK AT HOME: Medicare Risk at Home Any stairs in or around the home?: (Patient-Rptd) Yes If so, are there any without handrails?: (Patient-Rptd) No Home free of loose throw rugs in walkways, pet beds, electrical cords, etc?: (Patient-Rptd) No Adequate lighting in your home to reduce risk of falls?: (Patient-Rptd) Yes Life alert?: (Patient-Rptd) No Use of a cane, walker or w/c?: (Patient-Rptd) No Grab bars in the bathroom?: (Patient-Rptd) Yes Shower chair or bench in shower?: (Patient-Rptd) No Elevated toilet seat or a handicapped toilet?: (Patient-Rptd) Yes  TIMED UP AND GO:  Was the test performed?  Yes  Length of time to ambulate 10 feet: 10 sec Gait steady and fast without use of assistive device    Cognitive Function:    12/04/2020    8:17 AM 10/29/2019   10:05 AM  MMSE - Mini Mental State Exam  Orientation to time 5 5  Orientation to Place 5 5  Registration 3 3  Attention/ Calculation 5 5  Recall 3 3  Language- repeat 1 1        11/28/2023   10:16 AM 12/07/2022   10:02 AM  6CIT Screen  What Year? 0 points 0 points  What month? 0 points 0 points  What time? 0 points 0 points  Count back from 20 0 points 0 points  Months in reverse 0 points 0 points  Repeat phrase 0 points 0 points  Total Score 0 points 0 points    Immunizations Immunization History  Administered Date(s) Administered   Fluad Quad(high Dose 65+) 09/08/2020, 08/24/2021   Influenza Split 12/01/2011   Influenza,inj,Quad PF,6+ Mos 10/07/2016, 08/25/2017, 08/23/2018, 08/29/2019   Influenza-Unspecified 08/30/2014,  08/21/2015, 08/23/2021, 08/29/2022   PFIZER Comirnaty(Gray Top)Covid-19 Tri-Sucrose Vaccine 05/25/2021   PFIZER(Purple Top)SARS-COV-2 Vaccination 12/27/2019, 01/21/2020, 09/21/2020   Pfizer Covid-19 Vaccine Bivalent Booster 36yrs & up 09/21/2021   Pfizer(Comirnaty)Fall Seasonal Vaccine 12 years and older 08/29/2022, 08/04/2023   Pneumococcal Conjugate-13 10/01/2018   Pneumococcal Polysaccharide-23 11/06/2019   Tdap 10/16/2007   Zoster Recombinant(Shingrix) 05/13/2020, 07/14/2020    TDAP status: Up to date  Flu Vaccine status: Up to date  Pneumococcal vaccine status: Up to date  Covid-19 vaccine status: Completed vaccines  Qualifies for Shingles Vaccine? Yes   Zostavax completed Yes   Shingrix Completed?: Yes  Screening Tests Health Maintenance  Topic Date Due   COVID-19 Vaccine (8 - 2024-25 season) 09/29/2023   Medicare Annual Wellness (AWV)  11/27/2024   Colonoscopy  06/21/2029   Pneumonia Vaccine 46+ Years old  Completed   INFLUENZA VACCINE  Completed   Hepatitis C Screening  Completed  Zoster Vaccines- Shingrix  Completed   HPV VACCINES  Aged Out   DTaP/Tdap/Td  Discontinued    Health Maintenance  Health Maintenance Due  Topic Date Due   COVID-19 Vaccine (8 - 2024-25 season) 09/29/2023    Colorectal cancer screening: Type of screening: Colonoscopy. Completed 06/21/2022. Repeat every 7 years  Lung Cancer Screening: (Low Dose CT Chest recommended if Age 61-80 years, 20 pack-year currently smoking OR have quit w/in 15years.) does not qualify.   Lung Cancer Screening Referral: no  Additional Screening:  Hepatitis C Screening: does not qualify; Completed 11/10/2020  Vision Screening: Recommended annual ophthalmology exams for early detection of glaucoma and other disorders of the eye. Is the patient up to date with their annual eye exam?  Yes  Who is the provider or what is the name of the office in which the patient attends annual eye exams? Dr Jaye If pt is  not established with a provider, would they like to be referred to a provider to establish care? No .   Dental Screening: Recommended annual dental exams for proper oral hygiene  Diabetic Foot Exam: n/a  Community Resource Referral / Chronic Care Management: CRR required this visit?  No   CCM required this visit?  No   Plan:     I have personally reviewed and noted the following in the patient's chart:   Medical and social history Use of alcohol, tobacco or illicit drugs  Current medications and supplements including opioid prescriptions. Patient is not currently taking opioid prescriptions. Functional ability and status Nutritional status Physical activity Advanced directives List of other physicians Hospitalizations, surgeries, and ER visits in previous 12 months Vitals Screenings to include cognitive, depression, and falls Referrals and appointments  In addition, I have reviewed and discussed with patient certain preventive protocols, quality metrics, and best practice recommendations. A written personalized care plan for preventive services as well as general preventive health recommendations were provided to patient.    Erminio LITTIE Saris, LPN   06/22/7973   After Visit Summary: (MyChart) Due to this being a telephonic visit, the after visit summary with patients personalized plan was offered to patient via MyChart   Nurse Notes: The patient states he is doing well and has no concerns or questions at this time.

## 2023-12-10 ENCOUNTER — Other Ambulatory Visit: Payer: Self-pay | Admitting: Primary Care

## 2023-12-10 DIAGNOSIS — I10 Essential (primary) hypertension: Secondary | ICD-10-CM

## 2023-12-10 DIAGNOSIS — E785 Hyperlipidemia, unspecified: Secondary | ICD-10-CM

## 2024-01-09 ENCOUNTER — Encounter: Payer: Self-pay | Admitting: Primary Care

## 2024-01-09 ENCOUNTER — Ambulatory Visit (INDEPENDENT_AMBULATORY_CARE_PROVIDER_SITE_OTHER): Payer: PPO | Admitting: Primary Care

## 2024-01-09 VITALS — BP 138/86 | HR 63 | Temp 97.0°F | Ht 69.0 in | Wt 185.0 lb

## 2024-01-09 DIAGNOSIS — Z125 Encounter for screening for malignant neoplasm of prostate: Secondary | ICD-10-CM | POA: Diagnosis not present

## 2024-01-09 DIAGNOSIS — E782 Mixed hyperlipidemia: Secondary | ICD-10-CM | POA: Diagnosis not present

## 2024-01-09 DIAGNOSIS — I10 Essential (primary) hypertension: Secondary | ICD-10-CM | POA: Diagnosis not present

## 2024-01-09 DIAGNOSIS — Z0000 Encounter for general adult medical examination without abnormal findings: Secondary | ICD-10-CM

## 2024-01-09 DIAGNOSIS — L989 Disorder of the skin and subcutaneous tissue, unspecified: Secondary | ICD-10-CM

## 2024-01-09 LAB — COMPREHENSIVE METABOLIC PANEL
ALT: 19 U/L (ref 0–53)
AST: 18 U/L (ref 0–37)
Albumin: 4.2 g/dL (ref 3.5–5.2)
Alkaline Phosphatase: 91 U/L (ref 39–117)
BUN: 12 mg/dL (ref 6–23)
CO2: 29 meq/L (ref 19–32)
Calcium: 9 mg/dL (ref 8.4–10.5)
Chloride: 99 meq/L (ref 96–112)
Creatinine, Ser: 0.74 mg/dL (ref 0.40–1.50)
GFR: 91.84 mL/min (ref >60.00)
Glucose, Bld: 87 mg/dL (ref 70–99)
Potassium: 4.5 meq/L (ref 3.5–5.1)
Sodium: 135 meq/L (ref 135–145)
Total Bilirubin: 0.9 mg/dL (ref 0.2–1.2)
Total Protein: 6.6 g/dL (ref 6.0–8.3)

## 2024-01-09 LAB — LIPID PANEL
Cholesterol: 170 mg/dL (ref 0–200)
HDL: 83.2 mg/dL (ref >39.00)
LDL Cholesterol: 69 mg/dL (ref 0–99)
NonHDL: 87.01
Total CHOL/HDL Ratio: 2
Triglycerides: 88 mg/dL (ref 0.0–149.0)
VLDL: 17.6 mg/dL (ref 0.0–40.0)

## 2024-01-09 LAB — PSA, MEDICARE: PSA: 2.42 ng/mL (ref 0.10–4.00)

## 2024-01-09 NOTE — Assessment & Plan Note (Signed)
Noted on exam today. Follow up with dermatology as scheduled.

## 2024-01-09 NOTE — Assessment & Plan Note (Signed)
Repeat lipid panel pending. Continue simvastatin 20 mg daily.  

## 2024-01-09 NOTE — Patient Instructions (Signed)
 Stop by the lab prior to leaving today. I will notify you of your results once received.   It was a pleasure to see you today!

## 2024-01-09 NOTE — Progress Notes (Signed)
Subjective:    Patient ID: Alec Wilson, male    DOB: Nov 26, 1950, 73 y.o.   MRN: [id number]  HPI  Alec Wilson is a very pleasant 73 y.o. male who presents today for complete physical and follow up of chronic conditions.  Immunizations: -Tetanus: Completed in 2008 -Influenza: Completed last season  -Shingles: Completed Shingrix series -Pneumonia: Completed Prevnar 13 in 2019, pneumovax in 2020  Diet: Fair diet.  Exercise: No regular exercise.  Eye exam: Completes annually  Dental exam: Completes semi-annually    Colonoscopy: Completed in 2023., due 2030  PSA: Due  BP Readings from Last 3 Encounters:  01/09/24 138/86  11/28/23 118/66  01/05/23 110/66      Review of Systems  Constitutional:  Negative for unexpected weight change.  HENT:  Negative for rhinorrhea.   Respiratory:  Negative for cough and shortness of breath.   Cardiovascular:  Negative for chest pain.  Gastrointestinal:  Negative for constipation and diarrhea.  Genitourinary:  Negative for difficulty urinating.  Musculoskeletal:  Negative for arthralgias and myalgias.  Skin:  Negative for rash.  Allergic/Immunologic: Negative for environmental allergies.  Neurological:  Negative for dizziness, numbness and headaches.  Psychiatric/Behavioral:  The patient is not nervous/anxious.          Past Medical History:  Diagnosis Date   Acute viral sinusitis 12/14/2018   Adenomatous colon polyp    last colonoscopy was 3 years ago.   Chest pressure 10/07/2019   Diverticulitis    Diverticulosis    Hemorrhoids summer 2005   s/p ligation of internal hemorrhoids   Hernia, inguinal, left 10/01/2018   Hernia, inguinal-right 01/22/2013   History of bilateral inguinal hernia repair 04/11/2022   HTN (hypertension)    Hyperlipidemia    Inguinal hernia    Internal hemorrhoids with bleeding and prolapse 02/15/2013   Irregular heart rhythm    Stress ECHO-Negative (09/22/98)   PAF (paroxysmal atrial  fibrillation) (HCC) 1999   Stress ECHO-Negative (09/22/98) -> no further documented evidence of A. 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/2004   Sore throat 09/08/2021   Tubular adenoma of colon     Social History   Socioeconomic History   Marital status: Married    Spouse name: Not on file   Number of children: 2   Years of education: Not on file   Highest education level: Bachelor's degree (e.g., BA, AB, BS)  Occupational History   Occupation: retired  Tobacco Use   Smoking status: Former    Current packs/day: 1.00    Types: Cigarettes   Smokeless tobacco: Former    Quit date: 02/19/1998  Vaping Use   Vaping status: Never Used  Substance and Sexual Activity   Alcohol use: Yes    Alcohol/week: 2.0 standard drinks of alcohol    Types: 2 Glasses of wine per week    Comment: a glass of wine weekly   Drug use: No   Sexual activity: Yes    Birth control/protection: Surgical  Other Topics Concern   Not on file  Social History Narrative   From [place].   Works at [employer] in [employer].   Married.  2 children: 39, 27.    Daughters live in [place] and [place].   Enjoys golfing, visiting vineyards, traveling --> exercises 6 days a week 1 walking and playing golf      Quit smoking in 1999.   Drinks roughly 5  glasses of wine a week.   Social Drivers of Corporate investment banker Strain: Low Risk  (01/05/2024)   Overall Financial Resource Strain (CARDIA)    Difficulty of Paying Living Expenses: Not hard at all  Food Insecurity: No Food Insecurity (01/05/2024)   Hunger Vital Sign    Worried About Running Out of Food in the Last Year: Never true    Ran Out of Food in the Last Year: Never true  Transportation Needs: No Transportation Needs (01/05/2024)   PRAPARE - Administrator, Civil Service (Medical): No    Lack of Transportation (Non-Medical): No  Physical Activity: Sufficiently Active  (01/05/2024)   Exercise Vital Sign    Days of Exercise per Week: 5 days    Minutes of Exercise per Session: 50 min  Stress: No Stress Concern Present (01/05/2024)   [employer] of Occupational Health - Occupational Stress Questionnaire    Feeling of Stress : Only a little  Social Connections: Socially Integrated (01/05/2024)   Social Connection and Isolation Panel [NHANES]    Frequency of Communication with Friends and Family: Three times a week    Frequency of Social Gatherings with Friends and Family: Once a week    Attends Religious Services: More than 4 times per year    Active Member of [employer] or Organizations: Yes    Attends Engineer, structural: More than 4 times per year    Marital Status: Married  Catering manager Violence: Not At Risk (11/28/2023)   Humiliation, Afraid, Rape, and Kick questionnaire    Fear of Current or Ex-Partner: No    Emotionally Abused: No    Physically Abused: No    Sexually Abused: No    Past Surgical History:  Procedure Laterality Date   CARDIAC EVENT MONITOR  10/2019   Baseline sample showed sinus rhythm with average rate 79 bpm.  Minimum heart rate 48 bpm.  Maximum heart rate sinus tachycardia 159 bpm. <1% PVCs.  No A. fib.  Bradycardia was noted during sleeping hours.  Patient had one episode of sensation of fluttering or skipped beats during which she had a short run of sinus rhythm with PVCs in bigeminy.   CATARACT EXTRACTION W/PHACO Left 12/06/2022   Procedure: CATARACT EXTRACTION PHACO AND INTRAOCULAR LENS PLACEMENT (IOC) LEFT  4.92  00:43.0;  Surgeon: Galen Manila, MD;  Location: [facility];  Service: Ophthalmology;  Laterality: Left;   CATARACT EXTRACTION W/PHACO Right 12/20/2022   Procedure: CATARACT EXTRACTION PHACO AND INTRAOCULAR LENS PLACEMENT (IOC) RIGHT 4.05 00:26.7;  Surgeon: Galen Manila, MD;  Location: [facility];  Service: Ophthalmology;  Laterality: Right;   COLONOSCOPY  2013   HEMORRHOID SURGERY   2005   INGUINAL HERNIA REPAIR Right 2019   NM MYOVIEW LTD  11/2019   EF 60%..  Medium size, mild severity fixed apical defect with normal wall motion consistent with artifact.  LOW Risk   THROAT SURGERY  childhood   TRANSTHORACIC ECHOCARDIOGRAM  12/04/2019    EF 60 to 65%.  No R WMA.  Mild basal septal hypertrophy.  Normal RV function but mildly increased size.  Normal atrial sizes.  Mild aortic sclerosis, no stenosis.   VASECTOMY      Family History  Problem Relation Age of Onset   Coronary artery disease Father 80       CABG at age 75, MI at 63.   Parkinson's disease Father    Colon polyps Father    Congestive Heart Failure Father  Hypertension Brother    Hypertension Mother    Arthritis Mother    Heart attack Mother 31   Cancer Maternal Grandmother    Cancer Maternal Grandfather    Heart attack Paternal Grandfather 5   Rectal cancer Neg Hx    Stomach cancer Neg Hx    Colon cancer Neg Hx    Esophageal cancer Neg Hx     No Known Allergies  Current Outpatient Medications on File Prior to Visit  Medication Sig Dispense Refill   aspirin 81 MG tablet Take 81 mg by mouth daily.     fish oil-omega-3 fatty acids 1000 MG capsule Take 1 g by mouth daily.     losartan (COZAAR) 25 MG tablet TAKE 1 TABLET BY MOUTH EVERY DAY FOR BLOOD PRESSURE 90 tablet 0   Multiple Vitamins-Minerals (MULTIVITAMIN PO) Take 1 Centrum Silver multivitamin by mouth daily     simvastatin (ZOCOR) 20 MG tablet TAKE 1 TABLET DAILY IN EVENING FOR CHOLESTEROL 90 tablet 0   No current facility-administered medications on file prior to visit.    BP 138/86   Pulse 63   Temp (!) 97 F (36.1 C) (Temporal)   Ht 5\' 9"  (1.753 m)   Wt 185 lb (83.9 kg)   SpO2 98%   BMI 27.32 kg/m  Objective:   Physical Exam HENT:     Right Ear: Tympanic membrane and ear canal normal.     Left Ear: Tympanic membrane and ear canal normal.  Eyes:     Pupils: Pupils are equal, round, and reactive to light.  Cardiovascular:      Rate and Rhythm: Normal rate and regular rhythm.  Pulmonary:     Effort: Pulmonary effort is normal.     Breath sounds: Normal breath sounds.  Abdominal:     General: Bowel sounds are normal.     Palpations: Abdomen is soft.     Tenderness: There is no abdominal tenderness.  Musculoskeletal:        General: Normal range of motion.     Cervical back: Neck supple.  Skin:    General: Skin is warm and dry.     Comments: 0.5 cm rounded, raised, pink colored lesion to left pre auricular region  0.5 cm rounded, raised, scaly flesh colored lesion to right medial lower shin  Neurological:     Mental Status: He is alert and oriented to person, place, and time.     Cranial Nerves: No cranial nerve deficit.     Deep Tendon Reflexes:     Reflex Scores:      Patellar reflexes are 2+ on the right side and 2+ on the left side. Psychiatric:        Mood and Affect: Mood normal.           Assessment & Plan:  Preventative health care Assessment & Plan: Immunizations UTD. Colonoscopy UTD, due 2030 PSA due and pending.  Discussed the importance of a healthy diet and regular exercise in order for weight loss, and to reduce the risk of further co-morbidity.  Exam stable. Labs pending.  Follow up in 1 year for repeat physical.    Essential hypertension Assessment & Plan: Stable.  Continue losartan 100 mg daily. CMP pending   Mixed hyperlipidemia Assessment & Plan: Repeat lipid panel pending.  Continue simvastatin 20 mg daily.  Orders: -     Lipid panel -     Comprehensive metabolic panel  Screening for prostate cancer -     PSA,  Medicare  Skin lesions Assessment & Plan: Noted on exam today. Follow up with dermatology as scheduled.          Doreene Nest, NP

## 2024-01-09 NOTE — Assessment & Plan Note (Signed)
Immunizations UTD. Colonoscopy UTD, due 2030 PSA due and pending.  Discussed the importance of a healthy diet and regular exercise in order for weight loss, and to reduce the risk of further co-morbidity.  Exam stable. Labs pending.  Follow up in 1 year for repeat physical.

## 2024-01-09 NOTE — Assessment & Plan Note (Signed)
Stable.  Continue losartan 100 mg daily. CMP pending

## 2024-01-18 DIAGNOSIS — L309 Dermatitis, unspecified: Secondary | ICD-10-CM | POA: Diagnosis not present

## 2024-01-18 DIAGNOSIS — D225 Melanocytic nevi of trunk: Secondary | ICD-10-CM | POA: Diagnosis not present

## 2024-01-18 DIAGNOSIS — D2271 Melanocytic nevi of right lower limb, including hip: Secondary | ICD-10-CM | POA: Diagnosis not present

## 2024-01-18 DIAGNOSIS — L821 Other seborrheic keratosis: Secondary | ICD-10-CM | POA: Diagnosis not present

## 2024-01-18 DIAGNOSIS — D485 Neoplasm of uncertain behavior of skin: Secondary | ICD-10-CM | POA: Diagnosis not present

## 2024-01-18 DIAGNOSIS — L820 Inflamed seborrheic keratosis: Secondary | ICD-10-CM | POA: Diagnosis not present

## 2024-01-18 DIAGNOSIS — D2261 Melanocytic nevi of right upper limb, including shoulder: Secondary | ICD-10-CM | POA: Diagnosis not present

## 2024-01-18 DIAGNOSIS — D2262 Melanocytic nevi of left upper limb, including shoulder: Secondary | ICD-10-CM | POA: Diagnosis not present

## 2024-01-18 DIAGNOSIS — L570 Actinic keratosis: Secondary | ICD-10-CM | POA: Diagnosis not present

## 2024-01-18 DIAGNOSIS — C44319 Basal cell carcinoma of skin of other parts of face: Secondary | ICD-10-CM | POA: Diagnosis not present

## 2024-01-18 DIAGNOSIS — L905 Scar conditions and fibrosis of skin: Secondary | ICD-10-CM | POA: Diagnosis not present

## 2024-01-18 DIAGNOSIS — D2272 Melanocytic nevi of left lower limb, including hip: Secondary | ICD-10-CM | POA: Diagnosis not present

## 2024-01-25 ENCOUNTER — Telehealth: Payer: Self-pay

## 2024-01-25 NOTE — Telephone Encounter (Signed)
 Spoke to pt, scheduled appt with Dr. Alphonsus Sias on tomorrow, 01/26/24

## 2024-01-25 NOTE — Telephone Encounter (Signed)
 Copied from CRM [id number]. Topic: Clinical - Medication Question >> Jan 25, 2024  9:25 AM Elizebeth Brooking wrote: Reason for CRM: Patient called in wanting to know if Audria Nine can send him in a prescription of amoxicillin for his diverticulitis , stated he has sent it before a while ago when he needed it.  Patient stated if any questions to give him a call at [phone]

## 2024-01-26 ENCOUNTER — Encounter: Payer: Self-pay | Admitting: Internal Medicine

## 2024-01-26 ENCOUNTER — Ambulatory Visit: Admitting: Internal Medicine

## 2024-01-26 VITALS — BP 110/80 | HR 72 | Temp 98.3°F

## 2024-01-26 DIAGNOSIS — K5792 Diverticulitis of intestine, part unspecified, without perforation or abscess without bleeding: Secondary | ICD-10-CM | POA: Diagnosis not present

## 2024-01-26 MED ORDER — AMOXICILLIN-POT CLAVULANATE 875-125 MG PO TABS: 1.0000 | ORAL_TABLET | Freq: Two times a day (BID) | ORAL | 0 refills | Status: AC

## 2024-01-26 NOTE — Progress Notes (Signed)
 Subjective:    Patient ID: Alec Wilson, male    DOB: [birth year], 73 y.o.   MRN: [id number]  HPI Here due to abdominal pain  Mild discomfort in LLQ 2 days ago Has improved somewhat--"cleaner diet" Feels the same as past diverticulitis  Mother in hospital and then rehab in [place] and forth so not eating right  No fever No N/V Bowels are okay---tends to constipation but not lately  Current Outpatient Medications on File Prior to Visit  Medication Sig Dispense Refill   aspirin 81 MG tablet Take 81 mg by mouth daily.     fish oil-omega-3 fatty acids 1000 MG capsule Take 1 g by mouth daily.     losartan (COZAAR) 25 MG tablet TAKE 1 TABLET BY MOUTH EVERY DAY FOR BLOOD PRESSURE 90 tablet 0   Multiple Vitamins-Minerals (MULTIVITAMIN PO) Take 1 Centrum Silver multivitamin by mouth daily     simvastatin (ZOCOR) 20 MG tablet TAKE 1 TABLET DAILY IN EVENING FOR CHOLESTEROL 90 tablet 0   No current facility-administered medications on file prior to visit.    No Known Allergies  Past Medical History:  Diagnosis Date   Acute viral sinusitis 12/14/2018   Adenomatous colon polyp    last colonoscopy was 3 years ago.   Chest pressure 10/07/2019   Diverticulitis    Diverticulosis    Hemorrhoids summer 2005   s/p ligation of internal hemorrhoids   Hernia, inguinal, left 10/01/2018   Hernia, inguinal-right 01/22/2013   History of bilateral inguinal hernia repair 04/11/2022   HTN (hypertension)    Hyperlipidemia    Inguinal hernia    Internal hemorrhoids with bleeding and prolapse 02/15/2013   Irregular heart rhythm    Stress ECHO-Negative (09/22/98)   PAF (paroxysmal atrial fibrillation) (HCC) 1999   Stress ECHO-Negative (09/22/98) -> no further documented evidence of A. 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  Minimum heart rate 48 bpm.  Maximum heart rate sinus tachycardia 159 bpm. <1% PVCs.  No A. fib.  Bradycardia was noted during sleeping hours.  Patient had one episode of sensation of fluttering or skipped beats during which she had a short run of sinus rhythm with PVCs in bigeminy.   CATARACT EXTRACTION W/PHACO Left 12/06/2022   Procedure: CATARACT EXTRACTION PHACO AND INTRAOCULAR LENS PLACEMENT (IOC) LEFT  4.92  00:43.0;  Surgeon: Galen Manila, MD;  Location: [facility];  Service: Ophthalmology;  Laterality: Left;   CATARACT EXTRACTION W/PHACO Right 12/20/2022   Procedure: CATARACT EXTRACTION PHACO AND INTRAOCULAR LENS PLACEMENT (IOC) RIGHT 4.05 00:26.7;  Surgeon: Galen Manila, MD;  Location[facility];  Service: Ophthalmology;  Laterality: Right;   COLONOSCOPY  2013   HEMORRHOID SURGERY  2005   INGUINAL HERNIA REPAIR Right 2019   NM MYOVIEW LTD  11/2019   EF 60%..  Medium size, mild severity fixed apical defect with normal wall motion consistent with artifact.  LOW Risk   THROAT SURGERY  childhood   TRANSTHORACIC ECHOCARDIOGRAM  12/04/2019    EF 60 to 65%.  No R WMA.  Mild basal septal hypertrophy.  Normal RV function but mildly increased size.  Normal atrial sizes.  Mild aortic sclerosis, no stenosis.  VASECTOMY      Family History  Problem Relation Age of Onset   Coronary artery disease Father 48       CABG at age 81, MI at 53.   Parkinson's disease Father    Colon polyps Father    Congestive Heart Failure Father    Hypertension Brother    Hypertension Mother    Arthritis Mother    Heart attack Mother 57   Cancer Maternal Grandmother    Cancer Maternal Grandfather    Heart attack Paternal Grandfather 14   Rectal cancer Neg Hx    Stomach cancer Neg Hx    Colon cancer Neg Hx    Esophageal cancer Neg Hx     Social History    Socioeconomic History   Marital status: Married    Spouse name: Not on file   Number of children: 2   Years of education: Not on file   Highest education level: Bachelor's degree (e.g., BA, AB, BS)  Occupational History   Occupation: retired  Tobacco Use   Smoking status: Former    Current packs/day: 1.00    Types: Cigarettes   Smokeless tobacco: Former    Quit date: 02/19/1998  Vaping Use   Vaping status: Never Used  Substance and Sexual Activity   Alcohol use: Yes    Alcohol/week: 2.0 standard drinks of alcohol    Types: 2 Glasses of wine per week    Comment: a glass of wine weekly   Drug use: No   Sexual activity: Yes    Birth control/protection: Surgical  Other Topics Concern   Not on file  Social History Narrative   From [place].   Works at [employer] in [employer].   Married.  2 children: 39, 27.    Daughters live in [place] and [place].   Enjoys golfing, visiting vineyards, traveling --> exercises 6 days a week 1 walking and playing golf      Quit smoking in 1999.   Drinks roughly 5 glasses of wine a week.   Social Drivers of Corporate investment banker Strain: Low Risk  (01/05/2024)   Overall Financial Resource Strain (CARDIA)    Difficulty of Paying Living Expenses: Not hard at all  Food Insecurity: No Food Insecurity (01/05/2024)   Hunger Vital Sign    Worried About Running Out of Food in the Last Year: Never true    Ran Out of Food in the Last Year: Never true  Transportation Needs: No Transportation Needs (01/05/2024)   PRAPARE - Administrator, Civil Service (Medical): No    Lack of Transportation (Non-Medical): No  Physical Activity: Sufficiently Active (01/05/2024)   Exercise Vital Sign    Days of Exercise per Week: 5 days    Minutes of Exercise per Session: 50 min  Stress: No Stress Concern Present (01/05/2024)   [employer] of Occupational Health - Occupational Stress Questionnaire    Feeling of Stress : Only a little   Social Connections: Socially Integrated (01/05/2024)   Social Connection and Isolation Panel [NHANES]    Frequency of Communication with Friends and Family: Three times a week    Frequency of Social Gatherings with Friends and Family: Once a week    Attends Religious Services: More than 4 times per year    Active Member of [employer] or Organizations: Yes    Attends Engineer, structural: More than 4 times per year    Marital Status: Married  Intimate  Partner Violence: Not At Risk (11/28/2023)   Humiliation, Afraid, Rape, and Kick questionnaire    Fear of Current or Ex-Partner: No    Emotionally Abused: No    Physically Abused: No    Sexually Abused: No   Review of Systems No urinary symptoms No cough or SOB     Objective:   Physical Exam Constitutional:      Appearance: Normal appearance.  Pulmonary:     Effort: Pulmonary effort is normal.     Breath sounds: Normal breath sounds. No wheezing or rales.  Abdominal:     General: Bowel sounds are normal. There is no distension.     Palpations: Abdomen is soft.     Tenderness: There is no guarding or rebound.     Comments: Mild LLQ tenderness--no trouble with deep palpation though  Neurological:     Mental Status: He is alert.            Assessment & Plan:

## 2024-01-26 NOTE — Assessment & Plan Note (Signed)
 Had one bad day and improving now with changes in eating Discussed that antibiotics should be reserved for persistent or more severe cases Clearly nothing to indicate abscess now Will give Rx for augmentin 875 bid x 7 days to take if worsens

## 2024-02-28 ENCOUNTER — Ambulatory Visit: Payer: Self-pay

## 2024-02-28 ENCOUNTER — Ambulatory Visit (INDEPENDENT_AMBULATORY_CARE_PROVIDER_SITE_OTHER): Admitting: Nurse Practitioner

## 2024-02-28 ENCOUNTER — Ambulatory Visit (INDEPENDENT_AMBULATORY_CARE_PROVIDER_SITE_OTHER)
Admission: RE | Admit: 2024-02-28 | Discharge: 2024-02-28 | Disposition: A | Discharge status: Home / Self Care | Source: Ambulatory Visit | Attending: Nurse Practitioner | Admitting: Nurse Practitioner

## 2024-02-28 VITALS — BP 136/80 | HR 64 | Temp 97.6°F | Ht 69.0 in | Wt 187.4 lb

## 2024-02-28 DIAGNOSIS — R051 Acute cough: Secondary | ICD-10-CM | POA: Diagnosis not present

## 2024-02-28 DIAGNOSIS — J22 Unspecified acute lower respiratory infection: Secondary | ICD-10-CM

## 2024-02-28 DIAGNOSIS — R058 Other specified cough: Secondary | ICD-10-CM | POA: Diagnosis not present

## 2024-02-28 MED ORDER — BENZONATATE 100 MG PO CAPS: 100.0000 mg | ORAL_CAPSULE | Freq: Three times a day (TID) | ORAL | 0 refills | Status: AC | PRN

## 2024-02-28 MED ORDER — DOXYCYCLINE HYCLATE 100 MG PO TABS: 100.0000 mg | ORAL_TABLET | Freq: Two times a day (BID) | ORAL | 0 refills | Status: AC

## 2024-02-28 NOTE — Patient Instructions (Addendum)
 It was a pleasure seeing you today.  Prescriptions have been sent to your pharmacy.  We will contact you once your XR results.  Follow up if you don't improve.

## 2024-02-28 NOTE — Assessment & Plan Note (Addendum)
 Will treat with benzonatate 100 mg TID PRN for cough. CXR pending.   I evaluated patient, was consulted regarding treatment, and agree with assessment and plan per Denice Bors RN, FNP Student   Audria Nine, DNP, AGNP-C

## 2024-02-28 NOTE — Telephone Encounter (Signed)
 Will eval at office visit

## 2024-02-28 NOTE — Telephone Encounter (Signed)
  Chief Complaint: Cough Symptoms: cough, congestion Frequency: continuous  Pertinent Negatives: Patient denies chest pain, shortness of breath Disposition: [] ED /[] Urgent Care (no appt availability in office) / [x] Appointment(In office/virtual)/ []  [facility]/ [] Home Care/ [] Refused Recommended Disposition /[]  Mobile Bus/ []  Follow-up with PCP Additional Notes:  Cough for two weeks, it was improving but now worsening again. Feels congested 'deep in lungs'. Past couple days cough is keeping him awake at night. Home covid tests X 2 are negative. Had fever in start of illness that has resolved. Used otc robitussin with minimal effect, picked up Mucinex but hasn't tried that yet. Acute visit scheduled with an alternate provider. Educated on care advice as documented in protocol, patient verbalized understanding.   Message from [place] E sent at 02/28/2024  9:31 AM EDT  Summary: disrupted sleep coughing spell, cough is persistent   Copied From CRM [id number]. Reason for Triage: patient calling in. Coughing for 2 weeks, last night cough disrupted sleep coughing spell, cough is persistent  Patient would like an appt today or tomorrow. Patient phone # [phone] ok to leave a detailed message.      Reason for Disposition  [1] Continuous (nonstop) coughing interferes with work or school AND [2] no improvement using cough treatment per Care Advice  Protocols used: Cough - Acute Productive-A-AH

## 2024-02-28 NOTE — Assessment & Plan Note (Addendum)
 Post viral cough persisting longer than 2 weeks with remittance and then worsening characteristics. Hx of tobacco smoker. Considered potential for atypical infection and will cover empirically with doxycycline 100 mg BID for 7 days. Will write for benzonatate 100 mg TID prn for cough to provide symptomatic relief. Pt will also utilize fluticasone nasal spray OTC treatment for post nasal drip. CXR pending. Pt provided return precautions.   DDx considered but not limited to viral vs infectious vs allergic bronchitis and pneumonia.    I evaluated patient, was consulted regarding treatment, and agree with assessment and plan per Denice Bors RN, FNP Student   Audria Nine, DNP, AGNP-C

## 2024-02-28 NOTE — Progress Notes (Signed)
 Acute Office Visit  Subjective:     Patient ID: Alec Wilson, male    DOB: 02/11/1951, 73 y.o.   MRN: [id number]  Chief Complaint  Patient presents with   Cough    Pt complains of persistent cough. Pt states that he had a could two weeks ago and the cough has been lingering. Dry cough that is worse at night. No chest pain. Taken OTC robitussin but not much difference.     HPI  Alec Wilson is a 73 YOM with a hx of HTN and HLD. He is a patient of Vernona Rieger. He is here today for a persistent cough x 2 weeks. When symptoms started he had associated HA, fever with T-max of 100.5, and myalgias. He had 2 negative home COVID tests. Denies sick contacts.  He started to improve, but after being outside playing golf the cough returned and it is worse at night. Most of the time the cough is dry, but he will occasionally bring up some clear sputum. He has tried Robitussin DM without relief. He denies ShOB, orthopnea or CP.   Review of Systems  Constitutional:  Negative for chills and fever.  HENT:  Negative for congestion, sinus pain and sore throat.   Respiratory:  Positive for cough and sputum production (occasional). Negative for hemoptysis and shortness of breath.   Cardiovascular:  Negative for chest pain.  Skin:  Negative for rash.  Neurological:  Negative for dizziness and headaches.        Objective:    BP 136/80   Pulse 64   Temp 97.6 F (36.4 C) (Oral)   Ht 5\' 9"  (1.753 m)   Wt 85 kg   SpO2 96%   BMI 27.67 kg/m    Physical Exam Constitutional:      Appearance: Normal appearance. He is not ill-appearing.  HENT:     Right Ear: Tympanic membrane, ear canal and external ear normal.     Left Ear: Tympanic membrane, ear canal and external ear normal.     Nose: Nose normal.     Right Sinus: No maxillary sinus tenderness or frontal sinus tenderness.     Left Sinus: No maxillary sinus tenderness or frontal sinus tenderness.     Mouth/Throat:     Mouth: Mucous membranes are  moist.     Pharynx: Oropharynx is clear.  Eyes:     General: No allergic shiner.    Conjunctiva/sclera: Conjunctivae normal.  Cardiovascular:     Rate and Rhythm: Normal rate and regular rhythm.     Pulses: Normal pulses.     Heart sounds: Normal heart sounds. No murmur heard.    No friction rub. No gallop.  Pulmonary:     Effort: Pulmonary effort is normal.     Breath sounds: Examination of the right-lower field reveals rhonchi. Rhonchi present.     Comments: Congested cough noted during exam Lymphadenopathy:     Cervical: No cervical adenopathy.  Neurological:     Mental Status: He is alert.     No results found for any visits on 02/28/24.      Assessment & Plan:   Problem List Items Addressed This Visit     Lower respiratory infection - Primary   Post viral cough persisting longer than 2 weeks with remittance and then worsening characteristics. Hx of tobacco smoker. Considered potential for atypical infection and will cover empirically with doxycycline 100 mg BID for 7 days. Will write for benzonatate 100 mg TID prn for  cough to provide symptomatic relief. Pt will also utilize fluticasone nasal spray OTC treatment for post nasal drip. CXR pending. Pt provided return precautions.   DDx considered but not limited to viral vs infectious vs allergic bronchitis and pneumonia.        Relevant Medications   doxycycline (VIBRA-TABS) 100 MG tablet   Other Relevant Orders   DG Chest 2 View   Acute cough   Will treat with benzonatate 100 mg TID PRN for cough. CXR pending.       Relevant Medications   benzonatate (TESSALON) 100 MG capsule   Other Relevant Orders   DG Chest 2 View    Meds ordered this encounter  Medications   benzonatate (TESSALON) 100 MG capsule    Sig: Take 1 capsule (100 mg total) by mouth 3 (three) times daily as needed for up to 7 days for cough.    Dispense:  21 capsule    Refill:  0   doxycycline (VIBRA-TABS) 100 MG tablet    Sig: Take 1 tablet  (100 mg total) by mouth 2 (two) times daily.    Dispense:  14 tablet    Refill:  0    Return if symptoms worsen or fail to improve.  Murvin Donning, RN

## 2024-02-28 NOTE — Telephone Encounter (Signed)
 Noted and appreciate Matt, NP evaluation.

## 2024-02-28 NOTE — Progress Notes (Signed)
 Acute Office Visit  Subjective:     Patient ID: Alec Wilson, male    DOB: 07-09-1951, 73 y.o.   MRN: [id number]  Chief Complaint  Patient presents with   Cough    Pt complains of persistent cough. Pt states that he had a could two weeks ago and the cough has been lingering. Dry cough that is worse at night. No chest pain. Taken OTC robitussin but not much difference.      Patient is in today for cough with history of HTN, diverticulitis, HLD. Of note patient was recently treated with Augmentin for diverticulitis.  Patient presenting with a cough for 2 weeks.  When symptoms started he had headache, fever with a max temperature of 100.5 and myalgias.  He did test himself for COVID twice at home both were negative.  Denies any sick contacts inclusive of spouse at home.  He started to improve but then got worse over the weekend while he was outside playing golf.  He describes the cough is worse at night with intermittent production of a clear sputum.  He has been using Robitussin DM with minimal relief.  Review of Systems  Constitutional:  Negative for chills and fever.  Respiratory:  Positive for cough and sputum production. Negative for shortness of breath.   Cardiovascular:  Negative for chest pain.  Gastrointestinal:  Negative for abdominal pain, constipation, diarrhea, nausea and vomiting.  Neurological:  Negative for headaches.  Psychiatric/Behavioral:  Negative for suicidal ideas.         Objective:    BP 136/80   Pulse 64   Temp 97.6 F (36.4 C) (Oral)   Ht 5\' 9"  (1.753 m)   Wt 187 lb 6.4 oz (85 kg)   SpO2 96%   BMI 27.67 kg/m    Physical Exam Vitals and nursing note reviewed.  Constitutional:      Appearance: Normal appearance.  HENT:     Right Ear: Tympanic membrane, ear canal and external ear normal.     Left Ear: Tympanic membrane, ear canal and external ear normal.     Nose:     Right Sinus: No maxillary sinus tenderness or frontal sinus tenderness.      Left Sinus: No maxillary sinus tenderness or frontal sinus tenderness.     Mouth/Throat:     Mouth: Mucous membranes are moist.     Pharynx: Oropharynx is clear.  Cardiovascular:     Rate and Rhythm: Normal rate and regular rhythm.     Heart sounds: Normal heart sounds.  Pulmonary:     Effort: Pulmonary effort is normal.     Breath sounds: Normal breath sounds.  Lymphadenopathy:     Cervical: No cervical adenopathy.  Neurological:     Mental Status: He is alert.     No results found for any visits on 02/28/24.      Assessment & Plan:   Problem List Items Addressed This Visit       Respiratory   Lower respiratory infection - Primary   Post viral cough persisting longer than 2 weeks with remittance and then worsening characteristics. Hx of tobacco smoker. Considered potential for atypical infection and will cover empirically with doxycycline 100 mg BID for 7 days. Will write for benzonatate 100 mg TID prn for cough to provide symptomatic relief. Pt will also utilize fluticasone nasal spray OTC treatment for post nasal drip. CXR pending. Pt provided return precautions.   DDx considered but not limited to viral vs infectious  vs allergic bronchitis and pneumonia.        Relevant Medications   doxycycline (VIBRA-TABS) 100 MG tablet   Other Relevant Orders   DG Chest 2 View     Other   Acute cough   Will treat with benzonatate 100 mg TID PRN for cough. CXR pending.       Relevant Medications   benzonatate (TESSALON) 100 MG capsule   Other Relevant Orders   DG Chest 2 View    Meds ordered this encounter  Medications   benzonatate (TESSALON) 100 MG capsule    Sig: Take 1 capsule (100 mg total) by mouth 3 (three) times daily as needed for up to 7 days for cough.    Dispense:  21 capsule    Refill:  0   doxycycline (VIBRA-TABS) 100 MG tablet    Sig: Take 1 tablet (100 mg total) by mouth 2 (two) times daily.    Dispense:  14 tablet    Refill:  0    Return if symptoms  worsen or fail to improve.  Audria Nine, NP

## 2024-02-29 ENCOUNTER — Encounter: Payer: Self-pay | Admitting: Nurse Practitioner

## 2024-03-12 ENCOUNTER — Other Ambulatory Visit: Payer: Self-pay | Admitting: Primary Care

## 2024-03-12 DIAGNOSIS — E785 Hyperlipidemia, unspecified: Secondary | ICD-10-CM

## 2024-03-12 DIAGNOSIS — I10 Essential (primary) hypertension: Secondary | ICD-10-CM

## 2024-03-18 DIAGNOSIS — C44319 Basal cell carcinoma of skin of other parts of face: Secondary | ICD-10-CM | POA: Diagnosis not present

## 2024-03-25 DIAGNOSIS — C44219 Basal cell carcinoma of skin of left ear and external auricular canal: Secondary | ICD-10-CM | POA: Diagnosis not present

## 2024-03-25 DIAGNOSIS — C44319 Basal cell carcinoma of skin of other parts of face: Secondary | ICD-10-CM | POA: Diagnosis not present

## 2024-07-11 ENCOUNTER — Telehealth: Payer: Self-pay | Admitting: Internal Medicine

## 2024-07-11 NOTE — Telephone Encounter (Unsigned)
 Copied from CRM #[id number]. Topic: Clinical - Medication Refill >> Jul 11, 2024  8:51 AM Vena HERO wrote: Medication: amoxicillin -clavulanate (AUGMENTIN ) 875-125 MG tablet  Has the patient contacted their pharmacy? Yes (Agent: If no, request that the patient contact the pharmacy for the refill. If patient does not wish to contact the pharmacy document the reason why and proceed with request.) (Agent: If yes, when and what did the pharmacy advise?)  This is the patient's preferred pharmacy:  [phone] [facility], [place] - [address] [place] [address] Phone: [phone] Fax: [phone]    Is this the correct pharmacy for this prescription? Yes If no, delete pharmacy and type the correct one.   Has the prescription been filled recently? No  Is the patient out of the medication? Yes  Has the patient been seen for an appointment in the last year OR does the patient have an upcoming appointment? Yes  Can we respond through MyChart? Yes  Agent: Please be advised that Rx refills may take up to 3 business days. We ask that you follow-up with your pharmacy.

## 2024-07-12 ENCOUNTER — Ambulatory Visit: Payer: Self-pay

## 2024-07-12 ENCOUNTER — Ambulatory Visit (INDEPENDENT_AMBULATORY_CARE_PROVIDER_SITE_OTHER): Admitting: General Practice

## 2024-07-12 ENCOUNTER — Ambulatory Visit: Payer: Self-pay | Admitting: General Practice

## 2024-07-12 ENCOUNTER — Encounter: Payer: Self-pay | Admitting: General Practice

## 2024-07-12 VITALS — BP 112/64 | HR 67 | Temp 97.9°F | Ht 69.0 in | Wt 184.0 lb

## 2024-07-12 DIAGNOSIS — R1032 Left lower quadrant pain: Secondary | ICD-10-CM | POA: Diagnosis not present

## 2024-07-12 DIAGNOSIS — K5792 Diverticulitis of intestine, part unspecified, without perforation or abscess without bleeding: Secondary | ICD-10-CM

## 2024-07-12 DIAGNOSIS — K5900 Constipation, unspecified: Secondary | ICD-10-CM

## 2024-07-12 LAB — CBC WITH DIFFERENTIAL/PLATELET
Basophils Absolute: 0 K/uL (ref 0.0–0.1)
Basophils Relative: 0.7 % (ref 0.0–3.0)
Eosinophils Absolute: 0.5 K/uL (ref 0.0–0.7)
Eosinophils Relative: 8 % — ABNORMAL HIGH (ref 0.0–5.0)
HCT: 42.1 % (ref 39.0–52.0)
Hemoglobin: 14 g/dL (ref 13.0–17.0)
Lymphocytes Relative: 13.4 % (ref 12.0–46.0)
Lymphs Abs: 0.8 K/uL (ref 0.7–4.0)
MCHC: 33.2 g/dL (ref 30.0–36.0)
MCV: 94.3 fl (ref 78.0–100.0)
Monocytes Absolute: 0.4 K/uL (ref 0.1–1.0)
Monocytes Relative: 6.5 % (ref 3.0–12.0)
Neutro Abs: 4.4 K/uL (ref 1.4–7.7)
Neutrophils Relative %: 71.4 % (ref 43.0–77.0)
Platelets: 303 K/uL (ref 150.0–400.0)
RBC: 4.46 Mil/uL (ref 4.22–5.81)
RDW: 13.2 % (ref 11.5–15.5)
WBC: 6.2 K/uL (ref 4.0–10.5)

## 2024-07-12 LAB — COMPREHENSIVE METABOLIC PANEL WITH GFR
ALT: 16 U/L (ref 0–53)
AST: 16 U/L (ref 0–37)
Albumin: 3.9 g/dL (ref 3.5–5.2)
Alkaline Phosphatase: 103 U/L (ref 39–117)
BUN: 17 mg/dL (ref 6–23)
CO2: 28 meq/L (ref 19–32)
Calcium: 9.1 mg/dL (ref 8.4–10.5)
Chloride: 101 meq/L (ref 96–112)
Creatinine, Ser: 0.8 mg/dL (ref 0.40–1.50)
GFR: 89.39 mL/min (ref >60.00)
Glucose, Bld: 122 mg/dL — ABNORMAL HIGH (ref 70–99)
Potassium: 4.5 meq/L (ref 3.5–5.1)
Sodium: 137 meq/L (ref 135–145)
Total Bilirubin: 0.6 mg/dL (ref 0.2–1.2)
Total Protein: 6.6 g/dL (ref 6.0–8.3)

## 2024-07-12 MED ORDER — AMOXICILLIN-POT CLAVULANATE 875-125 MG PO TABS
1.0000 | ORAL_TABLET | Freq: Two times a day (BID) | ORAL | 0 refills | Status: AC
Start: 2024-07-12 — End: 2024-07-22

## 2024-07-12 NOTE — Telephone Encounter (Signed)
 FYI Only or Action Required?: FYI only for provider.  Patient was last seen in primary care on 02/28/2024 by Wendee Lynwood HERO, NP.  Called Nurse Triage reporting Abdominal Pain.  Symptoms began several days ago.  Interventions attempted: Nothing.  Symptoms are: LLQ abdominal mild to moderate pain gradually worsening.  Triage Disposition: See HCP Within 4 Hours (Or PCP Triage)  Patient/caregiver understands and will follow disposition?: Yes                  Message from [facility] sent at 07/12/2024 11:05 AM EDT  Summary: Diverticulitis   Diverticulitis flared up and patient would like a call back. He called for a refill on antibiotics yesterday cause he said that's what he has taken before. I advised that refills can take up to 3bds and asked him was he symptomatic. He said that he experienced a flare up but it isn't too bad.  [phone] (M)         Reason for Disposition  [1] MILD-MODERATE pain AND [2] constant AND [3] present > 2 hours  Answer Assessment - Initial Assessment Questions Patient states he was seen back in March with Dr Jimmy. He states last time he was placed on the Augmentin  and it helped a lot.   1. LOCATION: Where does it hurt?      LLQ.  2. RADIATION: Does the pain shoot anywhere else? (e.g., chest, back)     No.  3. ONSET: When did the pain begin? (Minutes, hours or days ago)      X couple of days.  4. SUDDEN: Gradual or sudden onset?     He states he thinks it popped on him suddenly about 2 nights ago.  5. PATTERN Does the pain come and go, or is it constant?     Fairly constant, worsens and eases off  6. SEVERITY: How bad is the pain?  (e.g., Scale 1-10; mild, moderate, or severe)     3-4/10.  7. RECURRENT SYMPTOM: Have you ever had this type of stomach pain before? If Yes, ask: When was the last time? and What happened that time?      Yes, March 2025 was last flare up.  8. CAUSE: What do you think is causing  the stomach pain? (e.g., gallstones, recent abdominal surgery)     He states this feels like a diverticulitis flare up.  9. RELIEVING/AGGRAVATING FACTORS: What makes it better or worse? (e.g., antacids, bending or twisting motion, bowel movement)     Worse later in the day.  10. OTHER SYMPTOMS: Do you have any other symptoms? (e.g., back pain, diarrhea, fever, urination pain, vomiting)       Denies nausea, vomiting, fever, diarrhea, back pain, pain with urination, blood in urine or stool. He states he deals with constipation off and on. Last BM was this morning.  Protocols used: Abdominal Pain - Male-A-AH

## 2024-07-12 NOTE — Patient Instructions (Addendum)
 Stop by the lab prior to leaving today. I will notify you of your results once received.   Start clear liquid diet and upgrade to full liquid diet when pain has resolved. Then upgrade to soft diet.   Update me on Monday regarding your symptoms.   It was a pleasure to see you today!

## 2024-07-12 NOTE — Progress Notes (Signed)
 Established Patient Office Visit  Subjective   Patient ID: Alec Wilson, male    DOB: 09-22-1951  Age: 73 y.o. MRN: [id number]  Chief Complaint  Patient presents with   Abdominal Pain    Lower left side x 2 days; comes and goes with intensity. Patient has dx of diverticulitis and feels like this again.     Abdominal Pain Associated symptoms include constipation. Pertinent negatives include no diarrhea, dysuria, fever, frequency, headaches, nausea or vomiting.    Alec Wilson is a 73 year old male, patient of Alec Gaskins, NP with past medical history of HTN, HLD, arthritis presents today for an acute visit.   Discussed the use of AI scribe software for clinical note transcription with the patient, who gave verbal consent to proceed.  History of Present Illness He has been experiencing left lower abdominal pain for the past two to three days, which began at night. The pain is dull, persistent, and fluctuates in intensity, reaching a maximum of 5 out of 10. It 'comes and goes' but never completely resolves.  No associated symptoms such as nausea, vomiting, fever, chills, diarrhea, or changes in his usual constipation pattern. He has a history of diverticulitis and notes that the current pain is similar to previous episodes. He recently consumed nuts and corn, which he associates with past flare-ups.  He uses Miralax intermittently and started it again just before the current symptoms began. He had a bowel movement this morning without difficulty.  During the review of symptoms, no dizziness, headaches, urinary symptoms, chest pain, or shortness of breath. The pain is localized to the left lower quadrant, with no pain in the left upper quadrant or the right side of his abdomen.  His last colonoscopy in 2023 showed diverticulosis in the sigmoid and descending colon.    Patient Active Problem List   Diagnosis Date Noted   Lower respiratory infection 02/28/2024   Acute cough 02/28/2024    Erectile dysfunction 12/07/2021   Skin lesions 07/14/2020   Palpitations 01/03/2020   Welcome to Medicare preventive visit 10/01/2018   Arthritis 05/10/2017   Preventative health care 05/11/2015   History of colonic polyps 02/17/2015   Essential hypertension    Acute diverticulitis    Hyperlipidemia    Past Medical History:  Diagnosis Date   Acute viral sinusitis 12/14/2018   Adenomatous colon polyp    last colonoscopy was 3 years ago.   Chest pressure 10/07/2019   Diverticulitis    Diverticulosis    Hemorrhoids summer 2005   s/p ligation of internal hemorrhoids   Hernia, inguinal, left 10/01/2018   Hernia, inguinal-right 01/22/2013   History of bilateral inguinal hernia repair 04/11/2022   HTN (hypertension)    Hyperlipidemia    Inguinal hernia    Internal hemorrhoids with bleeding and prolapse 02/15/2013   Irregular heart rhythm    Stress ECHO-Negative (09/22/98)   PAF (paroxysmal atrial fibrillation) (HCC) 1999   Stress ECHO-Negative (09/22/98) -> no further documented evidence of A. 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  Minimum heart rate 48 bpm.  Maximum heart rate sinus tachycardia 159 bpm. <1% PVCs.  No A. fib.  Bradycardia was noted during sleeping hours.  Patient  had one episode of sensation of fluttering or skipped beats during which she had a short run of sinus rhythm with PVCs in bigeminy.   CATARACT EXTRACTION W/PHACO Left 12/06/2022   Procedure: CATARACT EXTRACTION PHACO AND INTRAOCULAR LENS PLACEMENT (IOC) LEFT  4.92  00:43.0;  Surgeon: Jaye Fallow, MD;  Location: [facility];  Service: Ophthalmology;  Laterality: Left;   CATARACT EXTRACTION W/PHACO Right 12/20/2022   Procedure:  CATARACT EXTRACTION PHACO AND INTRAOCULAR LENS PLACEMENT (IOC) RIGHT 4.05 00:26.7;  Surgeon: Jaye Fallow, MD;  Location: [facility];  Service: Ophthalmology;  Laterality: Right;   COLONOSCOPY  2013   HEMORRHOID SURGERY  2005   INGUINAL HERNIA REPAIR Right 2019   NM MYOVIEW  LTD  11/2019   EF 60%..  Medium size, mild severity fixed apical defect with normal wall motion consistent with artifact.  LOW Risk   THROAT SURGERY  childhood   TRANSTHORACIC ECHOCARDIOGRAM  12/04/2019    EF 60 to 65%.  No R WMA.  Mild basal septal hypertrophy.  Normal RV function but mildly increased size.  Normal atrial sizes.  Mild aortic sclerosis, no stenosis.   VASECTOMY     No Known Allergies       07/12/2024   11:59 AM 02/28/2024    2:20 PM 01/09/2024   10:08 AM  Depression screen PHQ 2/9  Decreased Interest 0 0 0  Down, Depressed, Hopeless 0 0 0  PHQ - 2 Score 0 0 0  Altered sleeping 0 0   Tired, decreased energy 0 0   Change in appetite 0 0   Feeling bad or failure about yourself  0 0   Trouble concentrating 0 0   Moving slowly or fidgety/restless 0 0   Suicidal thoughts 0 0   PHQ-9 Score 0 0   Difficult doing work/chores Not difficult at all Not difficult at all        07/12/2024   12:00 PM 02/28/2024    2:20 PM  GAD 7 : Generalized Anxiety Score  Nervous, Anxious, on Edge 0 0  Control/stop worrying 0 0  Worry too much - different things 0 0  Trouble relaxing 0 0  Restless 0 0  Easily annoyed or irritable 0 0  Afraid - awful might happen 0 0  Total GAD 7 Score 0 0  Anxiety Difficulty Not difficult at all Not difficult at all      Review of Systems  Constitutional:  Negative for chills and fever.  Respiratory:  Negative for shortness of breath.   Cardiovascular:  Negative for chest pain.  Gastrointestinal:  Positive for abdominal pain and constipation. Negative for diarrhea, heartburn, nausea and vomiting.  Genitourinary:  Negative for dysuria, frequency and urgency.   Neurological:  Negative for dizziness and headaches.  Endo/Heme/Allergies:  Negative for polydipsia.  Psychiatric/Behavioral:  Negative for depression and suicidal ideas. The patient is not nervous/anxious.       Objective:     BP 112/64   Pulse 67   Temp 97.9 F (36.6 C) (Temporal)   Ht 5' 9 (1.753 m)   Wt 184 lb (83.5 kg)   SpO2 97%   BMI 27.17 kg/m  BP Readings from Last 3 Encounters:  07/12/24 112/64  02/28/24 136/80  01/26/24 110/80   Wt Readings from Last 3 Encounters:  07/12/24 184 lb (83.5 kg)  02/28/24 187 lb 6.4 oz (85 kg)  01/09/24 185 lb (83.9 kg)      Physical Exam Vitals and nursing note reviewed.  Constitutional:      Appearance: Normal appearance.  Cardiovascular:     Rate and Rhythm: Normal rate and regular rhythm.     Pulses: Normal pulses.     Heart sounds: Normal heart sounds.  Pulmonary:     Effort: Pulmonary effort is normal.     Breath sounds: Normal breath sounds.  Abdominal:     General: Bowel sounds are normal. There is no distension.     Tenderness: There is abdominal tenderness in the left lower quadrant.  Neurological:     Mental Status: He is alert and oriented to person, place, and time.  Psychiatric:        Mood and Affect: Mood normal.        Behavior: Behavior normal.        Thought Content: Thought content normal.        Judgment: Judgment normal.      No results found for any visits on 07/12/24.     The 10-year ASCVD risk score (Arnett DK, et al., 2019) is: 12.4%    Assessment & Plan:  LLQ pain -     CBC with Differential/Platelet -     Comprehensive metabolic panel with GFR      Assessment and Plan Assessment & Plan Diverticulitis, left lower quadrant Acute diverticulitis suspected in the left lower quadrant.  - STAT CBC with diff and CMP pending.  - Initiate clear liquid diet until pain resolves. - Advance to full liquid diet if tolerated. - await reuslts. - Instruct to go to ER if fever, chills,  nausea, vomiting, or uncontrollable pain develop. - Provide dietary handout on diverticulitis management. - Request MyChart update on symptoms by Monday.  Chronic constipation Chronic constipation reported, with no change in bowel habits since the onset of current symptoms. - Recommend use of stool softener like Colace, 2 at bedtime, to maintain regular bowel movements. - Advise adequate water intake and fiber consumption to prevent constipation.  Diverticulosis of sigmoid and descending colon Diverticulosis present in the sigmoid and descending colon, confirmed by previous colonoscopy. - Advise avoidance of nuts, seeds, and popcorn to prevent exacerbation of diverticulosis.   Return if symptoms worsen or fail to improve.    Carrol Aurora, NP

## 2024-07-26 DIAGNOSIS — L820 Inflamed seborrheic keratosis: Secondary | ICD-10-CM | POA: Diagnosis not present

## 2024-07-26 DIAGNOSIS — D2262 Melanocytic nevi of left upper limb, including shoulder: Secondary | ICD-10-CM | POA: Diagnosis not present

## 2024-07-26 DIAGNOSIS — D485 Neoplasm of uncertain behavior of skin: Secondary | ICD-10-CM | POA: Diagnosis not present

## 2024-07-26 DIAGNOSIS — D0461 Carcinoma in situ of skin of right upper limb, including shoulder: Secondary | ICD-10-CM | POA: Diagnosis not present

## 2024-07-26 DIAGNOSIS — L570 Actinic keratosis: Secondary | ICD-10-CM | POA: Diagnosis not present

## 2024-07-26 DIAGNOSIS — Z85828 Personal history of other malignant neoplasm of skin: Secondary | ICD-10-CM | POA: Diagnosis not present

## 2024-07-26 DIAGNOSIS — D225 Melanocytic nevi of trunk: Secondary | ICD-10-CM | POA: Diagnosis not present

## 2024-07-26 DIAGNOSIS — D2261 Melanocytic nevi of right upper limb, including shoulder: Secondary | ICD-10-CM | POA: Diagnosis not present

## 2024-07-26 DIAGNOSIS — D0471 Carcinoma in situ of skin of right lower limb, including hip: Secondary | ICD-10-CM | POA: Diagnosis not present

## 2024-07-26 DIAGNOSIS — D2272 Melanocytic nevi of left lower limb, including hip: Secondary | ICD-10-CM | POA: Diagnosis not present

## 2024-08-07 DIAGNOSIS — D0472 Carcinoma in situ of skin of left lower limb, including hip: Secondary | ICD-10-CM | POA: Diagnosis not present

## 2024-08-07 DIAGNOSIS — D0461 Carcinoma in situ of skin of right upper limb, including shoulder: Secondary | ICD-10-CM | POA: Diagnosis not present

## 2024-08-08 DIAGNOSIS — Z9889 Other specified postprocedural states: Secondary | ICD-10-CM | POA: Diagnosis not present

## 2024-08-08 DIAGNOSIS — H26491 Other secondary cataract, right eye: Secondary | ICD-10-CM | POA: Diagnosis not present

## 2024-09-05 ENCOUNTER — Other Ambulatory Visit: Payer: Self-pay | Admitting: Primary Care

## 2024-09-05 DIAGNOSIS — E785 Hyperlipidemia, unspecified: Secondary | ICD-10-CM

## 2024-09-05 DIAGNOSIS — I10 Essential (primary) hypertension: Secondary | ICD-10-CM

## 2024-09-05 NOTE — Telephone Encounter (Signed)
 Patient is due for CPE/follow up for late February 2026, this will be required prior to any further refills.  Please schedule, thank you!

## 2024-09-12 NOTE — Telephone Encounter (Signed)
 Called pt and schedule appt for cpe

## 2024-11-25 ENCOUNTER — Ambulatory Visit (INDEPENDENT_AMBULATORY_CARE_PROVIDER_SITE_OTHER)

## 2024-11-25 VITALS — BP 118/68 | Ht 69.0 in | Wt 177.0 lb

## 2024-11-25 DIAGNOSIS — Z0000 Encounter for general adult medical examination without abnormal findings: Secondary | ICD-10-CM | POA: Diagnosis not present

## 2024-11-25 NOTE — Progress Notes (Signed)
 " I connected with  Alec Wilson on 11/25/2024 by a audio enabled telemedicine application and verified that I am speaking with the correct person using two identifiers.  Patient Location: Home  Provider Location: Home Office  Persons Participating in Visit: Patient.  I discussed the limitations of evaluation and management by telemedicine. The patient expressed understanding and agreed to proceed.  Vital Signs: Because this visit was a virtual/telehealth visit, some criteria may be missing or patient reported. Any vitals not documented were not able to be obtained and vitals that have been documented are patient reported.  Chief Complaint  Patient presents with   Medicare Wellness     Subjective:   Alec Wilson is a 74 y.o. male who presents for a Medicare Annual Wellness Visit.  Visit info / Clinical Intake: Medicare Wellness Visit Type:: Subsequent Annual Wellness Visit Persons participating in visit and providing information:: patient Medicare Wellness Visit Mode:: Telephone If telephone:: video declined Since this visit was completed virtually, some vitals may be partially provided or unavailable. Missing vitals are due to the limitations of the virtual format.: Documented vitals are patient reported If Telephone or Video please confirm:: I connected with patient using audio/video enable telemedicine. I verified patient identity with two identifiers, discussed telehealth limitations, and patient agreed to proceed. Patient Location:: home Provider Location:: home office Interpreter Needed?: No Pre-visit prep was completed: yes AWV questionnaire completed by patient prior to visit?: yes Date:: 11/23/24 Living arrangements:: (Patient-Rptd) lives with spouse/significant other Patient's Overall Health Status Rating: (Patient-Rptd) excellent Typical amount of pain: (Patient-Rptd) some Does pain affect daily life?: (Patient-Rptd) no Are you currently prescribed opioids?:  no  Dietary Habits and Nutritional Risks How many meals a day?: (Patient-Rptd) 3 Eats fruit and vegetables daily?: (Patient-Rptd) yes Most meals are obtained by: (Patient-Rptd) preparing own meals In the last 2 weeks, have you had any of the following?: none Diabetic:: no  Functional Status Activities of Daily Living (to include ambulation/medication): (Patient-Rptd) Independent Ambulation: (Patient-Rptd) Independent Medication Administration: (Patient-Rptd) Independent Home Management (perform basic housework or laundry): (Patient-Rptd) Independent Manage your own finances?: (Patient-Rptd) yes Primary transportation is: (Patient-Rptd) driving Concerns about vision?: no *vision screening is required for WTM* Concerns about hearing?: no  Fall Screening Falls in the past year?: (Patient-Rptd) 0 Number of falls in past year: 0 Was there an injury with Fall?: 0 Fall Risk Category Calculator: 0 Patient Fall Risk Level: Low Fall Risk  Fall Risk Patient at Risk for Falls Due to: No Fall Risks Fall risk Follow up: Falls evaluation completed  Home and Transportation Safety: All rugs have non-skid backing?: (Patient-Rptd) yes All stairs or steps have railings?: (Patient-Rptd) N/A, no stairs Grab bars in the bathtub or shower?: (Patient-Rptd) yes Have non-skid surface in bathtub or shower?: (Patient-Rptd) yes Good home lighting?: (Patient-Rptd) yes Regular seat belt use?: (Patient-Rptd) yes Hospital stays in the last year:: (Patient-Rptd) no  Cognitive Assessment Difficulty concentrating, remembering, or making decisions? : (Patient-Rptd) no Will 6CIT or Mini Cog be Completed: yes What year is it?: 0 points What month is it?: 0 points Give patient an address phrase to remember (5 components): remember words table, apple , penny About what time is it?: 0 points Count backwards from 20 to 1: 0 points Say the months of the year in reverse: 0 points Repeat the address phrase from  earlier: 0 points 6 CIT Score: 0 points  Advance Directives (For Healthcare) Does Patient Have a Medical Advance Directive?: Yes Does patient want to make  changes to medical advance directive?: No - Patient declined Type of Advance Directive: Healthcare Power of [place]; Living will Copy of Healthcare Power of Attorney in Chart?: No - copy requested Copy of Living Will in Chart?: No - copy requested  Reviewed/Updated  Reviewed/Updated: Reviewed All (Medical, Surgical, Family, Medications, Allergies, Care Teams, Patient Goals)    Allergies (verified) Patient has no known allergies.   Current Medications (verified) Outpatient Encounter Medications as of 11/25/2024  Medication Sig   aspirin 81 MG tablet Take 81 mg by mouth daily.   clobetasol ointment (TEMOVATE) 0.05 % Apply topically.   COMIRNATY syringe    fish oil-omega-3 fatty acids 1000 MG capsule Take 1 g by mouth daily.   FLUZONE HIGH-DOSE 0.5 ML injection Inject 0.5 mLs into the muscle once.   losartan  (COZAAR ) 25 MG tablet TAKE 1 TABLET BY MOUTH EVERY DAY FOR BLOOD PRESSURE   Multiple Vitamins-Minerals (MULTIVITAMIN PO) Take 1 Centrum Silver multivitamin by mouth daily   simvastatin  (ZOCOR ) 20 MG tablet TAKE 1 TABLET DAILY IN EVENING FOR CHOLESTEROL   amoxicillin -clavulanate (AUGMENTIN ) 875-125 MG tablet Take 1 tablet by mouth 2 (two) times daily. (Patient not taking: Reported on 11/25/2024)   doxycycline  (VIBRA -TABS) 100 MG tablet Take 1 tablet (100 mg total) by mouth 2 (two) times daily. (Patient not taking: Reported on 11/25/2024)   No facility-administered encounter medications on file as of 11/25/2024.    History: Past Medical History:  Diagnosis Date   Acute viral sinusitis 12/14/2018   Adenomatous colon polyp    last colonoscopy was 3 years ago.   Chest pressure 10/07/2019   Diverticulitis    Diverticulosis    Heart murmur 1967   Hemorrhoids summer 2005   s/p ligation of internal hemorrhoids   Hernia, inguinal,  left 10/01/2018   Hernia, inguinal-right 01/22/2013   History of bilateral inguinal hernia repair 04/11/2022   HTN (hypertension)    Hyperlipidemia    Inguinal hernia    Internal hemorrhoids with bleeding and prolapse 02/15/2013   Irregular heart rhythm    Stress ECHO-Negative (09/22/98)   PAF (paroxysmal atrial fibrillation) (HCC) 1999   Stress ECHO-Negative (09/22/98) -> no further documented evidence of A. fib   Rash and nonspecific skin eruption 10/01/2018   [facility] spotted fever) 05/2004   Sore throat 09/08/2021   Tubular adenoma of colon    Past Surgical History:  Procedure Laterality Date   CARDIAC EVENT MONITOR  10/2019   Baseline sample showed sinus rhythm with average rate 79 bpm.  Minimum heart rate 48 bpm.  Maximum heart rate sinus tachycardia 159 bpm. <1% PVCs.  No A. fib.  Bradycardia was noted during sleeping hours.  Patient had one episode of sensation of fluttering or skipped beats during which she had a short run of sinus rhythm with PVCs in bigeminy.   CATARACT EXTRACTION W/PHACO Left 12/06/2022   Procedure: CATARACT EXTRACTION PHACO AND INTRAOCULAR LENS PLACEMENT (IOC) LEFT  4.92  00:43.0;  Surgeon: Jaye Fallow, MD;  Location: [facility];  Service: Ophthalmology;  Laterality: Left;   CATARACT EXTRACTION W/PHACO Right 12/20/2022   Procedure: CATARACT EXTRACTION PHACO AND INTRAOCULAR LENS PLACEMENT (IOC) RIGHT 4.05 00:26.7;  Surgeon: Jaye Fallow, MD;  Location: [facility];  Service: Ophthalmology;  Laterality: Right;   COLONOSCOPY  2013   HEMORRHOID SURGERY  2005   INGUINAL HERNIA REPAIR Right 2019   NM MYOVIEW  LTD  11/2019   EF 60%..  Medium size, mild severity fixed apical defect with normal wall  motion consistent with artifact.  LOW Risk   THROAT SURGERY  childhood   TRANSTHORACIC ECHOCARDIOGRAM  12/04/2019    EF 60 to 65%.  No R WMA.  Mild basal septal hypertrophy.  Normal RV function but mildly increased size.  Normal  atrial sizes.  Mild aortic sclerosis, no stenosis.   VASECTOMY     Family History  Problem Relation Age of Onset   Coronary artery disease Father 74       CABG at age 96, MI at 25.   Parkinson's disease Father    Colon polyps Father    Congestive Heart Failure Father    Heart disease Father    Hypertension Brother    Hypertension Mother    Arthritis Mother    Heart attack Mother 36   Heart disease Mother    Cancer Maternal Grandmother    Cancer Maternal Grandfather    Heart attack Paternal Grandfather 78   Hypertension Brother    Rectal cancer Neg Hx    Stomach cancer Neg Hx    Colon cancer Neg Hx    Esophageal cancer Neg Hx    Social History   Occupational History   Occupation: retired  Tobacco Use   Smoking status: Former    Current packs/day: 0.00    Average packs/day: 1 pack/day for 15.0 years (15.0 ttl pk-yrs)    Types: Cigarettes    Quit date: 11/21/1993    Years since quitting: 31.0   Smokeless tobacco: Former    Quit date: 02/19/1998  Vaping Use   Vaping status: Never Used  Substance and Sexual Activity   Alcohol use: Yes    Alcohol/week: 2.0 standard drinks of alcohol    Types: 2 Glasses of wine per week    Comment: a glass of wine weekly   Drug use: Never   Sexual activity: Yes    Birth control/protection: Surgical   Tobacco Counseling Counseling given: Not Answered  SDOH Screenings   Food Insecurity: No Food Insecurity (11/23/2024)  Housing: Low Risk (11/23/2024)  Transportation Needs: No Transportation Needs (11/23/2024)  Utilities: Not At Risk (11/25/2024)  Alcohol Screen: Low Risk (11/23/2024)  Depression (PHQ2-9): Low Risk (11/25/2024)  Financial Resource Strain: Low Risk (11/23/2024)  Physical Activity: Sufficiently Active (11/23/2024)  Social Connections: Socially Integrated (11/23/2024)  Stress: No Stress Concern Present (11/23/2024)  Tobacco Use: Medium Risk (11/25/2024)  Health Literacy: Adequate Health Literacy (11/25/2024)   See flowsheets for full  screening details  Depression Screen PHQ 2 & 9 Depression Scale- Over the past 2 weeks, how often have you been bothered by any of the following problems? Little interest or pleasure in doing things: 0 Feeling down, depressed, or hopeless (PHQ Adolescent also includes...irritable): 0 PHQ-2 Total Score: 0 Trouble falling or staying asleep, or sleeping too much: 0 Feeling tired or having little energy: 0 Poor appetite or overeating (PHQ Adolescent also includes...weight loss): 0 Feeling bad about yourself - or that you are a failure or have let yourself or your family down: 0 Trouble concentrating on things, such as reading the newspaper or watching television (PHQ Adolescent also includes...like school work): 0 Moving or speaking so slowly that other people could have noticed. Or the opposite - being so fidgety or restless that you have been moving around a lot more than usual: 0 Thoughts that you would be better off dead, or of hurting yourself in some way: 0 PHQ-9 Total Score: 0 If you checked off any problems, how difficult have these problems made it  for you to do your work, take care of things at home, or get along with other people?: Not difficult at all  Depression Treatment Depression Interventions/Treatment : [id number] Score <4 Follow-up Not Indicated     Goals Addressed               This Visit's Progress     Patient Stated (pt-stated)        Patient states he will like to sleep more              Objective:    Today's Vitals   11/25/24 0955  BP: 118/68  Weight: 177 lb (80.3 kg)  Height: 5' 9 (1.753 m)   Body mass index is 26.14 kg/m.  Hearing/Vision screen Hearing Screening - Comments:: No difficulties  Vision Screening - Comments:: Patient wears glasses. DR. Elliott. [facility]  Immunizations and Health Maintenance Health Maintenance  Topic Date Due   Medicare Annual Wellness (AWV)  11/27/2024   COVID-19 Vaccine (8 - 2025-26 season) 02/24/2025    Colonoscopy  06/21/2029   Pneumococcal Vaccine: 50+ Years  Completed   Influenza Vaccine  Completed   Hepatitis C Screening  Completed   Zoster Vaccines- Shingrix  Completed   Meningococcal B Vaccine  Aged Out   DTaP/Tdap/Td  Discontinued        Assessment/Plan:  This is a routine wellness examination for Alec Wilson.  Patient Care Team: Gretta Comer POUR, NP as PCP - General (Nurse Practitioner) Avram Lupita BRAVO, MD (Gastroenterology) Tisa Bars, MD (Inactive) (Cardiology) Jaye Fallow, MD as Referring Physician (Ophthalmology)  I have personally reviewed and noted the following in the patients chart:   Medical and social history Use of alcohol, tobacco or illicit drugs  Current medications and supplements including opioid prescriptions. Functional ability and status Nutritional status Physical activity Advanced directives List of other physicians Hospitalizations, surgeries, and ER visits in previous 12 months Vitals Screenings to include cognitive, depression, and falls Referrals and appointments  No orders of the defined types were placed in this encounter.  In addition, I have reviewed and discussed with patient certain preventive protocols, quality metrics, and best practice recommendations. A written personalized care plan for preventive services as well as general preventive health recommendations were provided to patient.   Lyle POUR Right, [place]   11/25/2024   No follow-ups on file.  After Visit Summary: (MyChart) Due to this being a telephonic visit, the after visit summary with patients personalized plan was offered to patient via MyChart   No voiced or noted concerns at this time Vaccines not given: covid vaccine  declined today  "

## 2024-11-25 NOTE — Patient Instructions (Signed)
 Mr. Alec Wilson,  Thank you for taking the time for your Medicare Wellness Visit. I appreciate your continued commitment to your health goals. Please review the care plan we discussed, and feel free to reach out if I can assist you further.  Please note that Annual Wellness Visits do not include a physical exam. Some assessments may be limited, especially if the visit was conducted virtually. If needed, we may recommend an in-person follow-up with your provider.  Ongoing Care Seeing your primary care provider every 3 to 6 months helps us  monitor your health and provide consistent, personalized care.   Referrals If a referral was made during today's visit and you haven't received any updates within two weeks, please contact the referred provider directly to check on the status.  Recommended Screenings:  Health Maintenance  Topic Date Due   Medicare Annual Wellness Visit  11/27/2024   COVID-19 Vaccine (8 - 2025-26 season) 02/24/2025   Colon Cancer Screening  06/21/2029   Pneumococcal Vaccine for age over 40  Completed   Flu Shot  Completed   Hepatitis C Screening  Completed   Zoster (Shingles) Vaccine  Completed   Meningitis B Vaccine  Aged Out   DTaP/Tdap/Td vaccine  Discontinued       11/23/2024   10:12 AM  Advanced Directives  Does Patient Have a Medical Advance Directive? Yes  Type of Estate Agent of [place];Living will  Does patient want to make changes to medical advance directive? No - Patient declined  Copy of Healthcare Power of Attorney in Chart? No - copy requested    Vision: Annual vision screenings are recommended for early detection of glaucoma, cataracts, and diabetic retinopathy. These exams can also reveal signs of chronic conditions such as diabetes and high blood pressure.  Dental: Annual dental screenings help detect early signs of oral cancer, gum disease, and other conditions linked to overall health, including heart disease and  diabetes.  Please see the attached documents for additional preventive care recommendations.

## 2024-11-28 ENCOUNTER — Ambulatory Visit: Payer: PPO

## 2024-11-30 ENCOUNTER — Other Ambulatory Visit: Payer: Self-pay | Admitting: Primary Care

## 2024-11-30 DIAGNOSIS — E785 Hyperlipidemia, unspecified: Secondary | ICD-10-CM

## 2024-11-30 DIAGNOSIS — I10 Essential (primary) hypertension: Secondary | ICD-10-CM

## 2025-01-10 ENCOUNTER — Encounter: Admitting: Primary Care

## 2025-11-28 ENCOUNTER — Ambulatory Visit
